# Patient Record
Sex: Female | Born: 1940
Health system: Southern US, Community
[De-identification: ages and names within clinical notes are randomized; demographics above are authoritative.]

## PROBLEM LIST (undated history)

## (undated) DIAGNOSIS — I10 Essential (primary) hypertension: Secondary | ICD-10-CM

## (undated) DIAGNOSIS — K219 Gastro-esophageal reflux disease without esophagitis: Secondary | ICD-10-CM

## (undated) DIAGNOSIS — M1990 Unspecified osteoarthritis, unspecified site: Secondary | ICD-10-CM

## (undated) DIAGNOSIS — E7800 Pure hypercholesterolemia, unspecified: Secondary | ICD-10-CM

## (undated) DIAGNOSIS — R109 Unspecified abdominal pain: Secondary | ICD-10-CM

## (undated) HISTORY — PX: ABDOMINAL HYSTERECTOMY: SHX81

## (undated) HISTORY — PX: APPENDECTOMY: SHX54

---

## 1998-03-07 ENCOUNTER — Ambulatory Visit: Admission: RE | Admit: 1998-03-07 | Discharge: 1998-03-07 | Payer: Self-pay | Admitting: Obstetrics and Gynecology

## 2000-02-01 ENCOUNTER — Encounter: Payer: Self-pay | Admitting: Emergency Medicine

## 2000-02-01 ENCOUNTER — Emergency Department (HOSPITAL_COMMUNITY): Admission: EM | Admit: 2000-02-01 | Discharge: 2000-02-01 | Payer: Self-pay | Admitting: Emergency Medicine

## 2000-02-27 ENCOUNTER — Encounter: Payer: Self-pay | Admitting: Obstetrics and Gynecology

## 2000-02-27 ENCOUNTER — Encounter: Admission: RE | Admit: 2000-02-27 | Discharge: 2000-02-27 | Payer: Self-pay | Admitting: Obstetrics and Gynecology

## 2000-04-29 ENCOUNTER — Encounter: Payer: Self-pay | Admitting: Obstetrics and Gynecology

## 2000-04-29 ENCOUNTER — Encounter: Admission: RE | Admit: 2000-04-29 | Discharge: 2000-04-29 | Payer: Self-pay | Admitting: Obstetrics and Gynecology

## 2000-12-05 ENCOUNTER — Emergency Department (HOSPITAL_COMMUNITY): Admission: EM | Admit: 2000-12-05 | Discharge: 2000-12-05 | Payer: Self-pay | Admitting: *Deleted

## 2000-12-05 ENCOUNTER — Encounter: Payer: Self-pay | Admitting: *Deleted

## 2001-05-12 ENCOUNTER — Encounter: Admission: RE | Admit: 2001-05-12 | Discharge: 2001-05-12 | Payer: Self-pay | Admitting: Obstetrics and Gynecology

## 2001-05-12 ENCOUNTER — Encounter: Payer: Self-pay | Admitting: Obstetrics and Gynecology

## 2001-11-27 ENCOUNTER — Other Ambulatory Visit: Admission: RE | Admit: 2001-11-27 | Discharge: 2001-11-27 | Payer: Self-pay | Admitting: Family Medicine

## 2002-05-11 ENCOUNTER — Encounter: Admission: RE | Admit: 2002-05-11 | Discharge: 2002-05-11 | Payer: Self-pay | Admitting: Family Medicine

## 2002-05-11 ENCOUNTER — Encounter: Payer: Self-pay | Admitting: Family Medicine

## 2003-05-19 ENCOUNTER — Encounter: Payer: Self-pay | Admitting: Family Medicine

## 2003-05-19 ENCOUNTER — Encounter: Admission: RE | Admit: 2003-05-19 | Discharge: 2003-05-19 | Payer: Self-pay | Admitting: Family Medicine

## 2004-03-15 ENCOUNTER — Emergency Department (HOSPITAL_COMMUNITY): Admission: EM | Admit: 2004-03-15 | Discharge: 2004-03-15 | Payer: Self-pay

## 2004-05-22 ENCOUNTER — Encounter: Admission: RE | Admit: 2004-05-22 | Discharge: 2004-05-22 | Payer: Self-pay | Admitting: Family Medicine

## 2005-06-04 ENCOUNTER — Encounter: Admission: RE | Admit: 2005-06-04 | Discharge: 2005-06-04 | Payer: Self-pay | Admitting: Family Medicine

## 2006-07-01 ENCOUNTER — Encounter: Admission: RE | Admit: 2006-07-01 | Discharge: 2006-07-01 | Payer: Self-pay | Admitting: Family Medicine

## 2007-07-03 ENCOUNTER — Encounter: Admission: RE | Admit: 2007-07-03 | Discharge: 2007-07-03 | Payer: Self-pay | Admitting: Family Medicine

## 2008-07-04 ENCOUNTER — Encounter: Admission: RE | Admit: 2008-07-04 | Discharge: 2008-07-04 | Payer: Self-pay | Admitting: Family Medicine

## 2009-07-05 ENCOUNTER — Encounter: Admission: RE | Admit: 2009-07-05 | Discharge: 2009-07-05 | Payer: Self-pay | Admitting: Family Medicine

## 2010-07-11 ENCOUNTER — Encounter: Admission: RE | Admit: 2010-07-11 | Discharge: 2010-07-11 | Payer: Self-pay | Admitting: Family Medicine

## 2011-03-22 NOTE — H&P (Signed)
NAME:  Kelly Farmer, Kelly Farmer                        ACCOUNT NO.:  [id number]   MEDICAL RECORD NO.:  [id number]                   PATIENT TYPE:  EMS   LOCATION:  [facility]                                 FACILITY:  [facility]   PHYSICIAN:  Pricilla Riffle, M.D.                 DATE OF BIRTH:  01-03-1941   DATE OF ADMISSION:  03/15/2004  DATE OF DISCHARGE:                                HISTORY & PHYSICAL   IDENTIFICATION:  Kelly Farmer is a 70 year old with no history of coronary  artery disease who presented to the emergency room for evaluation of chest  pain.   Yesterday, the patient noted the onset of left-sided chest pain.  Denies any  precipitating factors, no extraordinary activity.  It lasted several  minutes, waxed and waned throughout the day.  Nonpleuritic, question mildly  positional, some soreness in right arm.  No real again extraordinary changes  in her daily activities prior.  Slept okay last night, this morning  continued to have intermittent pain and came to the emergency room.   The patient denies any change in her ability to do things.  No increased  dyspnea.   MEDICATIONS:  1. The patient is on Zocor, question 10 mg.  2. Hydrochlorothiazide, question dose.  3. Another blood pressure medicine.  4. Diabetes medicine x2.  5. Vitamins.  6. Aspirin daily.   PAST MEDICAL HISTORY:  1. Diabetes x13 years.  2. Hypertension x2 years.  3. Cholesterol diagnosed and treated for one year.   FAMILY HISTORY:  Coronary artery disease in her father.   SOCIAL HISTORY:  The patient works at [employer].  She quit tobacco years ago,  does not drink.   REVIEW OF SYSTEMS:  Denies fevers, chills.  No cough, no nausea, no  neuropathy from diabetes.  Sugars have been higher now since her medicines  changed.   PHYSICAL EXAMINATION:  GENERAL:  The patient is in no acute distress.  Initially notes chest pain on arrival but then it waxed to 0/10.  VITAL SIGNS:  Blood pressure 112/80, pulse of 70.  The  patient is afebrile.  NECK:  JVP is normal.  No bruits.  LUNGS:  Clear.  CARDIAC:  Regular rate and rhythm.  S1, S2.  No S3, S4, or murmurs or rubs.  CHEST:  Tender on palpation of the left upper parasternal area.  This is the  pain she came in for.  ABDOMEN:  Benign.  EXTREMITIES:  No edema, 2+ pulses.   LABORATORY DATA:  Significant for a hemoglobin of 12.3, WBC of 6.4, BUN and  creatinine of 12 and 0.7, potassium 3.8.  Myoglobin is 54, 54, 45.  MB  1.41/1.2.  Troponin less than 0.05/less than 0.05/less than 0.03.   A 12-lead EKG showed sinus rhythm at a rate of 85 beats per minute  Pricilla Riffle, M.D.    PVR/MEDQ  D:  03/15/2004  T:  03/15/2004  Job:  [id number]

## 2011-06-25 ENCOUNTER — Other Ambulatory Visit: Payer: Self-pay | Admitting: Family Medicine

## 2011-06-25 DIAGNOSIS — Z1231 Encounter for screening mammogram for malignant neoplasm of breast: Secondary | ICD-10-CM

## 2011-06-25 DIAGNOSIS — Z803 Family history of malignant neoplasm of breast: Secondary | ICD-10-CM

## 2011-07-22 ENCOUNTER — Ambulatory Visit: Payer: Self-pay

## 2011-07-31 ENCOUNTER — Ambulatory Visit
Admission: RE | Admit: 2011-07-31 | Discharge: 2011-07-31 | Disposition: A | Discharge status: Home / Self Care | Payer: Medicare Other | Source: Ambulatory Visit | Attending: Family Medicine | Admitting: Family Medicine

## 2011-07-31 DIAGNOSIS — Z803 Family history of malignant neoplasm of breast: Secondary | ICD-10-CM

## 2011-07-31 DIAGNOSIS — Z1231 Encounter for screening mammogram for malignant neoplasm of breast: Secondary | ICD-10-CM

## 2012-01-27 ENCOUNTER — Ambulatory Visit (HOSPITAL_COMMUNITY)
Admission: RE | Admit: 2012-01-27 | Discharge: 2012-01-27 | Disposition: A | Discharge status: Home / Self Care | Payer: Medicare Other | Source: Ambulatory Visit | Attending: Family Medicine | Admitting: Family Medicine

## 2012-01-27 ENCOUNTER — Other Ambulatory Visit (HOSPITAL_COMMUNITY): Payer: Self-pay | Admitting: Family Medicine

## 2012-01-27 DIAGNOSIS — E119 Type 2 diabetes mellitus without complications: Secondary | ICD-10-CM | POA: Insufficient documentation

## 2012-01-27 DIAGNOSIS — I10 Essential (primary) hypertension: Secondary | ICD-10-CM | POA: Diagnosis not present

## 2012-01-27 DIAGNOSIS — R112 Nausea with vomiting, unspecified: Secondary | ICD-10-CM | POA: Diagnosis not present

## 2012-01-27 DIAGNOSIS — R1011 Right upper quadrant pain: Secondary | ICD-10-CM

## 2012-02-10 DIAGNOSIS — K5732 Diverticulitis of large intestine without perforation or abscess without bleeding: Secondary | ICD-10-CM | POA: Diagnosis not present

## 2012-02-13 ENCOUNTER — Emergency Department (HOSPITAL_COMMUNITY): Payer: Medicare Other

## 2012-02-13 ENCOUNTER — Encounter (HOSPITAL_COMMUNITY): Payer: Self-pay | Admitting: *Deleted

## 2012-02-13 ENCOUNTER — Inpatient Hospital Stay (HOSPITAL_COMMUNITY)
Admission: EM | Admit: 2012-02-13 | Discharge: 2012-02-21 | DRG: 445 | Disposition: A | Discharge status: Home Health | Payer: Medicare Other | Attending: Surgery | Admitting: Surgery

## 2012-02-13 DIAGNOSIS — K651 Peritoneal abscess: Secondary | ICD-10-CM | POA: Diagnosis present

## 2012-02-13 DIAGNOSIS — R05 Cough: Secondary | ICD-10-CM | POA: Diagnosis not present

## 2012-02-13 DIAGNOSIS — R110 Nausea: Secondary | ICD-10-CM | POA: Diagnosis not present

## 2012-02-13 DIAGNOSIS — K5730 Diverticulosis of large intestine without perforation or abscess without bleeding: Secondary | ICD-10-CM | POA: Diagnosis not present

## 2012-02-13 DIAGNOSIS — K828 Other specified diseases of gallbladder: Secondary | ICD-10-CM | POA: Diagnosis not present

## 2012-02-13 DIAGNOSIS — N179 Acute kidney failure, unspecified: Secondary | ICD-10-CM | POA: Diagnosis present

## 2012-02-13 DIAGNOSIS — K632 Fistula of intestine: Secondary | ICD-10-CM | POA: Diagnosis present

## 2012-02-13 DIAGNOSIS — R059 Cough, unspecified: Secondary | ICD-10-CM | POA: Diagnosis not present

## 2012-02-13 DIAGNOSIS — R109 Unspecified abdominal pain: Secondary | ICD-10-CM | POA: Diagnosis not present

## 2012-02-13 DIAGNOSIS — M7989 Other specified soft tissue disorders: Secondary | ICD-10-CM | POA: Diagnosis not present

## 2012-02-13 DIAGNOSIS — R112 Nausea with vomiting, unspecified: Secondary | ICD-10-CM | POA: Diagnosis present

## 2012-02-13 DIAGNOSIS — E785 Hyperlipidemia, unspecified: Secondary | ICD-10-CM | POA: Diagnosis present

## 2012-02-13 DIAGNOSIS — N289 Disorder of kidney and ureter, unspecified: Secondary | ICD-10-CM | POA: Diagnosis not present

## 2012-02-13 DIAGNOSIS — R5381 Other malaise: Secondary | ICD-10-CM | POA: Diagnosis not present

## 2012-02-13 DIAGNOSIS — K810 Acute cholecystitis: Principal | ICD-10-CM

## 2012-02-13 DIAGNOSIS — E876 Hypokalemia: Secondary | ICD-10-CM

## 2012-02-13 DIAGNOSIS — E119 Type 2 diabetes mellitus without complications: Secondary | ICD-10-CM | POA: Diagnosis not present

## 2012-02-13 DIAGNOSIS — K659 Peritonitis, unspecified: Secondary | ICD-10-CM | POA: Diagnosis not present

## 2012-02-13 DIAGNOSIS — L03319 Cellulitis of trunk, unspecified: Secondary | ICD-10-CM | POA: Diagnosis not present

## 2012-02-13 DIAGNOSIS — IMO0001 Reserved for inherently not codable concepts without codable children: Secondary | ICD-10-CM | POA: Diagnosis not present

## 2012-02-13 DIAGNOSIS — D72829 Elevated white blood cell count, unspecified: Secondary | ICD-10-CM | POA: Diagnosis not present

## 2012-02-13 DIAGNOSIS — E860 Dehydration: Secondary | ICD-10-CM | POA: Diagnosis not present

## 2012-02-13 DIAGNOSIS — R1110 Vomiting, unspecified: Secondary | ICD-10-CM | POA: Diagnosis not present

## 2012-02-13 DIAGNOSIS — A498 Other bacterial infections of unspecified site: Secondary | ICD-10-CM | POA: Diagnosis present

## 2012-02-13 DIAGNOSIS — I10 Essential (primary) hypertension: Secondary | ICD-10-CM | POA: Diagnosis present

## 2012-02-13 DIAGNOSIS — R1115 Cyclical vomiting syndrome unrelated to migraine: Secondary | ICD-10-CM | POA: Diagnosis not present

## 2012-02-13 DIAGNOSIS — R5383 Other fatigue: Secondary | ICD-10-CM | POA: Diagnosis not present

## 2012-02-13 DIAGNOSIS — K311 Adult hypertrophic pyloric stenosis: Secondary | ICD-10-CM | POA: Diagnosis not present

## 2012-02-13 DIAGNOSIS — L02219 Cutaneous abscess of trunk, unspecified: Secondary | ICD-10-CM | POA: Diagnosis not present

## 2012-02-13 DIAGNOSIS — K829 Disease of gallbladder, unspecified: Secondary | ICD-10-CM | POA: Diagnosis not present

## 2012-02-13 DIAGNOSIS — R197 Diarrhea, unspecified: Secondary | ICD-10-CM

## 2012-02-13 HISTORY — DX: Unspecified osteoarthritis, unspecified site: M19.90

## 2012-02-13 HISTORY — DX: Pure hypercholesterolemia, unspecified: E78.00

## 2012-02-13 HISTORY — DX: Essential (primary) hypertension: I10

## 2012-02-13 LAB — TROPONIN I: Troponin I: <0.3 ng/mL (ref <0.30)

## 2012-02-13 LAB — URINALYSIS, ROUTINE W REFLEX MICROSCOPIC
Glucose, UA: NEGATIVE mg/dL
Hgb urine dipstick: NEGATIVE
Ketones, ur: 15 mg/dL — AB
Nitrite: NEGATIVE
Protein, ur: 30 mg/dL — AB
Specific Gravity, Urine: 1.024 (ref 1.005–1.030)
Urobilinogen, UA: 1 mg/dL (ref 0.0–1.0)
pH: 5.5 (ref 5.0–8.0)

## 2012-02-13 LAB — GLUCOSE, CAPILLARY
Glucose-Capillary: 100 mg/dL — ABNORMAL HIGH (ref 70–99)
Glucose-Capillary: 106 mg/dL — ABNORMAL HIGH (ref 70–99)
Glucose-Capillary: 167 mg/dL — ABNORMAL HIGH (ref 70–99)

## 2012-02-13 LAB — CBC
HCT: 31.3 % — ABNORMAL LOW (ref 36.0–46.0)
Hemoglobin: 10.1 g/dL — ABNORMAL LOW (ref 12.0–15.0)
MCH: 25.7 pg — ABNORMAL LOW (ref 26.0–34.0)
MCHC: 32.3 g/dL (ref 30.0–36.0)
MCV: 79.6 fL (ref 78.0–100.0)
Platelets: 652 10*3/uL — ABNORMAL HIGH (ref 150–400)
RBC: 3.93 MIL/uL (ref 3.87–5.11)
RDW: 15.9 % — ABNORMAL HIGH (ref 11.5–15.5)
WBC: 10.6 10*3/uL — ABNORMAL HIGH (ref 4.0–10.5)

## 2012-02-13 LAB — DIFFERENTIAL
Basophils Absolute: 0 10*3/uL (ref 0.0–0.1)
Basophils Relative: 0 % (ref 0–1)
Eosinophils Absolute: 0 10*3/uL (ref 0.0–0.7)
Eosinophils Relative: 0 % (ref 0–5)
Lymphocytes Relative: 12 % (ref 12–46)
Lymphs Abs: 1.3 10*3/uL (ref 0.7–4.0)
Monocytes Absolute: 1.7 10*3/uL — ABNORMAL HIGH (ref 0.1–1.0)
Monocytes Relative: 16 % — ABNORMAL HIGH (ref 3–12)
Neutro Abs: 7.6 10*3/uL (ref 1.7–7.7)
Neutrophils Relative %: 71 % (ref 43–77)

## 2012-02-13 LAB — URINE MICROSCOPIC-ADD ON

## 2012-02-13 LAB — COMPREHENSIVE METABOLIC PANEL
ALT: 24 U/L (ref 0–35)
AST: 20 U/L (ref 0–37)
Albumin: 3 g/dL — ABNORMAL LOW (ref 3.5–5.2)
Alkaline Phosphatase: 140 U/L — ABNORMAL HIGH (ref 39–117)
BUN: 45 mg/dL — ABNORMAL HIGH (ref 6–23)
CO2: 38 mEq/L — ABNORMAL HIGH (ref 19–32)
Calcium: 9.5 mg/dL (ref 8.4–10.5)
Chloride: 79 mEq/L — ABNORMAL LOW (ref 96–112)
Creatinine, Ser: 3.74 mg/dL — ABNORMAL HIGH (ref 0.50–1.10)
GFR calc Af Amer: 13 mL/min — ABNORMAL LOW (ref >90)
GFR calc non Af Amer: 11 mL/min — ABNORMAL LOW (ref >90)
Glucose, Bld: 208 mg/dL — ABNORMAL HIGH (ref 70–99)
Potassium: 2.7 mEq/L — CL (ref 3.5–5.1)
Sodium: 138 mEq/L (ref 135–145)
Total Bilirubin: 0.6 mg/dL (ref 0.3–1.2)
Total Protein: 8.1 g/dL (ref 6.0–8.3)

## 2012-02-13 LAB — LIPASE, BLOOD: Lipase: 42 U/L (ref 11–59)

## 2012-02-13 LAB — PROCALCITONIN: Procalcitonin: 0.47 ng/mL

## 2012-02-13 LAB — LACTIC ACID, PLASMA: Lactic Acid, Venous: 2.4 mmol/L — ABNORMAL HIGH (ref 0.5–2.2)

## 2012-02-13 MED ORDER — SODIUM CHLORIDE 0.9 % IV SOLN
1.0000 g | Freq: Once | INTRAVENOUS | Status: AC
  Administered 2012-02-13: 1 g via INTRAVENOUS
  Filled 2012-02-13: qty 1

## 2012-02-13 MED ORDER — SODIUM CHLORIDE 0.9 % IV BOLUS (SEPSIS)
500.0000 mL | Freq: Once | INTRAVENOUS | Status: AC
  Administered 2012-02-14: 500 mL via INTRAVENOUS

## 2012-02-13 MED ORDER — WHITE PETROLATUM GEL
Status: AC
  Filled 2012-02-13: qty 5

## 2012-02-13 MED ORDER — ONDANSETRON HCL 4 MG/2ML IJ SOLN
4.0000 mg | INTRAMUSCULAR | Status: AC | PRN
  Administered 2012-02-13 – 2012-02-14 (×2): 4 mg via INTRAVENOUS
  Filled 2012-02-13 (×2): qty 2

## 2012-02-13 MED ORDER — INSULIN ASPART 100 UNIT/ML ~~LOC~~ SOLN
0.0000 [IU] | SUBCUTANEOUS | Status: DC
  Administered 2012-02-13: 20:00:00 via SUBCUTANEOUS
  Administered 2012-02-14 (×2): 1 [IU] via SUBCUTANEOUS
  Administered 2012-02-15 (×2): 2 [IU] via SUBCUTANEOUS
  Administered 2012-02-15: 1 [IU] via SUBCUTANEOUS
  Administered 2012-02-15: 3 [IU] via SUBCUTANEOUS
  Administered 2012-02-16: 1 [IU] via SUBCUTANEOUS
  Filled 2012-02-13: qty 1

## 2012-02-13 MED ORDER — SODIUM CHLORIDE 0.9 % IV SOLN
250.0000 mg | Freq: Two times a day (BID) | INTRAVENOUS | Status: DC
  Administered 2012-02-14: 250 mg via INTRAVENOUS
  Filled 2012-02-13 (×6): qty 250

## 2012-02-13 MED ORDER — POTASSIUM CHLORIDE 10 MEQ/100ML IV SOLN
10.0000 meq | INTRAVENOUS | Status: AC
  Administered 2012-02-13 – 2012-02-14 (×4): 10 meq via INTRAVENOUS
  Filled 2012-02-13 (×3): qty 100

## 2012-02-13 MED ORDER — SODIUM CHLORIDE 0.9 % IV SOLN
INTRAVENOUS | Status: DC
  Administered 2012-02-13: 22:00:00 via INTRAVENOUS
  Administered 2012-02-14: 1000 mL via INTRAVENOUS
  Administered 2012-02-14: 500 mL via INTRAVENOUS
  Administered 2012-02-15: 1000 mL via INTRAVENOUS

## 2012-02-13 NOTE — Progress Notes (Signed)
ANTIBIOTIC CONSULT NOTE - INITIAL  Pharmacy Consult for Primaxin Indication: perforated cholecystitis  No Known Allergies  Patient Measurements: Weight: 156 lb 3.2 oz (70.852 kg)  Vital Signs: Temp: 98 F (36.7 C) (04/11 1702) Temp src: Oral (04/11 1702) BP: 112/50 mmHg (04/11 1702) Pulse Rate: 92  (04/11 1702) Intake/Output from previous day:   Intake/Output from this shift:    Labs:  Basename 02/13/12 1415  WBC 10.6*  HGB 10.1*  PLT 652*  LABCREA --  CREATININE 3.74*   CrCl is unknown because there is no height on file for the current visit. No results found for this basename: VANCOTROUGH:2,VANCOPEAK:2,VANCORANDOM:2,GENTTROUGH:2,GENTPEAK:2,GENTRANDOM:2,TOBRATROUGH:2,TOBRAPEAK:2,TOBRARND:2,AMIKACINPEAK:2,AMIKACINTROU:2,AMIKACIN:2, in the last 72 hours   Microbiology: No results found for this or any previous visit (from the past 720 hour(s)).  Medical History: Past Medical History  Diagnosis Date  . Hypercholesteremia   . Diabetes mellitus   . Hypertension     Medications:  Scheduled:    . ertapenem  1 g Intravenous Once  . insulin aspart  0-9 Units Subcutaneous Q4H  . potassium chloride  10 mEq Intravenous Q1 Hr x 5   Infusions:    . sodium chloride     Assessment: 71 yo female with perforated cholecystitis to start Primaxin per pharmacy. Note that patient just got Invanz 1g at 1730. Patient with an estimated normalized CrCl = 16 ml/min/1.72m2  Goal of Therapy:  primaxin adjustment per renal function  Plan:  1. Start Primaxin 250mg  IV q12 for CrCl 6-20 ml/min/1.28m2 2. Monitor renal function for potential dose changes   [name], PharmD, BCPS pager [phone] 02/13/2012,5:51 PM

## 2012-02-13 NOTE — ED Notes (Signed)
Pt states "I been sick vomiting since 3/25, went to the walk-in clinic this morning, was supposed to go to the gastro doctor Monday but I said forget that, I'm just sick"

## 2012-02-13 NOTE — Consult Note (Addendum)
Requesting physician: Dr.STreck  Primary Care Physician: Sissy Hoff, MD, MD  Reason for consultation: Medical management   History of Present Illness: Kelly Farmer is a 70/F with h/o Dm, HTN presents to ER today wih a 2 week h/o nausea and vomiting associated with right sided abd pain. When her symptoms started the was seen by her PCP who ordered a RUQ USG which was normal. She continued to have persistent nausea and vomiting associated with intermittent right sided abdominal pain and very poor PO intake . CT in ER concerning for perforated cholecystitis with abscess.Per EDP will likely to  OR per [facility] today. [facility] consulted for medical evaluation. No h/o CAD, no h/o exertional Chest pain or Dyspnea  Allergies:  No Known Allergies    Past Medical History  Diagnosis Date  . Hypercholesteremia   . Diabetes mellitus   . Hypertension     Past Surgical History  Procedure Date  . Abdominal hysterectomy     Scheduled Meds:   . ertapenem  1 g Intravenous Once  . insulin aspart  0-9 Units Subcutaneous Q4H   Continuous Infusions:   . sodium chloride     PRN Meds:.ondansetron  Social History: lives at home with significant other, former smoker quit 30 years ago, no h/o ETOH use   No family history on file.  Review of Systems:  Constitutional: Denies fever, chills, diaphoresis, appetite change and fatigue.  HEENT: Denies photophobia, eye pain, redness, hearing loss, ear pain, congestion, sore throat, rhinorrhea, sneezing, mouth sores, trouble swallowing, neck pain, neck stiffness and tinnitus.   Respiratory: Denies SOB, DOE, cough, chest tightness,  and wheezing.   Cardiovascular: Denies chest pain, palpitations and leg swelling.  Gastrointestinal: Denies nausea, vomiting, abdominal pain, diarrhea, constipation, blood in stool and abdominal distention.  Genitourinary: Denies dysuria, urgency, frequency, hematuria, flank pain and difficulty urinating.    Musculoskeletal: Denies myalgias, back pain, joint swelling, arthralgias and gait problem.  Skin: Denies pallor, rash and wound.  Neurological: Denies dizziness, seizures, syncope, weakness, light-headedness, numbness and headaches.  Hematological: Denies adenopathy. Easy bruising, personal or family bleeding history  Psychiatric/Behavioral: Denies suicidal ideation, mood changes, confusion, nervousness, sleep disturbance and agitation   Physical Exam: Blood pressure 112/50, pulse 92, temperature 98 F (36.7 C), temperature source Oral, resp. rate 20, weight 70.852 kg (156 lb 3.2 oz), SpO2 96.00%. WGN:[id number] no distress HEENT: oral mucosa dry, no JVD CVS: S1S2/RRR, no m/r/g Abd: Soft, distended, mild diffuse tenderness increased in RUQ No rigidity or rebound, decreased BS Ext: no edema or clubbing  Labs on Admission:  Results for orders placed during the hospital encounter of 02/13/12 (from the past 48 hour(s))  CBC     Status: Abnormal   Collection Time   02/13/12  2:15 PM      Component Value Range Comment   WBC 10.6 (*) 4.0 - 10.5 (K/uL)    RBC 3.93  3.87 - 5.11 (MIL/uL)    Hemoglobin 10.1 (*) 12.0 - 15.0 (g/dL)    HCT 08.6 (*) 57.8 - 46.0 (%)    MCV 79.6  78.0 - 100.0 (fL)    MCH 25.7 (*) 26.0 - 34.0 (pg)    MCHC 32.3  30.0 - 36.0 (g/dL)    RDW 46.9 (*) 62.9 - 15.5 (%)    Platelets 652 (*) 150 - 400 (K/uL)   DIFFERENTIAL     Status: Abnormal   Collection Time   02/13/12  2:15 PM      Component Value Range Comment  Neutrophils Relative 71  43 - 77 (%)    Neutro Abs 7.6  1.7 - 7.7 (K/uL)    Lymphocytes Relative 12  12 - 46 (%)    Lymphs Abs 1.3  0.7 - 4.0 (K/uL)    Monocytes Relative 16 (*) 3 - 12 (%)    Monocytes Absolute 1.7 (*) 0.1 - 1.0 (K/uL)    Eosinophils Relative 0  0 - 5 (%)    Eosinophils Absolute 0.0  0.0 - 0.7 (K/uL)    Basophils Relative 0  0 - 1 (%)    Basophils Absolute 0.0  0.0 - 0.1 (K/uL)   COMPREHENSIVE METABOLIC PANEL     Status: Abnormal    Collection Time   02/13/12  2:15 PM      Component Value Range Comment   Sodium 138  135 - 145 (mEq/L)    Potassium 2.7 (*) 3.5 - 5.1 (mEq/L)    Chloride 79 (*) 96 - 112 (mEq/L)    CO2 38 (*) 19 - 32 (mEq/L)    Glucose, Bld 208 (*) 70 - 99 (mg/dL)    BUN 45 (*) 6 - 23 (mg/dL)    Creatinine, Ser 1.61 (*) 0.50 - 1.10 (mg/dL)    Calcium 9.5  8.4 - 10.5 (mg/dL)    Total Protein 8.1  6.0 - 8.3 (g/dL)    Albumin 3.0 (*) 3.5 - 5.2 (g/dL)    AST 20  0 - 37 (U/L)    ALT 24  0 - 35 (U/L)    Alkaline Phosphatase 140 (*) 39 - 117 (U/L)    Total Bilirubin 0.6  0.3 - 1.2 (mg/dL)    GFR calc non Af Amer 11 (*) >90 (mL/min)    GFR calc Af Amer 13 (*) >90 (mL/min)   LIPASE, BLOOD     Status: Normal   Collection Time   02/13/12  2:15 PM      Component Value Range Comment   Lipase 42  11 - 59 (U/L)   PROCALCITONIN     Status: Normal   Collection Time   02/13/12  2:15 PM      Component Value Range Comment   Procalcitonin 0.47     TROPONIN I     Status: Normal   Collection Time   02/13/12  2:15 PM      Component Value Range Comment   Troponin I <0.30  <0.30 (ng/mL)   LACTIC ACID, PLASMA     Status: Abnormal   Collection Time   02/13/12  2:33 PM      Component Value Range Comment   Lactic Acid, Venous 2.4 (*) 0.5 - 2.2 (mmol/L)   URINALYSIS, ROUTINE W REFLEX MICROSCOPIC     Status: Abnormal   Collection Time   02/13/12  3:06 PM      Component Value Range Comment   Color, Urine AMBER (*) YELLOW  BIOCHEMICALS MAY BE AFFECTED BY COLOR   APPearance TURBID (*) CLEAR     Specific Gravity, Urine 1.024  1.005 - 1.030     pH 5.5  5.0 - 8.0     Glucose, UA NEGATIVE  NEGATIVE (mg/dL)    Hgb urine dipstick NEGATIVE  NEGATIVE     Bilirubin Urine LARGE (*) NEGATIVE     Ketones, ur 15 (*) NEGATIVE (mg/dL)    Protein, ur 30 (*) NEGATIVE (mg/dL)    Urobilinogen, UA 1.0  0.0 - 1.0 (mg/dL)    Nitrite NEGATIVE  NEGATIVE     Leukocytes, UA  TRACE (*) NEGATIVE    URINE MICROSCOPIC-ADD ON     Status: Abnormal    Collection Time   02/13/12  3:06 PM      Component Value Range Comment   Squamous Epithelial / LPF RARE  RARE     WBC, UA 0-2  <3 (WBC/hpf)    RBC / HPF 0-2  <3 (RBC/hpf)    Bacteria, UA MANY (*) RARE     Casts HYALINE CASTS (*) NEGATIVE      Radiological Exams on Admission: Ct Abdomen Pelvis Wo Contrast  02/13/2012  *RADIOLOGY REPORT*  Clinical Data: Vomiting for 3 weeks. Renal insufficiency.  Mild leukocytosis.  CT ABDOMEN AND PELVIS WITHOUT CONTRAST  Technique:  Multidetector CT imaging of the abdomen and pelvis was performed following the standard protocol without intravenous contrast.  Comparison: Abdominal ultrasound 01/27/2012.  Findings: There is mild atelectasis at both lung bases.  No significant pleural effusion is identified.  The gallbladder is diffusely abnormal, markedly distended and with ill-defined margins.  It measures approximately 8.7 x 5.9 cm transverse.  On the ultrasound performed less than 3 weeks ago, there was only mild gallbladder wall thickening.  There is multifocal adjacent soft tissue nodularity extending inferiorly to the hepatic flexure of the colon and medially to the gastric pylorus.  There appears to be some resulting gastric outlet obstruction, and no contrast enters the small or large bowel.  No focal hepatic lesions are identified.  The spleen, pancreas, adrenal glands and kidneys appear normal aside from a tiny nonobstructing calculus in the lower pole of the left kidney. There is no hydronephrosis.  The remainder the colon appears normal aside from sigmoid colon diverticular changes. The appendix is not seen - question prior appendectomy.  There is no generalized ascites or peritoneal nodularity.  The uterus is surgically absent.  There is no adnexal mass.  Aorto iliac atherosclerosis and lumbar spondylosis are noted.  IMPRESSION:  1.  Grossly abnormal process in the right upper quadrant involving the gallbladder, hepatic flexure of the colon and gastric pylorus.  The nodularity and mass effect are concerning for a possible neoplastic process.  However, in light of the relatively normal recent ultrasound findings, this presumably represents acute cholecystitis with possible perforation and surrounding abscess formation. Surgical consultation recommended. 2.  Suspected associated gastric outlet obstruction. 3.  No evidence of hydronephrosis. 4.  Mild bibasilar atelectasis.  These results were called by telephone on 02/13/2012  at  1630 hours to  Dr. Clarene Duke, who verbally acknowledged these results.  Original Report Authenticated By: Gerrianne Scale, M.D.   Dg Chest 2 View  02/13/2012  *RADIOLOGY REPORT*  Clinical Data: Cough, weakness, nausea  CHEST - 2 VIEW  Comparison: Portable chest x-ray of 03/15/2004  Findings: Linear atelectasis or scarring is noted at the lung bases right greater than left.  No definite pneumonia or effusion is seen.  The heart is mildly enlarged.  The bones are osteopenic with degenerative change.  IMPRESSION:   Bibasilar linear atelectasis or scarring right greater than left. Stable cardiomegaly.  No definite active process.  Original Report Authenticated By: Juline Patch, M.D.    Assessment/Plan 1. Perforated cholecystitis w/ abscess: per EDP, plan for emergency surgery per [facility] Add imipenem 2. Acute renal failure : suspect related to ATN, dehydration and being on ARB, metformin and diuretic. Stop offending meds Aggressive IVF,CT without evidence of obstruction Strict 1/Os 3. DM: Stop metformin, Novolog SSI Q4h since she will be NPO 4. HTN: stable, no  need for antihypertensives acutely, now. 5. Hypokalemia: replace 6. Periop-cardiac risk: low to moderate, negative myoview in 2005. Asymptomatic and no acute EKG changes, no further workup warranted at this time.   Time Spent on Consultation:  Chaise Mahabir [facility]  [phone] 02/13/2012, 5:44 PM

## 2012-02-13 NOTE — ED Notes (Signed)
MD at bedside. 

## 2012-02-13 NOTE — ED Provider Notes (Signed)
History     CSN: [id number]  Arrival date & time 02/13/12  1257   First MD Initiated Contact with Patient 02/13/12 1321      Chief Complaint  Patient presents with  . Abdominal Pain  . Emesis    HPI Pt was seen at 1340.  Per pt, c/o gradual onset and persistence of multiple intermittent episodes of N/V/D for the past 2-3 weeks, worse since last night.  Has been assoc with right sided abd "pain."  Pt states she was eval by her PMD for same on 01/27/12, had [place] RUQ performed, dx possible diverticulitis, rx PO abx and antiemetics.  Pt states she has not been able to take the abx due to her N/V.  Denies fevers, no rash, no CP/SOB, no cough, no back pain, no black or blood in stools or emesis.      Past Medical History  Diagnosis Date  . Hypercholesteremia   . Diabetes mellitus   . Hypertension     Past Surgical History  Procedure Date  . Abdominal hysterectomy     History  Substance Use Topics  . Smoking status: Former Games developer  . Smokeless tobacco: Not on file  . Alcohol Use: No    Review of Systems ROS: Statement: All systems negative except as marked or noted in the HPI; Constitutional: Negative for fever and chills. ; ; Eyes: Negative for eye pain, redness and discharge. ; ; ENMT: Negative for ear pain, hoarseness, nasal congestion, sinus pressure and sore throat. ; ; Cardiovascular: Negative for chest pain, palpitations, diaphoresis, dyspnea and peripheral edema. ; ; Respiratory: Negative for cough, wheezing and stridor. ; ; Gastrointestinal: +N/V/D, abd pain.  Negative for blood in stool, hematemesis, jaundice and rectal bleeding. . ; ; Genitourinary: Negative for dysuria, flank pain and hematuria. ; ; Musculoskeletal: Negative for back pain and neck pain. Negative for swelling and trauma.; ; Skin: Negative for pruritus, rash, abrasions, blisters, bruising and skin lesion.; ; Neuro: Negative for headache, lightheadedness and neck stiffness. Negative for weakness, altered level of  consciousness , altered mental status, extremity weakness, paresthesias, involuntary movement, seizure and syncope.      Allergies  Review of patient's allergies indicates not on file.  Home Medications  No current outpatient prescriptions on file.  BP 127/48  Pulse 97  Temp(Src) 97.5 F (36.4 C) (Oral)  Resp 20  Wt 156 lb 3.2 oz (70.852 kg)  SpO2 97%  Physical Exam 1345: Physical examination:  Nursing notes reviewed; Vital signs and O2 SAT reviewed;  Constitutional: Well developed, Well nourished, In no acute distress; Head:  Normocephalic, atraumatic; Eyes: EOMI, PERRL, No scleral icterus; ENMT: Mouth and pharynx normal, Mucous membranes dry; Neck: Supple, Full range of motion, No lymphadenopathy; Cardiovascular: Regular rate and rhythm, No murmur, rub, or gallop; Respiratory: Breath sounds clear & equal bilaterally, No rales, rhonchi, wheezes, or rub, Normal respiratory effort/excursion; Chest: Nontender, Movement normal; Abdomen: Soft, +diffuse tenderness to palp, esp right side of abd, no rebound or guarding. Nondistended, Normal bowel sounds; Extremities: Pulses normal, No tenderness, No edema, No calf edema or asymmetry.; Neuro: AA&Ox3, Major CN grossly intact.  No gross focal motor or sensory deficits in extremities.; Skin: Color normal, Warm, Dry, no rash.   ED Course  Procedures   MDM  MDM Reviewed: nursing note, vitals and previous chart Interpretation: labs, ECG, x-ray and CT scan      Date: 02/13/2012  Rate: 89  Rhythm: normal sinus rhythm  QRS Axis: normal  Intervals: normal  ST/T Wave abnormalities: nonspecific T wave changes  Conduction Disutrbances:none  Narrative Interpretation:   Old EKG Reviewed: unchanged; no significant changes from previous EKG dated 03/15/2004.  Results for orders placed during the hospital encounter of 02/13/12  CBC      Component Value Range   WBC 10.6 (*) 4.0 - 10.5 (K/uL)   RBC 3.93  3.87 - 5.11 (MIL/uL)   Hemoglobin 10.1 (*)  12.0 - 15.0 (g/dL)   HCT 40.9 (*) 81.1 - 46.0 (%)   MCV 79.6  78.0 - 100.0 (fL)   MCH 25.7 (*) 26.0 - 34.0 (pg)   MCHC 32.3  30.0 - 36.0 (g/dL)   RDW 91.4 (*) 78.2 - 15.5 (%)   Platelets 652 (*) 150 - 400 (K/uL)  DIFFERENTIAL      Component Value Range   Neutrophils Relative 71  43 - 77 (%)   Neutro Abs 7.6  1.7 - 7.7 (K/uL)   Lymphocytes Relative 12  12 - 46 (%)   Lymphs Abs 1.3  0.7 - 4.0 (K/uL)   Monocytes Relative 16 (*) 3 - 12 (%)   Monocytes Absolute 1.7 (*) 0.1 - 1.0 (K/uL)   Eosinophils Relative 0  0 - 5 (%)   Eosinophils Absolute 0.0  0.0 - 0.7 (K/uL)   Basophils Relative 0  0 - 1 (%)   Basophils Absolute 0.0  0.0 - 0.1 (K/uL)  COMPREHENSIVE METABOLIC PANEL      Component Value Range   Sodium 138  135 - 145 (mEq/L)   Potassium 2.7 (*) 3.5 - 5.1 (mEq/L)   Chloride 79 (*) 96 - 112 (mEq/L)   CO2 38 (*) 19 - 32 (mEq/L)   Glucose, Bld 208 (*) 70 - 99 (mg/dL)   BUN 45 (*) 6 - 23 (mg/dL)   Creatinine, Ser 9.56 (*) 0.50 - 1.10 (mg/dL)   Calcium 9.5  8.4 - 21.3 (mg/dL)   Total Protein 8.1  6.0 - 8.3 (g/dL)   Albumin 3.0 (*) 3.5 - 5.2 (g/dL)   AST 20  0 - 37 (U/L)   ALT 24  0 - 35 (U/L)   Alkaline Phosphatase 140 (*) 39 - 117 (U/L)   Total Bilirubin 0.6  0.3 - 1.2 (mg/dL)   GFR calc non Af Amer 11 (*) >90 (mL/min)   GFR calc Af Amer 13 (*) >90 (mL/min)  LIPASE, BLOOD      Component Value Range   Lipase 42  11 - 59 (U/L)  LACTIC ACID, PLASMA      Component Value Range   Lactic Acid, Venous 2.4 (*) 0.5 - 2.2 (mmol/L)  PROCALCITONIN      Component Value Range   Procalcitonin 0.47    URINALYSIS, ROUTINE W REFLEX MICROSCOPIC      Component Value Range   Color, Urine AMBER (*) YELLOW    APPearance TURBID (*) CLEAR    Specific Gravity, Urine 1.024  1.005 - 1.030    pH 5.5  5.0 - 8.0    Glucose, UA NEGATIVE  NEGATIVE (mg/dL)   Hgb urine dipstick NEGATIVE  NEGATIVE    Bilirubin Urine LARGE (*) NEGATIVE    Ketones, ur 15 (*) NEGATIVE (mg/dL)   Protein, ur 30 (*) NEGATIVE  (mg/dL)   Urobilinogen, UA 1.0  0.0 - 1.0 (mg/dL)   Nitrite NEGATIVE  NEGATIVE    Leukocytes, UA TRACE (*) NEGATIVE   TROPONIN I      Component Value Range   Troponin I <0.30  <0.30 (ng/mL)  URINE  MICROSCOPIC-ADD ON      Component Value Range   Squamous Epithelial / LPF RARE  RARE    WBC, UA 0-2  <3 (WBC/hpf)   RBC / HPF 0-2  <3 (RBC/hpf)   Bacteria, UA MANY (*) RARE    Casts HYALINE CASTS (*) NEGATIVE    Ct Abdomen Pelvis Wo Contrast 02/13/2012  *RADIOLOGY REPORT*  Clinical Data: Vomiting for 3 weeks. Renal insufficiency.  Mild leukocytosis.  CT ABDOMEN AND PELVIS WITHOUT CONTRAST  Technique:  Multidetector CT imaging of the abdomen and pelvis was performed following the standard protocol without intravenous contrast.  Comparison: Abdominal ultrasound 01/27/2012.  Findings: There is mild atelectasis at both lung bases.  No significant pleural effusion is identified.  The gallbladder is diffusely abnormal, markedly distended and with ill-defined margins.  It measures approximately 8.7 x 5.9 cm transverse.  On the ultrasound performed less than 3 weeks ago, there was only mild gallbladder wall thickening.  There is multifocal adjacent soft tissue nodularity extending inferiorly to the hepatic flexure of the colon and medially to the gastric pylorus.  There appears to be some resulting gastric outlet obstruction, and no contrast enters the small or large bowel.  No focal hepatic lesions are identified.  The spleen, pancreas, adrenal glands and kidneys appear normal aside from a tiny nonobstructing calculus in the lower pole of the left kidney. There is no hydronephrosis.  The remainder the colon appears normal aside from sigmoid colon diverticular changes. The appendix is not seen - question prior appendectomy.  There is no generalized ascites or peritoneal nodularity.  The uterus is surgically absent.  There is no adnexal mass.  Aorto iliac atherosclerosis and lumbar spondylosis are noted.  IMPRESSION:   1.  Grossly abnormal process in the right upper quadrant involving the gallbladder, hepatic flexure of the colon and gastric pylorus. The nodularity and mass effect are concerning for a possible neoplastic process.  However, in light of the relatively normal recent ultrasound findings, this presumably represents acute cholecystitis with possible perforation and surrounding abscess formation. Surgical consultation recommended. 2.  Suspected associated gastric outlet obstruction. 3.  No evidence of hydronephrosis. 4.  Mild bibasilar atelectasis.  These results were called by telephone on 02/13/2012  at  1630 hours to  Dr. Clarene Duke, who verbally acknowledged these results.  Original Report Authenticated By: Gerrianne Scale, M.D.   Dg Chest 2 View 02/13/2012  *RADIOLOGY REPORT*  Clinical Data: Cough, weakness, nausea  CHEST - 2 VIEW  Comparison: Portable chest x-ray of 03/15/2004  Findings: Linear atelectasis or scarring is noted at the lung bases right greater than left.  No definite pneumonia or effusion is seen.  The heart is mildly enlarged.  The bones are osteopenic with degenerative change.  IMPRESSION:   Bibasilar linear atelectasis or scarring right greater than left. Stable cardiomegaly.  No definite active process.  Original Report Authenticated By: Juline Patch, M.D.   US Abdomen Complete 01/27/2012  *RADIOLOGY REPORT*  Clinical Data:  71 year old female with right upper quadrant abdominal pain, nausea and vomiting.  Hypertension and diabetes.  COMPLETE ABDOMINAL ULTRASOUND  Comparison:  None.  Findings:  Gallbladder:  Gallbladder wall thickness is at the upper limits of normal, 3 mm.  No sludge or shadowing echogenic stones.  No sonographic Murphy's sign elicited.  No pericholecystic fluid.  Common bile duct:  Normal measuring 5 mm in diameter.  Liver:  No focal lesion identified.  Within normal limits in parenchymal echogenicity.  IVC:  Appears normal.  Pancreas:  Incompletely visualized due to  overlying bowel gas, visualized portions within normal limits.  Spleen:  Normal measuring 4.7 cm in length.  Right Kidney:  Normal measuring 9.8 cm in length.  Left Kidney:  Normal measuring 10.1 cm in length.  Abdominal aorta:  No aneurysm identified.  IMPRESSION: Negative gallbladder.  Abdominal ultrasound is within normal limits.  Original Report Authenticated By: Harley Hallmark, M.D.     1655:   Likely acute cholecystitis, will start IV abx, keep NPO.  Dx testing d/w pt and family.  Questions answered.  Verb understanding, agreeable to admit.  T/C to General Surgeon Dr. Jamey Ripa, case discussed, including:  HPI, pertinent PM/SHx, VS/PE, dx testing, ED course and treatment:  Agreeable to admit, requests to have Hospitalist consult re: ARF, hypokalemia, chronic medical issues.    5:07 PM:  T/C to [facility] Dr. Jomarie Longs, case discussed, including:  HPI, pertinent PM/SHx, VS/PE, dx testing, ED course and treatment:  Agreeable to consult in the ED.        Laray Anger, DO 02/14/12 2020

## 2012-02-13 NOTE — H&P (Signed)
Kelly Farmer is an 71 y.o. female.    Chief Complaint: 3 week hx of nausea, emesis, diarrhea  HPI: patient is 71 yo BF with 3 week hx of nausea, emesis, and inability to tolerate a diet.  Seen by primary MD and USN abdomen obtained showing thickening of GB wall, o/w negative for acute findings.  Persistent complaints of N/V and diarrhea.  No fever, no chills, no hematemesis, no BRBPR.  Denies abdominal pain.  Last colonoscopy by Dr. Bosie Clos about 5 years ago.  CT scan today showing inflammatory versus neoplastic process in RUQ involving prox duodenum, colon, and gallbladder.  WBC minimally elevated, diff normal, LFT's normal.  Patient seen by [facility] and felt to be a surgical emergency.  See consult note.  Past Medical History  Diagnosis Date  . Hypercholesteremia   . Diabetes mellitus   . Hypertension     Past Surgical History  Procedure Date  . Abdominal hysterectomy     No family history on file. Social History:  reports that she has quit smoking. She does not have any smokeless tobacco history on file. She reports that she does not drink alcohol or use illicit drugs.  Allergies: No Known Allergies  Medications Prior to Admission  Medication Dose Route Frequency Provider Last Rate Last Dose  . 0.9 %  sodium chloride infusion   Intravenous Continuous Zannie Cove, MD      . ertapenem [facility]) 1 g in sodium chloride 0.9 % 50 mL IVPB  1 g Intravenous Once Laray Anger, DO   1 g at 02/13/12 1726  . imipenem-cilastatin (PRIMAXIN) 250 mg in sodium chloride 0.9 % 100 mL IVPB  250 mg Intravenous Q12H Berkley Harvey, PHARMD      . insulin aspart (novoLOG) injection 0-9 Units  0-9 Units Subcutaneous Q4H Zannie Cove, MD      . ondansetron [facility]) injection 4 mg  4 mg Intravenous Q1H PRN Laray Anger, DO      . potassium chloride 10 mEq in 100 mL IVPB  10 mEq Intravenous Q1 Hr x 5 Zannie Cove, MD   10 mEq at 02/13/12 1903   Medications Prior to Admission    Medication Sig Dispense Refill  . ezetimibe (ZETIA) 10 MG tablet Take 10 mg by mouth daily.      . metFORMIN (GLUCOPHAGE) 1000 MG tablet Take 1,000 mg by mouth 2 (two) times daily with a meal.      . rosuvastatin (CRESTOR) 20 MG tablet Take 20 mg by mouth daily.      Marland Kitchen telmisartan-hydrochlorothiazide (MICARDIS HCT) 80-12.5 MG per tablet Take 1 tablet by mouth daily.        Results for orders placed during the hospital encounter of 02/13/12 (from the past 48 hour(s))  CBC     Status: Abnormal   Collection Time   02/13/12  2:15 PM      Component Value Range Comment   WBC 10.6 (*) 4.0 - 10.5 (K/uL)    RBC 3.93  3.87 - 5.11 (MIL/uL)    Hemoglobin 10.1 (*) 12.0 - 15.0 (g/dL)    HCT 40.9 (*) 81.1 - 46.0 (%)    MCV 79.6  78.0 - 100.0 (fL)    MCH 25.7 (*) 26.0 - 34.0 (pg)    MCHC 32.3  30.0 - 36.0 (g/dL)    RDW 91.4 (*) 78.2 - 15.5 (%)    Platelets 652 (*) 150 - 400 (K/uL)   DIFFERENTIAL     Status: Abnormal  Collection Time   02/13/12  2:15 PM      Component Value Range Comment   Neutrophils Relative 71  43 - 77 (%)    Neutro Abs 7.6  1.7 - 7.7 (K/uL)    Lymphocytes Relative 12  12 - 46 (%)    Lymphs Abs 1.3  0.7 - 4.0 (K/uL)    Monocytes Relative 16 (*) 3 - 12 (%)    Monocytes Absolute 1.7 (*) 0.1 - 1.0 (K/uL)    Eosinophils Relative 0  0 - 5 (%)    Eosinophils Absolute 0.0  0.0 - 0.7 (K/uL)    Basophils Relative 0  0 - 1 (%)    Basophils Absolute 0.0  0.0 - 0.1 (K/uL)   COMPREHENSIVE METABOLIC PANEL     Status: Abnormal   Collection Time   02/13/12  2:15 PM      Component Value Range Comment   Sodium 138  135 - 145 (mEq/L)    Potassium 2.7 (*) 3.5 - 5.1 (mEq/L)    Chloride 79 (*) 96 - 112 (mEq/L)    CO2 38 (*) 19 - 32 (mEq/L)    Glucose, Bld 208 (*) 70 - 99 (mg/dL)    BUN 45 (*) 6 - 23 (mg/dL)    Creatinine, Ser 1.61 (*) 0.50 - 1.10 (mg/dL)    Calcium 9.5  8.4 - 10.5 (mg/dL)    Total Protein 8.1  6.0 - 8.3 (g/dL)    Albumin 3.0 (*) 3.5 - 5.2 (g/dL)    AST 20  0 - 37 (U/L)     ALT 24  0 - 35 (U/L)    Alkaline Phosphatase 140 (*) 39 - 117 (U/L)    Total Bilirubin 0.6  0.3 - 1.2 (mg/dL)    GFR calc non Af Amer 11 (*) >90 (mL/min)    GFR calc Af Amer 13 (*) >90 (mL/min)   LIPASE, BLOOD     Status: Normal   Collection Time   02/13/12  2:15 PM      Component Value Range Comment   Lipase 42  11 - 59 (U/L)   PROCALCITONIN     Status: Normal   Collection Time   02/13/12  2:15 PM      Component Value Range Comment   Procalcitonin 0.47     TROPONIN I     Status: Normal   Collection Time   02/13/12  2:15 PM      Component Value Range Comment   Troponin I <0.30  <0.30 (ng/mL)   LACTIC ACID, PLASMA     Status: Abnormal   Collection Time   02/13/12  2:33 PM      Component Value Range Comment   Lactic Acid, Venous 2.4 (*) 0.5 - 2.2 (mmol/L)   URINALYSIS, ROUTINE W REFLEX MICROSCOPIC     Status: Abnormal   Collection Time   02/13/12  3:06 PM      Component Value Range Comment   Color, Urine AMBER (*) YELLOW  BIOCHEMICALS MAY BE AFFECTED BY COLOR   APPearance TURBID (*) CLEAR     Specific Gravity, Urine 1.024  1.005 - 1.030     pH 5.5  5.0 - 8.0     Glucose, UA NEGATIVE  NEGATIVE (mg/dL)    Hgb urine dipstick NEGATIVE  NEGATIVE     Bilirubin Urine LARGE (*) NEGATIVE     Ketones, ur 15 (*) NEGATIVE (mg/dL)    Protein, ur 30 (*) NEGATIVE (mg/dL)    Urobilinogen, UA  1.0  0.0 - 1.0 (mg/dL)    Nitrite NEGATIVE  NEGATIVE     Leukocytes, UA TRACE (*) NEGATIVE    URINE MICROSCOPIC-ADD ON     Status: Abnormal   Collection Time   02/13/12  3:06 PM      Component Value Range Comment   Squamous Epithelial / LPF RARE  RARE     WBC, UA 0-2  <3 (WBC/hpf)    RBC / HPF 0-2  <3 (RBC/hpf)    Bacteria, UA MANY (*) RARE     Casts HYALINE CASTS (*) NEGATIVE    GLUCOSE, CAPILLARY     Status: Abnormal   Collection Time   02/13/12  6:57 PM      Component Value Range Comment   Glucose-Capillary 167 (*) 70 - 99 (mg/dL)    Comment 1 Notify RN      Comment 2 Documented in Chart       Ct Abdomen Pelvis Wo Contrast  02/13/2012  *RADIOLOGY REPORT*  Clinical Data: Vomiting for 3 weeks. Renal insufficiency.  Mild leukocytosis.  CT ABDOMEN AND PELVIS WITHOUT CONTRAST  Technique:  Multidetector CT imaging of the abdomen and pelvis was performed following the standard protocol without intravenous contrast.  Comparison: Abdominal ultrasound 01/27/2012.  Findings: There is mild atelectasis at both lung bases.  No significant pleural effusion is identified.  The gallbladder is diffusely abnormal, markedly distended and with ill-defined margins.  It measures approximately 8.7 x 5.9 cm transverse.  On the ultrasound performed less than 3 weeks ago, there was only mild gallbladder wall thickening.  There is multifocal adjacent soft tissue nodularity extending inferiorly to the hepatic flexure of the colon and medially to the gastric pylorus.  There appears to be some resulting gastric outlet obstruction, and no contrast enters the small or large bowel.  No focal hepatic lesions are identified.  The spleen, pancreas, adrenal glands and kidneys appear normal aside from a tiny nonobstructing calculus in the lower pole of the left kidney. There is no hydronephrosis.  The remainder the colon appears normal aside from sigmoid colon diverticular changes. The appendix is not seen - question prior appendectomy.  There is no generalized ascites or peritoneal nodularity.  The uterus is surgically absent.  There is no adnexal mass.  Aorto iliac atherosclerosis and lumbar spondylosis are noted.  IMPRESSION:  1.  Grossly abnormal process in the right upper quadrant involving the gallbladder, hepatic flexure of the colon and gastric pylorus. The nodularity and mass effect are concerning for a possible neoplastic process.  However, in light of the relatively normal recent ultrasound findings, this presumably represents acute cholecystitis with possible perforation and surrounding abscess formation. Surgical consultation  recommended. 2.  Suspected associated gastric outlet obstruction. 3.  No evidence of hydronephrosis. 4.  Mild bibasilar atelectasis.  These results were called by telephone on 02/13/2012  at  1630 hours to  Dr. Clarene Duke, who verbally acknowledged these results.  Original Report Authenticated By: Gerrianne Scale, M.D.   Dg Chest 2 View  02/13/2012  *RADIOLOGY REPORT*  Clinical Data: Cough, weakness, nausea  CHEST - 2 VIEW  Comparison: Portable chest x-ray of 03/15/2004  Findings: Linear atelectasis or scarring is noted at the lung bases right greater than left.  No definite pneumonia or effusion is seen.  The heart is mildly enlarged.  The bones are osteopenic with degenerative change.  IMPRESSION:   Bibasilar linear atelectasis or scarring right greater than left. Stable cardiomegaly.  No definite active process.  Original Report Authenticated By: Juline Patch, M.D.    Review of Systems  Constitutional: Negative for fever, chills, weight loss and diaphoresis.  HENT: Negative.   Eyes: Negative.   Respiratory: Negative.   Cardiovascular: Negative.   Gastrointestinal: Positive for nausea, vomiting and diarrhea. Negative for abdominal pain, constipation, blood in stool and melena.  Genitourinary: Negative.   Musculoskeletal: Negative.   Skin: Negative.   Neurological: Negative.   Endo/Heme/Allergies: Negative.   Psychiatric/Behavioral: Negative.     Blood pressure 112/50, pulse 92, temperature 98 F (36.7 C), temperature source Oral, resp. rate 20, weight 156 lb 3.2 oz (70.852 kg), SpO2 96.00%. Physical Exam  Constitutional: She is oriented to person, place, and time. She appears well-developed and well-nourished.  HENT:  Head: Normocephalic and atraumatic.  Right Ear: External ear normal.  Left Ear: External ear normal.  Nose: Nose normal.  Mouth/Throat: Oropharynx is clear and moist.  Eyes: Conjunctivae and EOM are normal. Pupils are equal, round, and reactive to light.  Neck: Normal  range of motion. Neck supple. No tracheal deviation present. No thyromegaly present.  Cardiovascular: Normal rate, regular rhythm, normal heart sounds and intact distal pulses.  Exam reveals no gallop and no friction rub.   No murmur heard. Respiratory: Effort normal and breath sounds normal. No respiratory distress. She has no wheezes. She has no rales. She exhibits no tenderness.  GI: Soft. Bowel sounds are normal. She exhibits no distension and no mass. There is no tenderness. There is no rebound and no guarding.  Musculoskeletal: Normal range of motion.  Lymphadenopathy:    She has no cervical adenopathy.  Neurological: She is alert and oriented to person, place, and time.  Skin: Skin is warm and dry.  Psychiatric: She has a normal mood and affect. Her behavior is normal. Judgment and thought content normal.     Assessment/Plan 1.  Nausea, emesis, and inability to tolerate diet consistent with gastric outlet obstruction of undetermined etiology.  May require EGD by gastroenterology. 2.  Possible acute cholecystitis, but few clinical signs or symptoms - see above.  Empiric abx per medical consultant. 3.  Consider possible neoplasm with gastric outlet obstruction, colonic involvement.  May require colonoscopy as well by GI. 4.  Diabetes mellitus 5.  Hypertension 6.  Dehydration  Velora Heckler, MD, [facility] Office: [phone]    Zayda Angell M 02/13/2012, 8:04 PM

## 2012-02-14 ENCOUNTER — Encounter (HOSPITAL_COMMUNITY): Payer: Self-pay | Admitting: *Deleted

## 2012-02-14 ENCOUNTER — Encounter (HOSPITAL_COMMUNITY): Admission: EM | Disposition: A | Discharge status: Home Health | Payer: Self-pay | Source: Home / Self Care

## 2012-02-14 DIAGNOSIS — N179 Acute kidney failure, unspecified: Secondary | ICD-10-CM | POA: Diagnosis present

## 2012-02-14 DIAGNOSIS — E876 Hypokalemia: Secondary | ICD-10-CM | POA: Diagnosis present

## 2012-02-14 HISTORY — PX: ESOPHAGOGASTRODUODENOSCOPY: SHX5428

## 2012-02-14 LAB — COMPREHENSIVE METABOLIC PANEL
ALT: 15 U/L (ref 0–35)
AST: 20 U/L (ref 0–37)
Albumin: 2.4 g/dL — ABNORMAL LOW (ref 3.5–5.2)
Alkaline Phosphatase: 107 U/L (ref 39–117)
BUN: 41 mg/dL — ABNORMAL HIGH (ref 6–23)
CO2: 34 mEq/L — ABNORMAL HIGH (ref 19–32)
Calcium: 8.3 mg/dL — ABNORMAL LOW (ref 8.4–10.5)
Chloride: 89 mEq/L — ABNORMAL LOW (ref 96–112)
Creatinine, Ser: 1.82 mg/dL — ABNORMAL HIGH (ref 0.50–1.10)
GFR calc Af Amer: 31 mL/min — ABNORMAL LOW (ref >90)
GFR calc non Af Amer: 27 mL/min — ABNORMAL LOW (ref >90)
Glucose, Bld: 133 mg/dL — ABNORMAL HIGH (ref 70–99)
Potassium: 2.8 mEq/L — ABNORMAL LOW (ref 3.5–5.1)
Sodium: 140 mEq/L (ref 135–145)
Total Bilirubin: 0.4 mg/dL (ref 0.3–1.2)
Total Protein: 6.4 g/dL (ref 6.0–8.3)

## 2012-02-14 LAB — CBC
HCT: 28.2 % — ABNORMAL LOW (ref 36.0–46.0)
Hemoglobin: 9.3 g/dL — ABNORMAL LOW (ref 12.0–15.0)
MCH: 26.2 pg (ref 26.0–34.0)
MCHC: 33 g/dL (ref 30.0–36.0)
MCV: 79.4 fL (ref 78.0–100.0)
Platelets: 526 10*3/uL — ABNORMAL HIGH (ref 150–400)
RBC: 3.55 MIL/uL — ABNORMAL LOW (ref 3.87–5.11)
RDW: 15.7 % — ABNORMAL HIGH (ref 11.5–15.5)
WBC: 9.7 10*3/uL (ref 4.0–10.5)

## 2012-02-14 LAB — GLUCOSE, CAPILLARY
Glucose-Capillary: 106 mg/dL — ABNORMAL HIGH (ref 70–99)
Glucose-Capillary: 125 mg/dL — ABNORMAL HIGH (ref 70–99)
Glucose-Capillary: 139 mg/dL — ABNORMAL HIGH (ref 70–99)
Glucose-Capillary: 89 mg/dL (ref 70–99)
Glucose-Capillary: 96 mg/dL (ref 70–99)

## 2012-02-14 LAB — CEA: CEA: <0.5 ng/mL (ref 0.0–5.0)

## 2012-02-14 SURGERY — EGD (ESOPHAGOGASTRODUODENOSCOPY)
Anesthesia: Moderate Sedation

## 2012-02-14 MED ORDER — FENTANYL NICU IV SYRINGE 50 MCG/ML
INJECTION | INTRAMUSCULAR | Status: DC | PRN
  Administered 2012-02-14 (×2): 25 ug via INTRAVENOUS

## 2012-02-14 MED ORDER — PHENOL 1.4 % MT LIQD
1.0000 | OROMUCOSAL | Status: DC | PRN
  Filled 2012-02-14: qty 177

## 2012-02-14 MED ORDER — PROMETHAZINE HCL 25 MG/ML IJ SOLN
12.5000 mg | Freq: Once | INTRAMUSCULAR | Status: AC
  Administered 2012-02-14: 12.5 mg via INTRAVENOUS
  Filled 2012-02-14: qty 1

## 2012-02-14 MED ORDER — MIDAZOLAM HCL 10 MG/2ML IJ SOLN
INTRAMUSCULAR | Status: DC | PRN
  Administered 2012-02-14 (×4): 1 mg via INTRAVENOUS

## 2012-02-14 MED ORDER — SODIUM CHLORIDE 0.9 % IV SOLN
250.0000 mg | Freq: Four times a day (QID) | INTRAVENOUS | Status: DC
  Administered 2012-02-14 – 2012-02-15 (×4): 250 mg via INTRAVENOUS
  Filled 2012-02-14 (×8): qty 250

## 2012-02-14 MED ORDER — POTASSIUM CHLORIDE 10 MEQ/100ML IV SOLN
10.0000 meq | INTRAVENOUS | Status: AC
  Administered 2012-02-14 (×5): 10 meq via INTRAVENOUS
  Filled 2012-02-14 (×5): qty 100

## 2012-02-14 MED ORDER — MORPHINE SULFATE 4 MG/ML IJ SOLN
4.0000 mg | Freq: Once | INTRAMUSCULAR | Status: AC
  Administered 2012-02-14: 4 mg via INTRAVENOUS
  Filled 2012-02-14: qty 1

## 2012-02-14 MED ORDER — BUTAMBEN-TETRACAINE-BENZOCAINE 2-2-14 % EX AERO
INHALATION_SPRAY | CUTANEOUS | Status: DC | PRN
  Administered 2012-02-14: 2 via TOPICAL

## 2012-02-14 MED ORDER — MENTHOL 3 MG MT LOZG
1.0000 | LOZENGE | OROMUCOSAL | Status: DC | PRN
  Filled 2012-02-14 (×2): qty 9

## 2012-02-14 NOTE — Progress Notes (Signed)
Late entry Pt transferred from ED. No family present alert x4 no c/o pain, bp 82/54. Rechecked 89/48, 99 hr. Paged Ralph Leyden orders given for 500cc bolus. Bp after bolus 110/42, 98. No new orders given will monitor.

## 2012-02-14 NOTE — Consult Note (Signed)
Subjective:   HPI  The patient is a 71 year old female who has been experiencing nausea and vomiting on a daily basis since March 25 of this year. Prior to this she was not complaining of nausea or vomiting or abdominal pain. In regards to any abdominal pain now when asked about this she simply states it feels like a gas discomfort and not really a pain. She denies heartburn. A recent abdominal ultrasound was negative and did not show any gallbladder abnormalities. Because of ongoing symptoms she came to the hospital where she was subsequently admitted. A CT scan was done showing an abnormality in the right upper quadrant in the region of the hepatic flexure, pylorus, and gallbladder region. The question was raised as to whether this might be acute cholecystitis. She has been seen in consultation by Dr. Gerrit Friends from the [facility] surgical group. She is not complaining of right upper quadrant abdominal pain. Her principal complaint is that of vomiting. Her liver enzymes are normal with the exception of a minimal elevation of alkaline phosphatase. It was not felt that her symptoms were suggestive of cholecystitis. There was concern however of a possible lesion causing gastric outlet obstruction and GI was consulted to investigate this. She had a colonoscopy 5 years ago which she states was normal.  Review of Systems She denies chest pain or shortness of breath  Past Medical History  Diagnosis Date  . Hypercholesteremia   . Diabetes mellitus   . Hypertension    Past Surgical History  Procedure Date  . Abdominal hysterectomy    History   Social History  . Marital Status: Single    Spouse Name: N/A    Number of Children: N/A  . Years of Education: N/A   Occupational History  . Not on file.   Social History Main Topics  . Smoking status: Former Games developer  . Smokeless tobacco: Not on file  . Alcohol Use: No  . Drug Use: No  . Sexually Active:    Other Topics Concern  . Not on file    Social History Narrative  . No narrative on file   family history is not on file. Current facility-administered medications:0.9 %  sodium chloride infusion, , Intravenous, Continuous, Zannie Cove, MD, Last Rate: 125 mL/hr at 02/14/12 0300;  ertapenem [facility]) 1 g in sodium chloride 0.9 % 50 mL IVPB, 1 g, Intravenous, Once, Laray Anger, DO, 1 g at 02/13/12 1726;  imipenem-cilastatin (PRIMAXIN) 250 mg in sodium chloride 0.9 % 100 mL IVPB, 250 mg, Intravenous, Q12H, Berkley Harvey, PHARMD, 250 mg at 02/14/12 0235 insulin aspart (novoLOG) injection 0-9 Units, 0-9 Units, Subcutaneous, Q4H, Zannie Cove, MD, 1 Units at 02/14/12 0910;  menthol-cetylpyridinium (CEPACOL) lozenge 3 mg, 1 lozenge, Oral, PRN, Roma Kayser Schorr, NP;  morphine 4 MG/ML injection 4 mg, 4 mg, Intravenous, Once, Leanne Chang, NP, 4 mg at 02/14/12 0238;  ondansetron [facility]) injection 4 mg, 4 mg, Intravenous, Q1H PRN, Laray Anger, DO, 4 mg at 02/13/12 2318 phenol (CHLORASEPTIC) mouth spray 1 spray, 1 spray, Mouth/Throat, PRN, Roma Kayser Schorr, NP;  potassium chloride 10 mEq in 100 mL IVPB, 10 mEq, Intravenous, Q1 Hr x 5, Zannie Cove, MD, 10 mEq at 02/14/12 0237;  potassium chloride 10 mEq in 100 mL IVPB, 10 mEq, Intravenous, Q1 Hr x 5, Zannie Cove, MD, 10 mEq at 02/14/12 0910 promethazine (PHENERGAN) injection 12.5 mg, 12.5 mg, Intravenous, Once, Leanne Chang, NP, 12.5 mg at 02/14/12 0238;  sodium chloride 0.9 %  bolus 500 mL, 500 mL, Intravenous, Once, Leanne Chang, NP, 500 mL at 02/14/12 0000;  white petrolatum (VASELINE) gel, , , ,  No Known Allergies   Objective:     BP 109/49  Pulse 92  Temp(Src) 98.5 F (36.9 C) (Oral)  Resp 16  Ht 5\' 4"  (1.626 m)  Wt 70.852 kg (156 lb 3.2 oz)  BMI 26.81 kg/m2  SpO2 98%  Alert and oriented  No acute distress  Heart regular rhythm no murmurs  Lungs clear  Abdomen: Bowel sounds normal it is soft and nontender  Laboratory No  components found with this basename: d1      Assessment:     #1 vomiting  #2 abnormal CT scan of abdomen  #3 rule out gastric outlet obstruction      Plan:     Proceed with EGD to evaluate upper GI tract Lab Results  Component Value Date   HGB 9.3* 02/14/2012   HGB 10.1* 02/13/2012   HCT 28.2* 02/14/2012   HCT 31.3* 02/13/2012   ALKPHOS 107 02/14/2012   ALKPHOS 140* 02/13/2012   AST 20 02/14/2012   AST 20 02/13/2012   ALT 15 02/14/2012   ALT 24 02/13/2012

## 2012-02-14 NOTE — Progress Notes (Signed)
Subjective: Feels better today, no vomiting, BP dropped last PM responded to fluid bolus  Objective: Vital signs in last 24 hours: Temp:  [97.5 F (36.4 C)-99.1 F (37.3 C)] 98.4 F (36.9 C) (04/12 1051) Pulse Rate:  [85-108] 96  (04/12 1051) Resp:  [16-49] 30  (04/12 1220) BP: (89-144)/(39-74) 109/50 mmHg (04/12 1220) SpO2:  [92 %-100 %] 95 % (04/12 1220) Weight:  [70.761 kg (156 lb)-70.852 kg (156 lb 3.2 oz)] 70.761 kg (156 lb) (04/12 1051) Weight change:  Last BM Date: 02/13/12  Intake/Output from previous day: 04/11 0701 - 04/12 0700 In: -  Out: 1100 [Urine:400; Emesis/NG output:700]     Physical Exam: General: Alert, awake, oriented x3, in no acute distress. HEENT: No bruits, no goiter. Heart: Regular rate and rhythm, without murmurs, rubs, gallops. Lungs: Clear to auscultation bilaterally. Abdomen: Soft, distended, mild R sided tenderness, diminished bowel sounds. Extremities: No clubbing cyanosis or edema with positive pedal pulses. Neuro: Grossly intact, nonfocal.    Lab Results: Basic Metabolic Panel:  Basename 02/14/12 0432 02/13/12 1415  NA 140 138  K 2.8* 2.7*  CL 89* 79*  CO2 34* 38*  GLUCOSE 133* 208*  BUN 41* 45*  CREATININE 1.82* 3.74*  CALCIUM 8.3* 9.5  MG -- --  PHOS -- --   Liver Function Tests:  [facility] 02/14/12 0432 02/13/12 1415  AST 20 20  ALT 15 24  ALKPHOS 107 140*  BILITOT 0.4 0.6  PROT 6.4 8.1  ALBUMIN 2.4* 3.0*    Basename 02/13/12 1415  LIPASE 42  AMYLASE --   No results found for this basename: AMMONIA:2 in the last 72 hours CBC:  Basename 02/14/12 0432 02/13/12 1415  WBC 9.7 10.6*  NEUTROABS -- 7.6  HGB 9.3* 10.1*  HCT 28.2* 31.3*  MCV 79.4 79.6  PLT 526* 652*   Cardiac Enzymes:  Basename 02/13/12 1415  CKTOTAL --  CKMB --  CKMBINDEX --  TROPONINI <0.30   BNP: No results found for this basename: PROBNP:3 in the last 72 hours D-Dimer: No results found for this basename: DDIMER:2 in the last 72  hours CBG:  Basename 02/14/12 0802 02/14/12 0356 02/13/12 2351 02/13/12 2227 02/13/12 1857  GLUCAP 125* 139* 106* 100* 167*   Hemoglobin A1C: No results found for this basename: HGBA1C in the last 72 hours Fasting Lipid Panel: No results found for this basename: CHOL,HDL,LDLCALC,TRIG,CHOLHDL,LDLDIRECT in the last 72 hours Thyroid Function Tests: No results found for this basename: TSH,T4TOTAL,FREET4,T3FREE,THYROIDAB in the last 72 hours Anemia Panel: No results found for this basename: VITAMINB12,FOLATE,FERRITIN,TIBC,IRON,RETICCTPCT in the last 72 hours Coagulation: No results found for this basename: LABPROT:2,INR:2 in the last 72 hours Urine Drug Screen: Drugs of Abuse  No results found for this basename: labopia, cocainscrnur, labbenz, amphetmu, thcu, labbarb    Alcohol Level: No results found for this basename: ETH:2 in the last 72 hours Urinalysis:  Basename 02/13/12 1506  COLORURINE AMBER*  LABSPEC 1.024  PHURINE 5.5  GLUCOSEU NEGATIVE  HGBUR NEGATIVE  BILIRUBINUR LARGE*  KETONESUR 15*  PROTEINUR 30*  UROBILINOGEN 1.0  NITRITE NEGATIVE  LEUKOCYTESUR TRACE*    No results found for this or any previous visit (from the past 240 hour(s)).  Studies/Results: Ct Abdomen Pelvis Wo Contrast  02/13/2012  *RADIOLOGY REPORT*  Clinical Data: Vomiting for 3 weeks. Renal insufficiency.  Mild leukocytosis.  CT ABDOMEN AND PELVIS WITHOUT CONTRAST  Technique:  Multidetector CT imaging of the abdomen and pelvis was performed following the standard protocol without intravenous contrast.  Comparison: Abdominal  ultrasound 01/27/2012.  Findings: There is mild atelectasis at both lung bases.  No significant pleural effusion is identified.  The gallbladder is diffusely abnormal, markedly distended and with ill-defined margins.  It measures approximately 8.7 x 5.9 cm transverse.  On the ultrasound performed less than 3 weeks ago, there was only mild gallbladder wall thickening.  There is  multifocal adjacent soft tissue nodularity extending inferiorly to the hepatic flexure of the colon and medially to the gastric pylorus.  There appears to be some resulting gastric outlet obstruction, and no contrast enters the small or large bowel.  No focal hepatic lesions are identified.  The spleen, pancreas, adrenal glands and kidneys appear normal aside from a tiny nonobstructing calculus in the lower pole of the left kidney. There is no hydronephrosis.  The remainder the colon appears normal aside from sigmoid colon diverticular changes. The appendix is not seen - question prior appendectomy.  There is no generalized ascites or peritoneal nodularity.  The uterus is surgically absent.  There is no adnexal mass.  Aorto iliac atherosclerosis and lumbar spondylosis are noted.  IMPRESSION:  1.  Grossly abnormal process in the right upper quadrant involving the gallbladder, hepatic flexure of the colon and gastric pylorus. The nodularity and mass effect are concerning for a possible neoplastic process.  However, in light of the relatively normal recent ultrasound findings, this presumably represents acute cholecystitis with possible perforation and surrounding abscess formation. Surgical consultation recommended. 2.  Suspected associated gastric outlet obstruction. 3.  No evidence of hydronephrosis. 4.  Mild bibasilar atelectasis.  These results were called by telephone on 02/13/2012  at  1630 hours to  Dr. Clarene Duke, who verbally acknowledged these results.  Original Report Authenticated By: Gerrianne Scale, M.D.   Dg Chest 2 View  02/13/2012  *RADIOLOGY REPORT*  Clinical Data: Cough, weakness, nausea  CHEST - 2 VIEW  Comparison: Portable chest x-ray of 03/15/2004  Findings: Linear atelectasis or scarring is noted at the lung bases right greater than left.  No definite pneumonia or effusion is seen.  The heart is mildly enlarged.  The bones are osteopenic with degenerative change.  IMPRESSION:   Bibasilar  linear atelectasis or scarring right greater than left. Stable cardiomegaly.  No definite active process.  Original Report Authenticated By: Juline Patch, M.D.    Medications: Scheduled Meds:   . ertapenem  1 g Intravenous Once  . imipenem-cilastatin  250 mg Intravenous Q6H  . insulin aspart  0-9 Units Subcutaneous Q4H  .  morphine injection  4 mg Intravenous Once  . potassium chloride  10 mEq Intravenous Q1 Hr x 5  . potassium chloride  10 mEq Intravenous Q1 Hr x 5  . promethazine  12.5 mg Intravenous Once  . sodium chloride  500 mL Intravenous Once  . white petrolatum      . DISCONTD: imipenem-cilastatin  250 mg Intravenous Q12H   Continuous Infusions:   . sodium chloride 125 mL/hr at 02/14/12 0300   PRN Meds:.menthol-cetylpyridinium, ondansetron, phenol, DISCONTD: butamben-tetracaine-benzocaine, DISCONTD: fentaNYL, DISCONTD: midazolam  Assessment/Plan: 1. Suspect walled of Perforated cholecystitis w/ abscess:  Improved on Imipenem, IVF EGD normal, extrinsic compression only, no luminal lesions Greatly appreciate Dr.Ganem's consult. Further plans per [facility] 2. Acute renal failure : improving, secondary to ATN, dehydration and being on ARB, metformin and diuretic.  Continue IVF,CT without evidence of obstruction  Strict 1/Os  3. DM: stable, Stopped metformin, Novolog SSI Q4h since she is NPO  4. HTN: stable, no need for  antihypertensives now.  5. Hypokalemia: replace  6. Thrombocytosis: reactive 7. Periop-cardiac risk: low to moderate, negative myoview in 2005. Asymptomatic and no acute EKG changes.   LOS: 1 day   [facility] Pager: [phone] 02/14/2012, 12:38 PM

## 2012-02-14 NOTE — Progress Notes (Signed)
ANTIBIOTIC CONSULT NOTE - Follow Up  Pharmacy Consult for Primaxin Indication: perforated cholecystitis  No Known Allergies  Patient Measurements: Height: 5\' 4"  (162.6 cm) Weight: 156 lb 3.2 oz (70.852 kg) IBW/kg (Calculated) : 54.7   Vital Signs: Temp: 98.5 F (36.9 C) (04/12 1000) Temp src: Oral (04/12 1000) BP: 109/49 mmHg (04/12 1000) Pulse Rate: 92  (04/12 1000) Intake/Output from previous day: 04/11 0701 - 04/12 0700 In: -  Out: 1100 [Urine:400; Emesis/NG output:700] Intake/Output from this shift:    Labs:  Basename 02/14/12 0432 02/13/12 1415  WBC 9.7 10.6*  HGB 9.3* 10.1*  PLT 526* 652*  LABCREA -- --  CREATININE 1.82* 3.74*   Estimated Creatinine Clearance: 27.8 ml/min (by C-G formula based on Cr of 1.82).  32 ml/min (normalized)   Microbiology: No results found for this or any previous visit (from the past 720 hour(s)).  Medical History: Past Medical History  Diagnosis Date  . Hypercholesteremia   . Diabetes mellitus   . Hypertension     Medications:  Scheduled:     . ertapenem  1 g Intravenous Once  . imipenem-cilastatin  250 mg Intravenous Q12H  . insulin aspart  0-9 Units Subcutaneous Q4H  .  morphine injection  4 mg Intravenous Once  . potassium chloride  10 mEq Intravenous Q1 Hr x 5  . potassium chloride  10 mEq Intravenous Q1 Hr x 5  . promethazine  12.5 mg Intravenous Once  . sodium chloride  500 mL Intravenous Once  . white petrolatum       Infusions:     . sodium chloride 125 mL/hr at 02/14/12 0300   Assessment:  71 yo female with perforated cholecystitis  Day #2 Primaxin 250mg  q12h.  Note that patient just got Invanz 1g at 1730.   Scr has significantly improved, normalized CrCl = 32 ml/min  Goal of Therapy:  primaxin adjustment per renal function  Plan:  1. Increase Primaxin 250mg  q6h per protocol 2. Monitor renal function for potential dose changes 3. Follow up culture data   Loralee Pacas, PharmD, BCPS Pager:  [phone] 02/14/2012,10:33 AM

## 2012-02-14 NOTE — Progress Notes (Signed)
Received order for Aspiration/Drainage of RUQ process. Pt had recent Upper Endo earlier today with sedation. Case reviewed by Dr. Fredia Sorrow, will keep NPO and plan for CT aspiration/poss drain tomorrow am. Will consent in am. No anticoagulation this pm or in am.  Barbee Shropshire, [facility] 02/14/2012 3:59 PM

## 2012-02-14 NOTE — Op Note (Signed)
[facility] [address] [place], [place]  [address]  ENDOSCOPY PROCEDURE REPORT  PATIENT:  Kelly, Farmer  MR#:  [id number] BIRTHDATE:  February 23, 1941, 70 yrs. old  GENDER:  female  ENDOSCOPIST:  Wandalee Ferdinand, MD Referred by:  PROCEDURE DATE:  02/14/2012 PROCEDURE: EGD ASA CLASS: 2 INDICATIONS: Persistent vomiting rule out gastric outlet obstruction  MEDICATIONS: Fentanyl 50 mcg IV, Versed 4 mg IV  TOPICAL ANESTHETIC: Cetacaine spray  DESCRIPTION OF PROCEDURE:   After the risks benefits and alternatives of the procedure were thoroughly explained, informed consent was obtained.  The Pentax Gastroscope [id number] endoscope was introduced through the mouth and advanced to the second portion of the duodenum. <<PROCEDUREIMAGES>>  FINDINGS: Esophagus: Distal esophagitis most likely from ongoing vomiting  Stomach and duodenum: There is extrinsic compression of the antrum and pyloric region deforming this area. I was able to advance the scope through this compressed area however into the duodenum. No luminal lesions were seen to explain this problem. There were no obvious ulcers or tumors visually seen in the lumen of the stomach or duodenum. This appears to be an extrinsic compression causing her gastric outlet obstruction.  COMPLICATIONS: None  ENDOSCOPIC IMPRESSION: See above  RECOMMENDATIONS: Continue medical management. As per the surgical teams further recommendations  ______________________________ Wandalee Ferdinand, MD  CC:  n. eSIGNED:   Sam Shanele Nissan at 02/14/2012 12:05 PM  Lurline Del, [phone]

## 2012-02-14 NOTE — Progress Notes (Signed)
Nausea & vomiting  Subjective: Patient says she feels better this am than in several days. Only c/o is sore throat from vomiting. NO abdominal pain.  Objective: Vital signs in last 24 hours: Temp:  [97.5 F (36.4 C)-99.1 F (37.3 C)] 98.5 F (36.9 C) (04/12 1000) Pulse Rate:  [85-108] 92  (04/12 1000) Resp:  [16-20] 16  (04/12 1000) BP: (89-127)/(41-52) 109/49 mmHg (04/12 1000) SpO2:  [93 %-98 %] 98 % (04/12 1000) Weight:  [156 lb 3.2 oz (70.852 kg)] 156 lb 3.2 oz (70.852 kg) (04/12 0600) Last BM Date: 02/13/12  Intake/Output from previous day: 04/11 0701 - 04/12 0700 In: -  Out: 1100 [Urine:400; Emesis/NG output:700] Intake/Output this shift:    General appearance: alert, cooperative and no distress GI: Abdomen is soft and not tender except slight RUQ to deep palpation  Lab Results:  Results for orders placed during the hospital encounter of 02/13/12 (from the past 24 hour(s))  CBC     Status: Abnormal   Collection Time   02/13/12  2:15 PM      Component Value Range   WBC 10.6 (*) 4.0 - 10.5 (K/uL)   RBC 3.93  3.87 - 5.11 (MIL/uL)   Hemoglobin 10.1 (*) 12.0 - 15.0 (g/dL)   HCT 40.9 (*) 81.1 - 46.0 (%)   MCV 79.6  78.0 - 100.0 (fL)   MCH 25.7 (*) 26.0 - 34.0 (pg)   MCHC 32.3  30.0 - 36.0 (g/dL)   RDW 91.4 (*) 78.2 - 15.5 (%)   Platelets 652 (*) 150 - 400 (K/uL)  DIFFERENTIAL     Status: Abnormal   Collection Time   02/13/12  2:15 PM      Component Value Range   Neutrophils Relative 71  43 - 77 (%)   Neutro Abs 7.6  1.7 - 7.7 (K/uL)   Lymphocytes Relative 12  12 - 46 (%)   Lymphs Abs 1.3  0.7 - 4.0 (K/uL)   Monocytes Relative 16 (*) 3 - 12 (%)   Monocytes Absolute 1.7 (*) 0.1 - 1.0 (K/uL)   Eosinophils Relative 0  0 - 5 (%)   Eosinophils Absolute 0.0  0.0 - 0.7 (K/uL)   Basophils Relative 0  0 - 1 (%)   Basophils Absolute 0.0  0.0 - 0.1 (K/uL)  COMPREHENSIVE METABOLIC PANEL     Status: Abnormal   Collection Time   02/13/12  2:15 PM      Component Value Range   Sodium 138  135 - 145 (mEq/L)   Potassium 2.7 (*) 3.5 - 5.1 (mEq/L)   Chloride 79 (*) 96 - 112 (mEq/L)   CO2 38 (*) 19 - 32 (mEq/L)   Glucose, Bld 208 (*) 70 - 99 (mg/dL)   BUN 45 (*) 6 - 23 (mg/dL)   Creatinine, Ser 9.56 (*) 0.50 - 1.10 (mg/dL)   Calcium 9.5  8.4 - 21.3 (mg/dL)   Total Protein 8.1  6.0 - 8.3 (g/dL)   Albumin 3.0 (*) 3.5 - 5.2 (g/dL)   AST 20  0 - 37 (U/L)   ALT 24  0 - 35 (U/L)   Alkaline Phosphatase 140 (*) 39 - 117 (U/L)   Total Bilirubin 0.6  0.3 - 1.2 (mg/dL)   GFR calc non Af Amer 11 (*) >90 (mL/min)   GFR calc Af Amer 13 (*) >90 (mL/min)  LIPASE, BLOOD     Status: Normal   Collection Time   02/13/12  2:15 PM      Component  Value Range   Lipase 42  11 - 59 (U/L)  PROCALCITONIN     Status: Normal   Collection Time   02/13/12  2:15 PM      Component Value Range   Procalcitonin 0.47    TROPONIN I     Status: Normal   Collection Time   02/13/12  2:15 PM      Component Value Range   Troponin I <0.30  <0.30 (ng/mL)  LACTIC ACID, PLASMA     Status: Abnormal   Collection Time   02/13/12  2:33 PM      Component Value Range   Lactic Acid, Venous 2.4 (*) 0.5 - 2.2 (mmol/L)  URINALYSIS, ROUTINE W REFLEX MICROSCOPIC     Status: Abnormal   Collection Time   02/13/12  3:06 PM      Component Value Range   Color, Urine AMBER (*) YELLOW    APPearance TURBID (*) CLEAR    Specific Gravity, Urine 1.024  1.005 - 1.030    pH 5.5  5.0 - 8.0    Glucose, UA NEGATIVE  NEGATIVE (mg/dL)   Hgb urine dipstick NEGATIVE  NEGATIVE    Bilirubin Urine LARGE (*) NEGATIVE    Ketones, ur 15 (*) NEGATIVE (mg/dL)   Protein, ur 30 (*) NEGATIVE (mg/dL)   Urobilinogen, UA 1.0  0.0 - 1.0 (mg/dL)   Nitrite NEGATIVE  NEGATIVE    Leukocytes, UA TRACE (*) NEGATIVE   URINE MICROSCOPIC-ADD ON     Status: Abnormal   Collection Time   02/13/12  3:06 PM      Component Value Range   Squamous Epithelial / LPF RARE  RARE    WBC, UA 0-2  <3 (WBC/hpf)   RBC / HPF 0-2  <3 (RBC/hpf)   Bacteria, UA  MANY (*) RARE    Casts HYALINE CASTS (*) NEGATIVE   GLUCOSE, CAPILLARY     Status: Abnormal   Collection Time   02/13/12  6:57 PM      Component Value Range   Glucose-Capillary 167 (*) 70 - 99 (mg/dL)   Comment 1 Notify RN     Comment 2 Documented in Chart    GLUCOSE, CAPILLARY     Status: Abnormal   Collection Time   02/13/12 10:27 PM      Component Value Range   Glucose-Capillary 100 (*) 70 - 99 (mg/dL)   Comment 1 Notify RN     Comment 2 Documented in Chart    GLUCOSE, CAPILLARY     Status: Abnormal   Collection Time   02/13/12 11:51 PM      Component Value Range   Glucose-Capillary 106 (*) 70 - 99 (mg/dL)  GLUCOSE, CAPILLARY     Status: Abnormal   Collection Time   02/14/12  3:56 AM      Component Value Range   Glucose-Capillary 139 (*) 70 - 99 (mg/dL)  CBC     Status: Abnormal   Collection Time   02/14/12  4:32 AM      Component Value Range   WBC 9.7  4.0 - 10.5 (K/uL)   RBC 3.55 (*) 3.87 - 5.11 (MIL/uL)   Hemoglobin 9.3 (*) 12.0 - 15.0 (g/dL)   HCT 40.9 (*) 81.1 - 46.0 (%)   MCV 79.4  78.0 - 100.0 (fL)   MCH 26.2  26.0 - 34.0 (pg)   MCHC 33.0  30.0 - 36.0 (g/dL)   RDW 91.4 (*) 78.2 - 15.5 (%)   Platelets 526 (*) 150 -  400 (K/uL)  COMPREHENSIVE METABOLIC PANEL     Status: Abnormal   Collection Time   02/14/12  4:32 AM      Component Value Range   Sodium 140  135 - 145 (mEq/L)   Potassium 2.8 (*) 3.5 - 5.1 (mEq/L)   Chloride 89 (*) 96 - 112 (mEq/L)   CO2 34 (*) 19 - 32 (mEq/L)   Glucose, Bld 133 (*) 70 - 99 (mg/dL)   BUN 41 (*) 6 - 23 (mg/dL)   Creatinine, Ser 3.08 (*) 0.50 - 1.10 (mg/dL)   Calcium 8.3 (*) 8.4 - 10.5 (mg/dL)   Total Protein 6.4  6.0 - 8.3 (g/dL)   Albumin 2.4 (*) 3.5 - 5.2 (g/dL)   AST 20  0 - 37 (U/L)   ALT 15  0 - 35 (U/L)   Alkaline Phosphatase 107  39 - 117 (U/L)   Total Bilirubin 0.4  0.3 - 1.2 (mg/dL)   GFR calc non Af Amer 27 (*) >90 (mL/min)   GFR calc Af Amer 31 (*) >90 (mL/min)  GLUCOSE, CAPILLARY     Status: Abnormal   Collection  Time   02/14/12  8:02 AM      Component Value Range   Glucose-Capillary 125 (*) 70 - 99 (mg/dL)     Studies/Results Radiology     MEDS, Scheduled    . ertapenem  1 g Intravenous Once  . imipenem-cilastatin  250 mg Intravenous Q12H  . insulin aspart  0-9 Units Subcutaneous Q4H  .  morphine injection  4 mg Intravenous Once  . potassium chloride  10 mEq Intravenous Q1 Hr x 5  . potassium chloride  10 mEq Intravenous Q1 Hr x 5  . promethazine  12.5 mg Intravenous Once  . sodium chloride  500 mL Intravenous Once  . white petrolatum         Assessment: Nausea & vomiting RUQ process of uncertain etiology. She does not clinically look like acute cholecystitis with abscess, although it could be well walled off. Reviewed CT with radiologist and this could be aspirated and if abscess, drained, and if not, cytology obtained. Mild hypokalemia, but with abn renal function will need caution in replacing.  Plan: Await upper endo. The CT suggests to me that the duodenum is obstructed from extrinsic source. May need colonoscopy as well as upper endo. Depending on endo will make next plans. She is on way to endo now.  LOS: 1 day    Currie Paris, MD, [facility], [place] [phone]   02/14/2012 10:27 AM

## 2012-02-14 NOTE — Progress Notes (Signed)
   CARE MANAGEMENT NOTE 02/14/2012  Patient:  Kelly Farmer, Kelly Farmer   Account Number:  [id number]  Date Initiated:  02/14/2012  Documentation initiated by:  Lanier Clam  Subjective/Objective Assessment:   ADMITTED W/N/V/D.     Action/Plan:   FROM HOME.   Anticipated DC Date:  02/18/2012   Anticipated DC Plan:  HOME/SELF CARE         Choice offered to / List presented to:             Status of service:  In process, will continue to follow Medicare Important Message given?   (If response is "NO", the following Medicare IM given date fields will be blank) Date Medicare IM given:   Date Additional Medicare IM given:    Discharge Disposition:    Per UR Regulation:  Reviewed for med. necessity/level of care/duration of stay  If discussed at Long Length of Stay Meetings, dates discussed:    Comments:  02/14/12 [facility] RN,BSN NCM [phone] [facility] FOLLOWING.

## 2012-02-15 ENCOUNTER — Encounter (HOSPITAL_COMMUNITY): Payer: Self-pay | Admitting: Radiology

## 2012-02-15 ENCOUNTER — Inpatient Hospital Stay (HOSPITAL_COMMUNITY): Payer: Medicare Other

## 2012-02-15 LAB — BASIC METABOLIC PANEL
BUN: 25 mg/dL — ABNORMAL HIGH (ref 6–23)
CO2: 27 mEq/L (ref 19–32)
Calcium: 8.4 mg/dL (ref 8.4–10.5)
Chloride: 97 mEq/L (ref 96–112)
Creatinine, Ser: 0.83 mg/dL (ref 0.50–1.10)
GFR calc Af Amer: 81 mL/min — ABNORMAL LOW (ref >90)
GFR calc non Af Amer: 70 mL/min — ABNORMAL LOW (ref >90)
Glucose, Bld: 113 mg/dL — ABNORMAL HIGH (ref 70–99)
Potassium: 3 mEq/L — ABNORMAL LOW (ref 3.5–5.1)
Sodium: 145 mEq/L (ref 135–145)

## 2012-02-15 LAB — GLUCOSE, CAPILLARY
Glucose-Capillary: 131 mg/dL — ABNORMAL HIGH (ref 70–99)
Glucose-Capillary: 154 mg/dL — ABNORMAL HIGH (ref 70–99)
Glucose-Capillary: 173 mg/dL — ABNORMAL HIGH (ref 70–99)
Glucose-Capillary: 226 mg/dL — ABNORMAL HIGH (ref 70–99)
Glucose-Capillary: 94 mg/dL (ref 70–99)
Glucose-Capillary: 99 mg/dL (ref 70–99)

## 2012-02-15 LAB — URINE CULTURE
Colony Count: 90000
Culture  Setup Time: 201304120120

## 2012-02-15 LAB — CBC
HCT: 28.5 % — ABNORMAL LOW (ref 36.0–46.0)
Hemoglobin: 9.1 g/dL — ABNORMAL LOW (ref 12.0–15.0)
MCH: 26 pg (ref 26.0–34.0)
MCHC: 31.9 g/dL (ref 30.0–36.0)
MCV: 81.4 fL (ref 78.0–100.0)
Platelets: 490 10*3/uL — ABNORMAL HIGH (ref 150–400)
RBC: 3.5 MIL/uL — ABNORMAL LOW (ref 3.87–5.11)
RDW: 16.2 % — ABNORMAL HIGH (ref 11.5–15.5)
WBC: 9.2 10*3/uL (ref 4.0–10.5)

## 2012-02-15 LAB — PROTIME-INR
INR: 1.33 (ref 0.00–1.49)
Prothrombin Time: 16.7 seconds — ABNORMAL HIGH (ref 11.6–15.2)

## 2012-02-15 LAB — APTT: aPTT: 22 seconds — ABNORMAL LOW (ref 24–37)

## 2012-02-15 LAB — CLOSTRIDIUM DIFFICILE BY PCR: Toxigenic C. Difficile by PCR: NEGATIVE

## 2012-02-15 MED ORDER — ONDANSETRON HCL 4 MG/2ML IJ SOLN
4.0000 mg | Freq: Four times a day (QID) | INTRAMUSCULAR | Status: DC | PRN
  Administered 2012-02-15: 4 mg via INTRAVENOUS

## 2012-02-15 MED ORDER — POTASSIUM CHLORIDE 10 MEQ/100ML IV SOLN
10.0000 meq | INTRAVENOUS | Status: AC
  Administered 2012-02-15 (×2): 10 meq via INTRAVENOUS
  Filled 2012-02-15 (×2): qty 100

## 2012-02-15 MED ORDER — POTASSIUM CHLORIDE IN NACL 40-0.9 MEQ/L-% IV SOLN
INTRAVENOUS | Status: DC
  Administered 2012-02-15: 100 mL/h via INTRAVENOUS
  Administered 2012-02-16 – 2012-02-17 (×3): via INTRAVENOUS
  Administered 2012-02-17: 10 mL/h via INTRAVENOUS
  Administered 2012-02-17: 04:00:00 via INTRAVENOUS
  Filled 2012-02-15 (×6): qty 1000

## 2012-02-15 MED ORDER — SODIUM CHLORIDE 0.9 % IV SOLN
INTRAVENOUS | Status: DC
  Filled 2012-02-15 (×3): qty 1000

## 2012-02-15 MED ORDER — ONDANSETRON HCL 4 MG/2ML IJ SOLN
INTRAMUSCULAR | Status: AC
  Filled 2012-02-15: qty 2

## 2012-02-15 MED ORDER — SODIUM CHLORIDE 0.9 % IV SOLN
500.0000 mg | Freq: Four times a day (QID) | INTRAVENOUS | Status: DC
  Administered 2012-02-15 – 2012-02-20 (×21): 500 mg via INTRAVENOUS
  Filled 2012-02-15 (×26): qty 500

## 2012-02-15 MED ORDER — MORPHINE SULFATE 2 MG/ML IJ SOLN
2.0000 mg | INTRAMUSCULAR | Status: DC | PRN
  Administered 2012-02-15 – 2012-02-16 (×2): 2 mg via INTRAVENOUS
  Filled 2012-02-15 (×2): qty 1

## 2012-02-15 MED ORDER — FENTANYL CITRATE 0.05 MG/ML IJ SOLN
INTRAMUSCULAR | Status: AC | PRN
  Administered 2012-02-15 (×2): 50 ug via INTRAVENOUS

## 2012-02-15 MED ORDER — MIDAZOLAM HCL 5 MG/5ML IJ SOLN
INTRAMUSCULAR | Status: AC | PRN
  Administered 2012-02-15 (×2): 1 mg via INTRAVENOUS

## 2012-02-15 NOTE — Procedures (Signed)
The right upper quadrant was evaluated with [place].  US findings were suggestive for cholecystitis.  As a result,  CT drainage was performed like a percutaneous cholecystostomy tube.  10 French drain placed.  180 ml of thick yellow purulent fluid removed.  No immediate complication.

## 2012-02-15 NOTE — Progress Notes (Signed)
Nausea & vomiting  Subjective: Feels OK. Tolerated drain placement well. Still no pain. Nausea has improved.  Objective: Vital signs in last 24 hours: Temp:  [98 F (36.7 C)-99.1 F (37.3 C)] 98.6 F (37 C) (04/13 1120) Pulse Rate:  [88-100] 92  (04/13 1120) Resp:  [9-49] 20  (04/13 1120) BP: (99-126)/(46-73) 107/53 mmHg (04/13 1120) SpO2:  [93 %-100 %] 93 % (04/13 1120) Last BM Date: 02/13/12  Intake/Output from previous day: 04/12 0701 - 04/13 0700 In: -  Out: 1250 [Urine:1050; Emesis/NG output:200] Intake/Output this shift: Total I/O In: -  Out: 450 [Urine:450]  GI: soft, non-tender; bowel sounds normal; no masses,  no organomegaly Drain has frank pus  Lab Results:  Results for orders placed during the hospital encounter of 02/13/12 (from the past 24 hour(s))  GLUCOSE, CAPILLARY     Status: Abnormal   Collection Time   02/14/12  5:32 PM      Component Value Range   Glucose-Capillary 106 (*) 70 - 99 (mg/dL)  GLUCOSE, CAPILLARY     Status: Normal   Collection Time   02/14/12  8:05 PM      Component Value Range   Glucose-Capillary 89  70 - 99 (mg/dL)  GLUCOSE, CAPILLARY     Status: Normal   Collection Time   02/14/12 11:58 PM      Component Value Range   Glucose-Capillary 96  70 - 99 (mg/dL)  GLUCOSE, CAPILLARY     Status: Normal   Collection Time   02/15/12  4:03 AM      Component Value Range   Glucose-Capillary 99  70 - 99 (mg/dL)  CBC     Status: Abnormal   Collection Time   02/15/12  4:55 AM      Component Value Range   WBC 9.2  4.0 - 10.5 (K/uL)   RBC 3.50 (*) 3.87 - 5.11 (MIL/uL)   Hemoglobin 9.1 (*) 12.0 - 15.0 (g/dL)   HCT 96.0 (*) 45.4 - 46.0 (%)   MCV 81.4  78.0 - 100.0 (fL)   MCH 26.0  26.0 - 34.0 (pg)   MCHC 31.9  30.0 - 36.0 (g/dL)   RDW 09.8 (*) 11.9 - 15.5 (%)   Platelets 490 (*) 150 - 400 (K/uL)  BASIC METABOLIC PANEL     Status: Abnormal   Collection Time   02/15/12  4:55 AM      Component Value Range   Sodium 145  135 - 145 (mEq/L)   Potassium 3.0 (*) 3.5 - 5.1 (mEq/L)   Chloride 97  96 - 112 (mEq/L)   CO2 27  19 - 32 (mEq/L)   Glucose, Bld 113 (*) 70 - 99 (mg/dL)   BUN 25 (*) 6 - 23 (mg/dL)   Creatinine, Ser 1.47  0.50 - 1.10 (mg/dL)   Calcium 8.4  8.4 - 82.9 (mg/dL)   GFR calc non Af Amer 70 (*) >90 (mL/min)   GFR calc Af Amer 81 (*) >90 (mL/min)  APTT     Status: Abnormal   Collection Time   02/15/12  4:55 AM      Component Value Range   aPTT 22 (*) 24 - 37 (seconds)  PROTIME-INR     Status: Abnormal   Collection Time   02/15/12  4:55 AM      Component Value Range   Prothrombin Time 16.7 (*) 11.6 - 15.2 (seconds)   INR 1.33  0.00 - 1.49      Studies/Results Radiology  MEDS, Scheduled    . imipenem-cilastatin  250 mg Intravenous Q6H  . insulin aspart  0-9 Units Subcutaneous Q4H  . ondansetron      . potassium chloride  10 mEq Intravenous Q1 Hr x 5  . potassium chloride  10 mEq Intravenous Q1 Hr x 2     Assessment: Nausea & vomiting RUQ abscess, most likely gangrenous cholecystitis based on sono appearance today.  Plan: She will need IV antibiotics and await cultures . Will try to advance diet. Discussed findings with her and told her she will need follow up scans and probable cholecystectomy at some point. Still confusing issue since she has had little pain fever or elevated wbc  LOS: 2 days    Currie Paris, MD, [facility], [place] [phone]   02/15/2012 11:59 AM

## 2012-02-15 NOTE — H&P (Signed)
Kelly Farmer is an 71 y.o. female.   Chief Complaint: RUQ process HPI: Pt admitted for abd pain, N/V and findings of RUQ process concerning for acute infectious process with abscess formation vs. Neoplastic process. She has had upper endoscopy as well as this process appears to be causing gastric outlet obstruction. Request for perc aspiration and/or poss drain placement by IR.  Past Medical History  Diagnosis Date  . Hypercholesteremia   . Diabetes mellitus   . Hypertension   . Arthritis     Past Surgical History  Procedure Date  . Abdominal hysterectomy   . Appendectomy     History reviewed. No pertinent family history. Social History:  reports that she has quit smoking. She does not have any smokeless tobacco history on file. She reports that she does not drink alcohol or use illicit drugs.  Allergies: No Known Allergies  Medications Prior to Admission  Medication Dose Route Frequency Provider Last Rate Last Dose  . 0.9 %  sodium chloride infusion   Intravenous Continuous Zannie Cove, MD 100 mL/hr at 02/15/12 0630 1,000 mL at 02/15/12 0630  . ertapenem (INVANZ) 1 g in sodium chloride 0.9 % 50 mL IVPB  1 g Intravenous Once Laray Anger, DO   1 g at 02/13/12 1726  . imipenem-cilastatin (PRIMAXIN) 250 mg in sodium chloride 0.9 % 100 mL IVPB  250 mg Intravenous Q6H Rollene Fare, PHARMD   250 mg at 02/15/12 0305  . insulin aspart (novoLOG) injection 0-9 Units  0-9 Units Subcutaneous Q4H Zannie Cove, MD   1 Units at 02/15/12 0810  . menthol-cetylpyridinium (CEPACOL) lozenge 3 mg  1 lozenge Oral PRN Roma Kayser Schorr, NP      . morphine 4 MG/ML injection 4 mg  4 mg Intravenous Once Leanne Chang, NP   4 mg at 02/14/12 0238  . ondansetron (ZOFRAN) 4 MG/2ML injection           . ondansetron (ZOFRAN) injection 4 mg  4 mg Intravenous Q1H PRN Laray Anger, DO   4 mg at 02/14/12 2311  . ondansetron (ZOFRAN) injection 4 mg  4 mg Intravenous Q6H PRN Everett Graff, NP   4 mg at 02/15/12 0428  . phenol (CHLORASEPTIC) mouth spray 1 spray  1 spray Mouth/Throat PRN Roma Kayser Schorr, NP      . potassium chloride 10 mEq in 100 mL IVPB  10 mEq Intravenous Q1 Hr x 5 Zannie Cove, MD   10 mEq at 02/14/12 0237  . potassium chloride 10 mEq in 100 mL IVPB  10 mEq Intravenous Q1 Hr x 5 Zannie Cove, MD   10 mEq at 02/14/12 1811  . promethazine (PHENERGAN) injection 12.5 mg  12.5 mg Intravenous Once Leanne Chang, NP   12.5 mg at 02/14/12 0238  . sodium chloride 0.9 % bolus 500 mL  500 mL Intravenous Once Leanne Chang, NP   500 mL at 02/14/12 0000  . white petrolatum (VASELINE) gel           . DISCONTD: butamben-tetracaine-benzocaine (CETACAINE) spray    PRN Graylin Shiver, MD   2 spray at 02/14/12 1136  . DISCONTD: fentaNYL NICU IV Syringe 50 mcg/mL    PRN Graylin Shiver, MD   25 mcg at 02/14/12 1142  . DISCONTD: imipenem-cilastatin (PRIMAXIN) 250 mg in sodium chloride 0.9 % 100 mL IVPB  250 mg Intravenous Q12H Berkley Harvey, PHARMD   250 mg at 02/14/12 0235  . DISCONTD:  midazolam (VERSED) injection    PRN Graylin Shiver, MD   1 mg at 02/14/12 1144   No current outpatient prescriptions on file as of 02/15/2012.    Results for orders placed during the hospital encounter of 02/13/12 (from the past 48 hour(s))  CBC     Status: Abnormal   Collection Time   02/13/12  2:15 PM      Component Value Range Comment   WBC 10.6 (*) 4.0 - 10.5 (K/uL)    RBC 3.93  3.87 - 5.11 (MIL/uL)    Hemoglobin 10.1 (*) 12.0 - 15.0 (g/dL)    HCT 16.1 (*) 09.6 - 46.0 (%)    MCV 79.6  78.0 - 100.0 (fL)    MCH 25.7 (*) 26.0 - 34.0 (pg)    MCHC 32.3  30.0 - 36.0 (g/dL)    RDW 04.5 (*) 40.9 - 15.5 (%)    Platelets 652 (*) 150 - 400 (K/uL)   DIFFERENTIAL     Status: Abnormal   Collection Time   02/13/12  2:15 PM      Component Value Range Comment   Neutrophils Relative 71  43 - 77 (%)    Neutro Abs 7.6  1.7 - 7.7 (K/uL)    Lymphocytes Relative 12  12 - 46 (%)     Lymphs Abs 1.3  0.7 - 4.0 (K/uL)    Monocytes Relative 16 (*) 3 - 12 (%)    Monocytes Absolute 1.7 (*) 0.1 - 1.0 (K/uL)    Eosinophils Relative 0  0 - 5 (%)    Eosinophils Absolute 0.0  0.0 - 0.7 (K/uL)    Basophils Relative 0  0 - 1 (%)    Basophils Absolute 0.0  0.0 - 0.1 (K/uL)   COMPREHENSIVE METABOLIC PANEL     Status: Abnormal   Collection Time   02/13/12  2:15 PM      Component Value Range Comment   Sodium 138  135 - 145 (mEq/L)    Potassium 2.7 (*) 3.5 - 5.1 (mEq/L)    Chloride 79 (*) 96 - 112 (mEq/L)    CO2 38 (*) 19 - 32 (mEq/L)    Glucose, Bld 208 (*) 70 - 99 (mg/dL)    BUN 45 (*) 6 - 23 (mg/dL)    Creatinine, Ser 8.11 (*) 0.50 - 1.10 (mg/dL)    Calcium 9.5  8.4 - 10.5 (mg/dL)    Total Protein 8.1  6.0 - 8.3 (g/dL)    Albumin 3.0 (*) 3.5 - 5.2 (g/dL)    AST 20  0 - 37 (U/L)    ALT 24  0 - 35 (U/L)    Alkaline Phosphatase 140 (*) 39 - 117 (U/L)    Total Bilirubin 0.6  0.3 - 1.2 (mg/dL)    GFR calc non Af Amer 11 (*) >90 (mL/min)    GFR calc Af Amer 13 (*) >90 (mL/min)   LIPASE, BLOOD     Status: Normal   Collection Time   02/13/12  2:15 PM      Component Value Range Comment   Lipase 42  11 - 59 (U/L)   PROCALCITONIN     Status: Normal   Collection Time   02/13/12  2:15 PM      Component Value Range Comment   Procalcitonin 0.47     TROPONIN I     Status: Normal   Collection Time   02/13/12  2:15 PM      Component Value Range Comment  Troponin I <0.30  <0.30 (ng/mL)   LACTIC ACID, PLASMA     Status: Abnormal   Collection Time   02/13/12  2:33 PM      Component Value Range Comment   Lactic Acid, Venous 2.4 (*) 0.5 - 2.2 (mmol/L)   URINALYSIS, ROUTINE W REFLEX MICROSCOPIC     Status: Abnormal   Collection Time   02/13/12  3:06 PM      Component Value Range Comment   Color, Urine AMBER (*) YELLOW  BIOCHEMICALS MAY BE AFFECTED BY COLOR   APPearance TURBID (*) CLEAR     Specific Gravity, Urine 1.024  1.005 - 1.030     pH 5.5  5.0 - 8.0     Glucose, UA NEGATIVE   NEGATIVE (mg/dL)    Hgb urine dipstick NEGATIVE  NEGATIVE     Bilirubin Urine LARGE (*) NEGATIVE     Ketones, ur 15 (*) NEGATIVE (mg/dL)    Protein, ur 30 (*) NEGATIVE (mg/dL)    Urobilinogen, UA 1.0  0.0 - 1.0 (mg/dL)    Nitrite NEGATIVE  NEGATIVE     Leukocytes, UA TRACE (*) NEGATIVE    URINE CULTURE     Status: Normal   Collection Time   02/13/12  3:06 PM      Component Value Range Comment   Specimen Description URINE, CLEAN CATCH      Special Requests NONE      Culture  Setup Time [id number]      Colony Count 90,000 COLONIES/ML      Culture YEAST      Report Status 02/15/2012 FINAL     URINE MICROSCOPIC-ADD ON     Status: Abnormal   Collection Time   02/13/12  3:06 PM      Component Value Range Comment   Squamous Epithelial / LPF RARE  RARE     WBC, UA 0-2  <3 (WBC/hpf)    RBC / HPF 0-2  <3 (RBC/hpf)    Bacteria, UA MANY (*) RARE     Casts HYALINE CASTS (*) NEGATIVE    GLUCOSE, CAPILLARY     Status: Abnormal   Collection Time   02/13/12  6:57 PM      Component Value Range Comment   Glucose-Capillary 167 (*) 70 - 99 (mg/dL)    Comment 1 Notify RN      Comment 2 Documented in Chart     GLUCOSE, CAPILLARY     Status: Abnormal   Collection Time   02/13/12 10:27 PM      Component Value Range Comment   Glucose-Capillary 100 (*) 70 - 99 (mg/dL)    Comment 1 Notify RN      Comment 2 Documented in Chart     GLUCOSE, CAPILLARY     Status: Abnormal   Collection Time   02/13/12 11:51 PM      Component Value Range Comment   Glucose-Capillary 106 (*) 70 - 99 (mg/dL)   GLUCOSE, CAPILLARY     Status: Abnormal   Collection Time   02/14/12  3:56 AM      Component Value Range Comment   Glucose-Capillary 139 (*) 70 - 99 (mg/dL)   CBC     Status: Abnormal   Collection Time   02/14/12  4:32 AM      Component Value Range Comment   WBC 9.7  4.0 - 10.5 (K/uL)    RBC 3.55 (*) 3.87 - 5.11 (MIL/uL)    Hemoglobin 9.3 (*) 12.0 - 15.0 (g/dL)  HCT 28.2 (*) 36.0 - 46.0 (%)    MCV 79.4   78.0 - 100.0 (fL)    MCH 26.2  26.0 - 34.0 (pg)    MCHC 33.0  30.0 - 36.0 (g/dL)    RDW 16.1 (*) 09.6 - 15.5 (%)    Platelets 526 (*) 150 - 400 (K/uL)   COMPREHENSIVE METABOLIC PANEL     Status: Abnormal   Collection Time   02/14/12  4:32 AM      Component Value Range Comment   Sodium 140  135 - 145 (mEq/L) REPEATED TO VERIFY   Potassium 2.8 (*) 3.5 - 5.1 (mEq/L) REPEATED TO VERIFY   Chloride 89 (*) 96 - 112 (mEq/L)    CO2 34 (*) 19 - 32 (mEq/L) REPEATED TO VERIFY   Glucose, Bld 133 (*) 70 - 99 (mg/dL) REPEATED TO VERIFY   BUN 41 (*) 6 - 23 (mg/dL) REPEATED TO VERIFY   Creatinine, Ser 1.82 (*) 0.50 - 1.10 (mg/dL)    Calcium 8.3 (*) 8.4 - 10.5 (mg/dL) REPEATED TO VERIFY   Total Protein 6.4  6.0 - 8.3 (g/dL)    Albumin 2.4 (*) 3.5 - 5.2 (g/dL)    AST 20  0 - 37 (U/L)    ALT 15  0 - 35 (U/L)    Alkaline Phosphatase 107  39 - 117 (U/L)    Total Bilirubin 0.4  0.3 - 1.2 (mg/dL)    GFR calc non Af Amer 27 (*) >90 (mL/min)    GFR calc Af Amer 31 (*) >90 (mL/min)   CEA     Status: Normal   Collection Time   02/14/12  4:32 AM      Component Value Range Comment   CEA <0.5  0.0 - 5.0 (ng/mL)   GLUCOSE, CAPILLARY     Status: Abnormal   Collection Time   02/14/12  8:02 AM      Component Value Range Comment   Glucose-Capillary 125 (*) 70 - 99 (mg/dL)   GLUCOSE, CAPILLARY     Status: Abnormal   Collection Time   02/14/12  5:32 PM      Component Value Range Comment   Glucose-Capillary 106 (*) 70 - 99 (mg/dL)   GLUCOSE, CAPILLARY     Status: Normal   Collection Time   02/14/12  8:05 PM      Component Value Range Comment   Glucose-Capillary 89  70 - 99 (mg/dL)   GLUCOSE, CAPILLARY     Status: Normal   Collection Time   02/14/12 11:58 PM      Component Value Range Comment   Glucose-Capillary 96  70 - 99 (mg/dL)   GLUCOSE, CAPILLARY     Status: Normal   Collection Time   02/15/12  4:03 AM      Component Value Range Comment   Glucose-Capillary 99  70 - 99 (mg/dL)   CBC     Status: Abnormal    Collection Time   02/15/12  4:55 AM      Component Value Range Comment   WBC 9.2  4.0 - 10.5 (K/uL)    RBC 3.50 (*) 3.87 - 5.11 (MIL/uL)    Hemoglobin 9.1 (*) 12.0 - 15.0 (g/dL)    HCT 04.5 (*) 40.9 - 46.0 (%)    MCV 81.4  78.0 - 100.0 (fL)    MCH 26.0  26.0 - 34.0 (pg)    MCHC 31.9  30.0 - 36.0 (g/dL)    RDW 81.1 (*) 91.4 - 15.5 (%)  Platelets 490 (*) 150 - 400 (K/uL)   BASIC METABOLIC PANEL     Status: Abnormal   Collection Time   02/15/12  4:55 AM      Component Value Range Comment   Sodium 145  135 - 145 (mEq/L)    Potassium 3.0 (*) 3.5 - 5.1 (mEq/L)    Chloride 97  96 - 112 (mEq/L)    CO2 27  19 - 32 (mEq/L)    Glucose, Bld 113 (*) 70 - 99 (mg/dL)    BUN 25 (*) 6 - 23 (mg/dL)    Creatinine, Ser 1.61  0.50 - 1.10 (mg/dL)    Calcium 8.4  8.4 - 10.5 (mg/dL)    GFR calc non Af Amer 70 (*) >90 (mL/min)    GFR calc Af Amer 81 (*) >90 (mL/min)   APTT     Status: Abnormal   Collection Time   02/15/12  4:55 AM      Component Value Range Comment   aPTT 22 (*) 24 - 37 (seconds)   PROTIME-INR     Status: Abnormal   Collection Time   02/15/12  4:55 AM      Component Value Range Comment   Prothrombin Time 16.7 (*) 11.6 - 15.2 (seconds)    INR 1.33  0.00 - 1.49     Ct Abdomen Pelvis Wo Contrast  02/13/2012  *RADIOLOGY REPORT*  Clinical Data: Vomiting for 3 weeks. Renal insufficiency.  Mild leukocytosis.  CT ABDOMEN AND PELVIS WITHOUT CONTRAST  Technique:  Multidetector CT imaging of the abdomen and pelvis was performed following the standard protocol without intravenous contrast.  Comparison: Abdominal ultrasound 01/27/2012.  Findings: There is mild atelectasis at both lung bases.  No significant pleural effusion is identified.  The gallbladder is diffusely abnormal, markedly distended and with ill-defined margins.  It measures approximately 8.7 x 5.9 cm transverse.  On the ultrasound performed less than 3 weeks ago, there was only mild gallbladder wall thickening.  There is multifocal  adjacent soft tissue nodularity extending inferiorly to the hepatic flexure of the colon and medially to the gastric pylorus.  There appears to be some resulting gastric outlet obstruction, and no contrast enters the small or large bowel.  No focal hepatic lesions are identified.  The spleen, pancreas, adrenal glands and kidneys appear normal aside from a tiny nonobstructing calculus in the lower pole of the left kidney. There is no hydronephrosis.  The remainder the colon appears normal aside from sigmoid colon diverticular changes. The appendix is not seen - question prior appendectomy.  There is no generalized ascites or peritoneal nodularity.  The uterus is surgically absent.  There is no adnexal mass.  Aorto iliac atherosclerosis and lumbar spondylosis are noted.  IMPRESSION:  1.  Grossly abnormal process in the right upper quadrant involving the gallbladder, hepatic flexure of the colon and gastric pylorus. The nodularity and mass effect are concerning for a possible neoplastic process.  However, in light of the relatively normal recent ultrasound findings, this presumably represents acute cholecystitis with possible perforation and surrounding abscess formation. Surgical consultation recommended. 2.  Suspected associated gastric outlet obstruction. 3.  No evidence of hydronephrosis. 4.  Mild bibasilar atelectasis.  These results were called by telephone on 02/13/2012  at  1630 hours to  Dr. Clarene Duke, who verbally acknowledged these results.  Original Report Authenticated By: Gerrianne Scale, M.D.   Dg Chest 2 View  02/13/2012  *RADIOLOGY REPORT*  Clinical Data: Cough, weakness, nausea  CHEST -  2 VIEW  Comparison: Portable chest x-ray of 03/15/2004  Findings: Linear atelectasis or scarring is noted at the lung bases right greater than left.  No definite pneumonia or effusion is seen.  The heart is mildly enlarged.  The bones are osteopenic with degenerative change.  IMPRESSION:   Bibasilar linear  atelectasis or scarring right greater than left. Stable cardiomegaly.  No definite active process.  Original Report Authenticated By: Juline Patch, M.D.    Review of Systems  Constitutional: Negative for fever and chills.  Respiratory: Negative for cough and shortness of breath.   Cardiovascular: Negative for chest pain, palpitations and leg swelling.  Gastrointestinal: Positive for nausea, vomiting and abdominal pain. Negative for blood in stool.    Blood pressure 116/62, pulse 96, temperature 98 F (36.7 C), temperature source Oral, resp. rate 18, height 5\' 4"  (1.626 m), weight 156 lb (70.761 kg), SpO2 96.00%. Physical Exam  Constitutional: She is oriented to person, place, and time. She appears well-developed and well-nourished. No distress.  HENT:  Head: Normocephalic.  Neck: Normal range of motion. Neck supple. No tracheal deviation present.  Cardiovascular: Normal rate, regular rhythm and normal heart sounds.   No murmur heard. Respiratory: Effort normal and breath sounds normal. No respiratory distress. She has no wheezes.  GI: Soft. Bowel sounds are normal. There is tenderness. There is no rebound.       Mild RUQ tenderness  Neurological: She is alert and oriented to person, place, and time.  Psychiatric: She has a normal mood and affect. Judgment normal.    ENDOSCOPY FINDINGS:  Esophagus: Distal esophagitis most likely from ongoing  vomiting  Stomach and duodenum: There is extrinsic compression of the antrum  and pyloric region deforming this area. I was able to advance the  scope through this compressed area however into the duodenum. No  luminal lesions were seen to explain this problem. There were no  obvious ulcers or tumors visually seen in the lumen of the stomach  or duodenum. This appears to be an extrinsic compression causing  her gastric outlet obstruction.   Assessment/Plan RUQ abscess/mass Coags normal. Has been NPO Will proceed with CT guided asp/drain  this morning. Procedure including risks, complications, and sedation discussed at length. Consent signed in chart.  Brayton El PA-C 02/15/2012, 9:04 AM

## 2012-02-15 NOTE — Progress Notes (Signed)
ANTIBIOTIC CONSULT NOTE - Follow Up  Pharmacy Consult for Primaxin Indication: perforated cholecystitis  No Known Allergies  Patient Measurements: Height: 5\' 4"  (162.6 cm) Weight: 156 lb (70.761 kg) IBW/kg (Calculated) : 54.7   Vital Signs: Temp: 97.9 F (36.6 C) (04/13 1201) Temp src: Oral (04/13 1201) BP: 107/49 mmHg (04/13 1201) Pulse Rate: 90  (04/13 1201) Intake/Output from previous day: 04/12 0701 - 04/13 0700 In: -  Out: 1250 [Urine:1050; Emesis/NG output:200] Intake/Output from this shift: Total I/O In: -  Out: 750 [Urine:750]  Labs:  [facility] 02/15/12 0455 02/14/12 0432 02/13/12 1415  WBC 9.2 9.7 10.6*  HGB 9.1* 9.3* 10.1*  PLT 490* 526* 652*  LABCREA -- -- --  CREATININE 0.83 1.82* 3.74*   Estimated Creatinine Clearance: 60.8 ml/min (by C-G formula based on Cr of 0.83).  32 ml/min (normalized)   Microbiology: Recent Results (from the past 720 hour(s))  URINE CULTURE     Status: Normal   Collection Time   02/13/12  3:06 PM      Component Value Range Status Comment   Specimen Description URINE, CLEAN CATCH   Final    Special Requests NONE   Final    Culture  Setup Time [id number]   Final    Colony Count 90,000 COLONIES/ML   Final    Culture YEAST   Final    Report Status 02/15/2012 FINAL   Final     Medical History: Past Medical History  Diagnosis Date  . Hypercholesteremia   . Diabetes mellitus   . Hypertension   . Arthritis     Medications:  Scheduled:     . imipenem-cilastatin  250 mg Intravenous Q6H  . insulin aspart  0-9 Units Subcutaneous Q4H  . ondansetron      . potassium chloride  10 mEq Intravenous Q1 Hr x 5  . potassium chloride  10 mEq Intravenous Q1 Hr x 2   Infusions:     . 0.9 % NaCl with KCl 40 mEq / L 100 mL/hr (02/15/12 1233)  . DISCONTD: sodium chloride 1,000 mL (02/15/12 0630)  . DISCONTD: sodium chloride 0.9 % 1,000 mL with potassium chloride 40 mEq infusion     Assessment:  71 yo female with perforated  cholecystitis  Day #3 Primaxin 250mg  q6h.  Note that patient just got Invanz 1g at 1730.   Scr has significantly improved, normalized CrCl = 71 ml/min now  Goal of Therapy:  primaxin adjustment per renal function  Plan:  1. Increase Primaxin 500mg  q6h per protocol 2. Monitor renal function for potential dose changes 3. Follow up culture data   [name], PharmD, BCPS Pager [phone] 02/15/2012 12:34 PM

## 2012-02-15 NOTE — Progress Notes (Signed)
Subjective: Back from IR, now has drain Without much pain/N/V  Objective: Vital signs in last 24 hours: Temp:  [98 F (36.7 C)-99.1 F (37.3 C)] 98.6 F (37 C) (04/13 1053) Pulse Rate:  [88-100] 91  (04/13 1053) Resp:  [9-49] 22  (04/13 1053) BP: (89-144)/(39-74) 99/52 mmHg (04/13 1053) SpO2:  [93 %-100 %] 95 % (04/13 1053) Weight change: -0.091 kg (-3.2 oz) Last BM Date: 02/13/12  Intake/Output from previous day: 04/12 0701 - 04/13 0700 In: -  Out: 1250 [Urine:1050; Emesis/NG output:200] Total I/O In: -  Out: 450 [Urine:450]   Physical Exam: General: Alert, awake, oriented x3, in no acute distress. HEENT: No bruits, no goiter. Heart: Regular rate and rhythm, without murmurs, rubs, gallops. Lungs: Clear to auscultation bilaterally. Abdomen: Soft, distended, mild R sided tenderness, diminished bowel sounds, drain with thick yellow pus Extremities: No clubbing cyanosis or edema with positive pedal pulses. Neuro: Grossly intact, nonfocal.    Lab Results: Basic Metabolic Panel:  Basename 02/15/12 0455 02/14/12 0432  NA 145 140  K 3.0* 2.8*  CL 97 89*  CO2 27 34*  GLUCOSE 113* 133*  BUN 25* 41*  CREATININE 0.83 1.82*  CALCIUM 8.4 8.3*  MG -- --  PHOS -- --   Liver Function Tests:  [facility] 02/14/12 0432 02/13/12 1415  AST 20 20  ALT 15 24  ALKPHOS 107 140*  BILITOT 0.4 0.6  PROT 6.4 8.1  ALBUMIN 2.4* 3.0*    Basename 02/13/12 1415  LIPASE 42  AMYLASE --   No results found for this basename: AMMONIA:2 in the last 72 hours CBC:  Basename 02/15/12 0455 02/14/12 0432 02/13/12 1415  WBC 9.2 9.7 --  NEUTROABS -- -- 7.6  HGB 9.1* 9.3* --  HCT 28.5* 28.2* --  MCV 81.4 79.4 --  PLT 490* 526* --   Cardiac Enzymes:  Basename 02/13/12 1415  CKTOTAL --  CKMB --  CKMBINDEX --  TROPONINI <0.30   BNP: No results found for this basename: PROBNP:3 in the last 72 hours D-Dimer: No results found for this basename: DDIMER:2 in the last 72  hours CBG:  Basename 02/15/12 0403 02/14/12 2358 02/14/12 2005 02/14/12 1732 02/14/12 0802 02/14/12 0356  GLUCAP 99 96 89 106* 125* 139*   Hemoglobin A1C: No results found for this basename: HGBA1C in the last 72 hours Fasting Lipid Panel: No results found for this basename: CHOL,HDL,LDLCALC,TRIG,CHOLHDL,LDLDIRECT in the last 72 hours Thyroid Function Tests: No results found for this basename: TSH,T4TOTAL,FREET4,T3FREE,THYROIDAB in the last 72 hours Anemia Panel: No results found for this basename: VITAMINB12,FOLATE,FERRITIN,TIBC,IRON,RETICCTPCT in the last 72 hours Coagulation:  Basename 02/15/12 0455  LABPROT 16.7*  INR 1.33   Urine Drug Screen: Drugs of Abuse  No results found for this basename: labopia,  cocainscrnur,  labbenz,  amphetmu,  thcu,  labbarb    Alcohol Level: No results found for this basename: ETH:2 in the last 72 hours Urinalysis:  Basename 02/13/12 1506  COLORURINE AMBER*  LABSPEC 1.024  PHURINE 5.5  GLUCOSEU NEGATIVE  HGBUR NEGATIVE  BILIRUBINUR LARGE*  KETONESUR 15*  PROTEINUR 30*  UROBILINOGEN 1.0  NITRITE NEGATIVE  LEUKOCYTESUR TRACE*    Recent Results (from the past 240 hour(s))  URINE CULTURE     Status: Normal   Collection Time   02/13/12  3:06 PM      Component Value Range Status Comment   Specimen Description URINE, CLEAN CATCH   Final    Special Requests NONE   Final    Culture  Setup Time [id number]   Final    Colony Count 90,000 COLONIES/ML   Final    Culture YEAST   Final    Report Status 02/15/2012 FINAL   Final     Studies/Results: Ct Abdomen Pelvis Wo Contrast  02/13/2012  *RADIOLOGY REPORT*  Clinical Data: Vomiting for 3 weeks. Renal insufficiency.  Mild leukocytosis.  CT ABDOMEN AND PELVIS WITHOUT CONTRAST  Technique:  Multidetector CT imaging of the abdomen and pelvis was performed following the standard protocol without intravenous contrast.  Comparison: Abdominal ultrasound 01/27/2012.  Findings: There is mild  atelectasis at both lung bases.  No significant pleural effusion is identified.  The gallbladder is diffusely abnormal, markedly distended and with ill-defined margins.  It measures approximately 8.7 x 5.9 cm transverse.  On the ultrasound performed less than 3 weeks ago, there was only mild gallbladder wall thickening.  There is multifocal adjacent soft tissue nodularity extending inferiorly to the hepatic flexure of the colon and medially to the gastric pylorus.  There appears to be some resulting gastric outlet obstruction, and no contrast enters the small or large bowel.  No focal hepatic lesions are identified.  The spleen, pancreas, adrenal glands and kidneys appear normal aside from a tiny nonobstructing calculus in the lower pole of the left kidney. There is no hydronephrosis.  The remainder the colon appears normal aside from sigmoid colon diverticular changes. The appendix is not seen - question prior appendectomy.  There is no generalized ascites or peritoneal nodularity.  The uterus is surgically absent.  There is no adnexal mass.  Aorto iliac atherosclerosis and lumbar spondylosis are noted.  IMPRESSION:  1.  Grossly abnormal process in the right upper quadrant involving the gallbladder, hepatic flexure of the colon and gastric pylorus. The nodularity and mass effect are concerning for a possible neoplastic process.  However, in light of the relatively normal recent ultrasound findings, this presumably represents acute cholecystitis with possible perforation and surrounding abscess formation. Surgical consultation recommended. 2.  Suspected associated gastric outlet obstruction. 3.  No evidence of hydronephrosis. 4.  Mild bibasilar atelectasis.  These results were called by telephone on 02/13/2012  at  1630 hours to  Dr. Clarene Duke, who verbally acknowledged these results.  Original Report Authenticated By: Gerrianne Scale, M.D.   Dg Chest 2 View  02/13/2012  *RADIOLOGY REPORT*  Clinical Data: Cough,  weakness, nausea  CHEST - 2 VIEW  Comparison: Portable chest x-ray of 03/15/2004  Findings: Linear atelectasis or scarring is noted at the lung bases right greater than left.  No definite pneumonia or effusion is seen.  The heart is mildly enlarged.  The bones are osteopenic with degenerative change.  IMPRESSION:   Bibasilar linear atelectasis or scarring right greater than left. Stable cardiomegaly.  No definite active process.  Original Report Authenticated By: Juline Patch, M.D.    Medications: Scheduled Meds:    . imipenem-cilastatin  250 mg Intravenous Q6H  . insulin aspart  0-9 Units Subcutaneous Q4H  . ondansetron      . potassium chloride  10 mEq Intravenous Q1 Hr x 5  . potassium chloride  10 mEq Intravenous Q1 Hr x 2   Continuous Infusions:    . sodium chloride 0.9 % 1,000 mL with potassium chloride 40 mEq infusion    . DISCONTD: sodium chloride 1,000 mL (02/15/12 0630)   PRN Meds:.fentaNYL, menthol-cetylpyridinium, midazolam, ondansetron, ondansetron, phenol, DISCONTD: butamben-tetracaine-benzocaine, DISCONTD: fentaNYL, DISCONTD: midazolam  Assessment/Plan: 1. Walled Perforated cholecystitis w/ abscess:  on Imipenem  Day 3, continue IVF S/p Ct guided drainage- thick pus, 79F drain placed,  confirming diagnosis of cholecystitis w/ abscess  EGD normal, extrinsic compression only, no luminal lesions Greatly appreciate Dr.Ganem's consult. per [facility] 2. Acute renal failure : resolved secondary to ATN, dehydration and being on ARB, metformin and diuretic.  Continue IVF today, add KCl, CT without evidence of obstruction  Strict 1/Os  3. DM: stable, Stopped metformin, Novolog SSI Q4h since she is NPO  4. HTN: stable, no need for antihypertensives now.  5. Hypokalemia: replace  6. Thrombocytosis: reactive 7. Periop-cardiac risk: low to moderate, negative myoview in 2005. Asymptomatic and no acute EKG changes.   LOS: 2 days   [facility] Pager:  [phone] 02/15/2012, 11:15 AM

## 2012-02-16 DIAGNOSIS — K651 Peritoneal abscess: Secondary | ICD-10-CM

## 2012-02-16 DIAGNOSIS — E119 Type 2 diabetes mellitus without complications: Secondary | ICD-10-CM

## 2012-02-16 LAB — GLUCOSE, CAPILLARY
Glucose-Capillary: 111 mg/dL — ABNORMAL HIGH (ref 70–99)
Glucose-Capillary: 129 mg/dL — ABNORMAL HIGH (ref 70–99)
Glucose-Capillary: 114 mg/dL — ABNORMAL HIGH (ref 70–99)
Glucose-Capillary: 139 mg/dL — ABNORMAL HIGH (ref 70–99)
Glucose-Capillary: 160 mg/dL — ABNORMAL HIGH (ref 70–99)

## 2012-02-16 LAB — BASIC METABOLIC PANEL
BUN: 12 mg/dL (ref 6–23)
CO2: 28 mEq/L (ref 19–32)
Calcium: 7.8 mg/dL — ABNORMAL LOW (ref 8.4–10.5)
Chloride: 108 mEq/L (ref 96–112)
Creatinine, Ser: 0.62 mg/dL (ref 0.50–1.10)
GFR calc Af Amer: >90 mL/min (ref >90)
GFR calc non Af Amer: 89 mL/min — ABNORMAL LOW (ref >90)
Glucose, Bld: 127 mg/dL — ABNORMAL HIGH (ref 70–99)
Potassium: 3.6 mEq/L (ref 3.5–5.1)
Sodium: 142 mEq/L (ref 135–145)

## 2012-02-16 MED ORDER — INSULIN ASPART 100 UNIT/ML ~~LOC~~ SOLN
0.0000 [IU] | Freq: Three times a day (TID) | SUBCUTANEOUS | Status: DC
  Administered 2012-02-16 (×2): 1 [IU] via SUBCUTANEOUS
  Administered 2012-02-17: 2 [IU] via SUBCUTANEOUS
  Administered 2012-02-17 – 2012-02-18 (×3): 1 [IU] via SUBCUTANEOUS
  Administered 2012-02-18: 2 [IU] via SUBCUTANEOUS
  Administered 2012-02-19 – 2012-02-20 (×3): 1 [IU] via SUBCUTANEOUS
  Administered 2012-02-20: 2 [IU] via SUBCUTANEOUS
  Administered 2012-02-21: 1 [IU] via SUBCUTANEOUS

## 2012-02-16 MED ORDER — HYDROCODONE-ACETAMINOPHEN 5-325 MG PO TABS: 1.0000 | ORAL_TABLET | ORAL | Status: DC | PRN

## 2012-02-16 NOTE — Progress Notes (Addendum)
Subjective: Doing well, without pain/N/V Had a BM yesterday  Objective: Vital signs in last 24 hours: Temp:  [97.7 F (36.5 C)-98.7 F (37.1 C)] 97.9 F (36.6 C) (04/14 0400) Pulse Rate:  [86-99] 86  (04/14 0400) Resp:  [18-22] 22  (04/14 0400) BP: (98-109)/(47-62) 99/61 mmHg (04/14 0400) SpO2:  [94 %-97 %] 97 % (04/14 0400) Weight change:  Last BM Date: 02/15/12  Intake/Output from previous day: 04/13 0701 - 04/14 0700 In: 3078.3 [P.O.:480; I.V.:2298.3; IV Piggyback:300] Out: 1030 [Urine:900; Drains:130] Total I/O In: 360 [P.O.:360] Out: 500 [Urine:500]   Physical Exam: General: Alert, awake, oriented x3, in no acute distress. HEENT: No bruits, no goiter. Heart: Regular rate and rhythm, without murmurs, rubs, gallops. Lungs: Clear to auscultation bilaterally. Abdomen: Soft, distended, mild R sided tenderness, diminished bowel sounds, drain with thick yellow pus Extremities: No clubbing cyanosis or edema with positive pedal pulses. Neuro: Grossly intact, nonfocal.    Lab Results: Basic Metabolic Panel:  Basename 02/16/12 0518 02/15/12 0455  NA 142 145  K 3.6 3.0*  CL 108 97  CO2 28 27  GLUCOSE 127* 113*  BUN 12 25*  CREATININE 0.62 0.83  CALCIUM 7.8* 8.4  MG -- --  PHOS -- --   Liver Function Tests:  [facility] 02/14/12 0432 02/13/12 1415  AST 20 20  ALT 15 24  ALKPHOS 107 140*  BILITOT 0.4 0.6  PROT 6.4 8.1  ALBUMIN 2.4* 3.0*    Basename 02/13/12 1415  LIPASE 42  AMYLASE --   No results found for this basename: AMMONIA:2 in the last 72 hours CBC:  Basename 02/16/12 0518 02/15/12 0455 02/13/12 1415  WBC 4.4 9.2 --  NEUTROABS -- -- 7.6  HGB 13.8 9.1* --  HCT 43.6 28.5* --  MCV 79.7 81.4 --  PLT 208 490* --   Cardiac Enzymes:  Basename 02/13/12 1415  CKTOTAL --  CKMB --  CKMBINDEX --  TROPONINI <0.30   BNP: No results found for this basename: PROBNP:3 in the last 72 hours D-Dimer: No results found for this basename: DDIMER:2 in the  last 72 hours CBG:  Basename 02/16/12 1144 02/16/12 0745 02/16/12 0347 02/15/12 2337 02/15/12 2016 02/15/12 1604  GLUCAP 114* 129* 111* 154* 173* 226*   Hemoglobin A1C: No results found for this basename: HGBA1C in the last 72 hours Fasting Lipid Panel: No results found for this basename: CHOL,HDL,LDLCALC,TRIG,CHOLHDL,LDLDIRECT in the last 72 hours Thyroid Function Tests: No results found for this basename: TSH,T4TOTAL,FREET4,T3FREE,THYROIDAB in the last 72 hours Anemia Panel: No results found for this basename: VITAMINB12,FOLATE,FERRITIN,TIBC,IRON,RETICCTPCT in the last 72 hours Coagulation:  Basename 02/15/12 0455  LABPROT 16.7*  INR 1.33   Urine Drug Screen: Drugs of Abuse  No results found for this basename: labopia,  cocainscrnur,  labbenz,  amphetmu,  thcu,  labbarb    Alcohol Level: No results found for this basename: ETH:2 in the last 72 hours Urinalysis:  Basename 02/13/12 1506  COLORURINE AMBER*  LABSPEC 1.024  PHURINE 5.5  GLUCOSEU NEGATIVE  HGBUR NEGATIVE  BILIRUBINUR LARGE*  KETONESUR 15*  PROTEINUR 30*  UROBILINOGEN 1.0  NITRITE NEGATIVE  LEUKOCYTESUR TRACE*    Recent Results (from the past 240 hour(s))  URINE CULTURE     Status: Normal   Collection Time   02/13/12  3:06 PM      Component Value Range Status Comment   Specimen Description URINE, CLEAN CATCH   Final    Special Requests NONE   Final    Culture  Setup  Time [id number]   Final    Colony Count 90,000 COLONIES/ML   Final    Culture YEAST   Final    Report Status 02/15/2012 FINAL   Final   CLOSTRIDIUM DIFFICILE BY PCR     Status: Normal   Collection Time   02/15/12 10:47 AM      Component Value Range Status Comment   C difficile by pcr NEGATIVE  NEGATIVE  Final   CULTURE, ROUTINE-ABSCESS     Status: Normal (Preliminary result)   Collection Time   02/15/12 11:01 AM      Component Value Range Status Comment   Specimen Description ABDOMEN RIGHT UPPER QUADRANT   Final    Special Requests  Normal   Final    Gram Stain PENDING   Incomplete    Culture Culture reincubated for better growth   Final    Report Status PENDING   Incomplete     Studies/Results: Ct Guided Abscess Drain  02/15/2012  *RADIOLOGY REPORT*  Clinical history:71 year-old female with probable inflammatory process in the right upper quadrant.  PROCEDURE(S): CT GUIDED CHOLECYSTOSTOMY TUBE PLACEMENT.  Physician: Rachelle Hora. Henn, MD  Medications:Versed 2 mg, Fentanyl 100 mcg. A radiology nurse monitored the patient for moderate sedation.  Procedure:The procedure was explained to the patient.  The risks and benefits of the procedure were discussed and the patient's questions were addressed.  Informed consent was obtained from the patient.  The patient's right upper quadrant was initially evaluated with ultrasound.  Ultrasound suggested an abnormal and distended gallbladder.  Findings were thought to be related to cholecystitis.  CT images of the upper abdomen were obtained.  The right upper abdomen was prepped and draped in a sterile fashion. The skin was anesthetized with lidocaine. An 18 gauge needle was directed through the right hepatic lobe and into the distended gallbladder.  Thick yellow fluid was aspirated.  A stiff Amplatz wire was placed.  The CT images confirmed placement within the gallbladder.  The tract was dilated and a 10-French multipurpose drain was reconstituted within the gallbladder.  180 ml of thick yellow purulent fluid was removed.  Catheter sutured to the skin and attached to a suction bulb.  Findings:The gallbladder appears to be markedly abnormal and there are surrounding low density structures adjacent to the duodenum and the right colon. These low density structures likely represent inflammatory or abscess collections.  Yellow purulent fluid removed from the gallbladder.  Findings are suggestive for severe cholecystitis and perforation.  Complications: None  Impression:CT guided placement of a percutaneous  cholecystostomy tube.  180 ml of purulent fluid was removed.  Samples were sent for Gram stain, culture and cytology.  Original Report Authenticated By: Richarda Overlie, M.D.    Medications: Scheduled Meds:    . imipenem-cilastatin  500 mg Intravenous Q6H  . insulin aspart  0-9 Units Subcutaneous TID AC & HS  . ondansetron      . potassium chloride  10 mEq Intravenous Q1 Hr x 2  . DISCONTD: imipenem-cilastatin  250 mg Intravenous Q6H  . DISCONTD: insulin aspart  0-9 Units Subcutaneous Q4H   Continuous Infusions:    . 0.9 % NaCl with KCl 40 mEq / L 50 mL/hr at 02/16/12 1110   PRN Meds:.HYDROcodone-acetaminophen, menthol-cetylpyridinium, morphine injection, ondansetron, phenol  Assessment/Plan: 1. Walled Perforated cholecystitis w/ abscess:  on Imipenem Day 4, continue IVF S/p Ct guided drainage 4/13 with thick pus, 51F drain placed,  Also concerning for cholecystitis w/ abscess  Timing of  Cholecystectomy per [facility] Diet being advanced 2. Acute renal failure : resolved secondary to ATN, dehydration and being on ARB, metformin and diuretic.  Cut down IVF today, getting KCL in IVF  3. DM: stable, Stopped metformin, Novolog SSI AC and HS since she is eating  4. HTN: stable, no need for antihypertensives now.  5. Hypokalemia: improved, getting potassium in IVF 6. Periop-cardiac risk: low to moderate, negative myoview in 2005. Asymptomatic and no acute EKG changes.    LOS: 3 days   [facility] Pager: [phone] 02/16/2012, 12:27 PM

## 2012-02-16 NOTE — Progress Notes (Signed)
Addendum to prior note. We will advance her diet to carbohydrate modified as she was taking full liquids this morning.Will also adjust her CBG monitoring to a.c. And at bedtime with continued insulin coverage. Also note that her hypokalemia appears corrected this morning.

## 2012-02-16 NOTE — Progress Notes (Signed)
2 Days Post-Op  Subjective: Feels a little worn out today.  No further diarrhea.  Wanting to drink more than eat.   Objective: Vital signs in last 24 hours: Temp:  [97.7 F (36.5 C)-98.7 F (37.1 C)] 97.9 F (36.6 C) (04/14 0400) Pulse Rate:  [86-100] 86  (04/14 0400) Resp:  [9-36] 22  (04/14 0400) BP: (98-126)/(47-73) 99/61 mmHg (04/14 0400) SpO2:  [93 %-100 %] 97 % (04/14 0400) Last BM Date: 02/15/12  Intake/Output from previous day: 04/13 0701 - 04/14 0700 In: 3078.3 [P.O.:480; I.V.:2298.3; IV Piggyback:300] Out: 1030 [Urine:900; Drains:130] Intake/Output this shift: Total I/O In: 360 [P.O.:360] Out: -   PE:  Alert, little tired appearing.  Drain intact with insertion site clean and dry.  130 ml output earlier - mildly odorous today to me upon exam.  Abdomen soft, non-distended.  Positive bowel sounds. Drainage tan purulent in charged JP.   Lab Results:   [facility] 02/16/12 0518 02/15/12 0455  WBC 4.4 9.2  HGB 13.8 9.1*  HCT 43.6 28.5*  PLT 208 490*   BMET  Basename 02/16/12 0518 02/15/12 0455  NA 142 145  K 3.6 3.0*  CL 108 97  CO2 28 27  GLUCOSE 127* 113*  BUN 12 25*  CREATININE 0.62 0.83  CALCIUM 7.8* 8.4   PT/INR  Basename 02/15/12 0455  LABPROT 16.7*  INR 1.33   ABG No results found for this basename: PHART:2,PCO2:2,PO2:2,HCO3:2 in the last 72 hours  Studies/Results: Ct Guided Abscess Drain  02/15/2012  *RADIOLOGY REPORT*  Clinical history:71 year-old female with probable inflammatory process in the right upper quadrant.  PROCEDURE(S): CT GUIDED CHOLECYSTOSTOMY TUBE PLACEMENT.  Physician: Rachelle Hora. Henn, MD  Medications:Versed 2 mg, Fentanyl 100 mcg. A radiology nurse monitored the patient for moderate sedation.  Procedure:The procedure was explained to the patient.  The risks and benefits of the procedure were discussed and the patient's questions were addressed.  Informed consent was obtained from the patient.  The patient's right upper quadrant was  initially evaluated with ultrasound.  Ultrasound suggested an abnormal and distended gallbladder.  Findings were thought to be related to cholecystitis.  CT images of the upper abdomen were obtained.  The right upper abdomen was prepped and draped in a sterile fashion. The skin was anesthetized with lidocaine. An 18 gauge needle was directed through the right hepatic lobe and into the distended gallbladder.  Thick yellow fluid was aspirated.  A stiff Amplatz wire was placed.  The CT images confirmed placement within the gallbladder.  The tract was dilated and a 10-French multipurpose drain was reconstituted within the gallbladder.  180 ml of thick yellow purulent fluid was removed.  Catheter sutured to the skin and attached to a suction bulb.  Findings:The gallbladder appears to be markedly abnormal and there are surrounding low density structures adjacent to the duodenum and the right colon. These low density structures likely represent inflammatory or abscess collections.  Yellow purulent fluid removed from the gallbladder.  Findings are suggestive for severe cholecystitis and perforation.  Complications: None  Impression:CT guided placement of a percutaneous cholecystostomy tube.  180 ml of purulent fluid was removed.  Samples were sent for Gram stain, culture and cytology.  Original Report Authenticated By: Richarda Overlie, M.D.    Anti-infectives: Anti-infectives     Start     Dose/Rate Route Frequency Ordered Stop   02/15/12 1600   imipenem-cilastatin (PRIMAXIN) 500 mg in sodium chloride 0.9 % 100 mL IVPB  500 mg 200 mL/hr over 30 Minutes Intravenous Every 6 hours 02/15/12 1236     02/14/12 1600   imipenem-cilastatin (PRIMAXIN) 250 mg in sodium chloride 0.9 % 100 mL IVPB  Status:  Discontinued        250 mg 200 mL/hr over 30 Minutes Intravenous Every 6 hours 02/14/12 1037 02/15/12 1236   02/13/12 2200   imipenem-cilastatin (PRIMAXIN) 250 mg in sodium chloride 0.9 % 100 mL IVPB  Status:   Discontinued        250 mg 200 mL/hr over 30 Minutes Intravenous Every 12 hours 02/13/12 1756 02/14/12 1036   02/13/12 1715   ertapenem (INVANZ) 1 g in sodium chloride 0.9 % 50 mL IVPB        1 g 100 mL/hr over 30 Minutes Intravenous  Once 02/13/12 1711 02/13/12 1756          Assessment/Plan: S/p percutaneous drainage of RUQ collection - still uncertain if only GB involved, possible that colon is involved.  Will hopefully be able to determine over next few days - will re-image once output declines.  Still to continue abx as patient on antibiotics prior to admit - likely false negative cultures.  Will start flushes prn for poor drainage if needed - discussed with RN. IR to continue to follow.    LOS: 3 days    Miabella Shannahan D 02/16/2012

## 2012-02-16 NOTE — Progress Notes (Signed)
Nausea & vomiting  Subjective: She feels okay today. She is tolerated clear liquid diet. She's not having any significant pain. She's noticed a little odor to the drainage when the drainage changed. Overall she feels fine.  Objective: Vital signs in last 24 hours: Temp:  [97.7 F (36.5 C)-98.7 F (37.1 C)] 97.9 F (36.6 C) (04/14 0400) Pulse Rate:  [86-100] 86  (04/14 0400) Resp:  [9-36] 22  (04/14 0400) BP: (98-126)/(47-73) 99/61 mmHg (04/14 0400) SpO2:  [93 %-100 %] 97 % (04/14 0400) Last BM Date: 02/15/12  Intake/Output from previous day: 04/13 0701 - 04/14 0700 In: 3078.3 [P.O.:480; I.V.:2298.3; IV Piggyback:300] Out: 1030 [Urine:900; Drains:130] Intake/Output this shift: Total I/O In: 360 [P.O.:360] Out: -   General appearance: alert, cooperative and no distress Resp: clear to auscultation bilaterally GI: her abdomen is soft and nontender. There are no masses. She's not distended. Bowel sounds are normal. the drain continues to drain although the color has changed a little bit and there is some brownish tinge to it hopefully this is just some bile staining  Lab Results:  Results for orders placed during the hospital encounter of 02/13/12 (from the past 24 hour(s))  CLOSTRIDIUM DIFFICILE BY PCR     Status: Normal   Collection Time   02/15/12 10:47 AM      Component Value Range   C difficile by pcr NEGATIVE  NEGATIVE   CULTURE, ROUTINE-ABSCESS     Status: Normal (Preliminary result)   Collection Time   02/15/12 11:01 AM      Component Value Range   Specimen Description ABDOMEN RIGHT UPPER QUADRANT     Special Requests Normal     Gram Stain PENDING     Culture Culture reincubated for better growth     Report Status PENDING    GLUCOSE, CAPILLARY     Status: Normal   Collection Time   02/15/12 12:04 PM      Component Value Range   Glucose-Capillary 94  70 - 99 (mg/dL)  GLUCOSE, CAPILLARY     Status: Abnormal   Collection Time   02/15/12  4:04 PM      Component Value  Range   Glucose-Capillary 226 (*) 70 - 99 (mg/dL)  GLUCOSE, CAPILLARY     Status: Abnormal   Collection Time   02/15/12  8:16 PM      Component Value Range   Glucose-Capillary 173 (*) 70 - 99 (mg/dL)   Comment 1 Notify RN    GLUCOSE, CAPILLARY     Status: Abnormal   Collection Time   02/15/12 11:37 PM      Component Value Range   Glucose-Capillary 154 (*) 70 - 99 (mg/dL)   Comment 1 Notify RN    GLUCOSE, CAPILLARY     Status: Abnormal   Collection Time   02/16/12  3:47 AM      Component Value Range   Glucose-Capillary 111 (*) 70 - 99 (mg/dL)   Comment 1 Notify RN    BASIC METABOLIC PANEL     Status: Abnormal   Collection Time   02/16/12  5:18 AM      Component Value Range   Sodium 142  135 - 145 (mEq/L)   Potassium 3.6  3.5 - 5.1 (mEq/L)   Chloride 108  96 - 112 (mEq/L)   CO2 28  19 - 32 (mEq/L)   Glucose, Bld 127 (*) 70 - 99 (mg/dL)   BUN 12  6 - 23 (mg/dL)   Creatinine, Ser  0.62  0.50 - 1.10 (mg/dL)   Calcium 7.8 (*) 8.4 - 10.5 (mg/dL)   GFR calc non Af Amer 89 (*) >90 (mL/min)   GFR calc Af Amer >90  >90 (mL/min)  CBC     Status: Abnormal   Collection Time   02/16/12  5:18 AM      Component Value Range   WBC 4.4  4.0 - 10.5 (K/uL)   RBC 5.47 (*) 3.87 - 5.11 (MIL/uL)   Hemoglobin 13.8  12.0 - 15.0 (g/dL)   HCT 16.1  09.6 - 04.5 (%)   MCV 79.7  78.0 - 100.0 (fL)   MCH 25.2 (*) 26.0 - 34.0 (pg)   MCHC 31.7  30.0 - 36.0 (g/dL)   RDW 40.9 (*) 81.1 - 15.5 (%)   Platelets 208  150 - 400 (K/uL)  GLUCOSE, CAPILLARY     Status: Abnormal   Collection Time   02/16/12  7:45 AM      Component Value Range   Glucose-Capillary 129 (*) 70 - 99 (mg/dL)     Studies/Results Radiology     MEDS, Scheduled    . imipenem-cilastatin  500 mg Intravenous Q6H  . insulin aspart  0-9 Units Subcutaneous Q4H  . ondansetron      . potassium chloride  10 mEq Intravenous Q1 Hr x 2  . DISCONTD: imipenem-cilastatin  250 mg Intravenous Q6H     Assessment: Nausea & vomiting Adequate  drainage of right upper quadrant abscess of uncertain etiology  Plan: I believe he can advance her diet to full liquids as the clear liquid diet has been tolerated well. We will continue IV antibiotics. She will need a repeat CT scan in a few days. I think we need to reevaluate with a colonoscopy at some point since I am not completely clear this actually started with her gallbladder. However this lady be deferred until the current issues are resolved. I discussed the plans with her so she understands that she'll be here for a few more days. She also understands she may well need surgical intervention at some point.  LOS: 3 days    Currie Paris, MD, [facility], [place] [phone]   02/16/2012 9:48 AM

## 2012-02-17 ENCOUNTER — Encounter (HOSPITAL_COMMUNITY): Payer: Self-pay | Admitting: Gastroenterology

## 2012-02-17 LAB — CBC
HCT: 27.2 % — ABNORMAL LOW (ref 36.0–46.0)
Hemoglobin: 8.8 g/dL — ABNORMAL LOW (ref 12.0–15.0)
MCH: 26 pg (ref 26.0–34.0)
MCHC: 32.4 g/dL (ref 30.0–36.0)
MCV: 80.2 fL (ref 78.0–100.0)
Platelets: 347 10*3/uL (ref 150–400)
RBC: 3.39 MIL/uL — ABNORMAL LOW (ref 3.87–5.11)
RDW: 16.3 % — ABNORMAL HIGH (ref 11.5–15.5)
WBC: 5.3 10*3/uL (ref 4.0–10.5)

## 2012-02-17 LAB — GLUCOSE, CAPILLARY
Glucose-Capillary: 116 mg/dL — ABNORMAL HIGH (ref 70–99)
Glucose-Capillary: 128 mg/dL — ABNORMAL HIGH (ref 70–99)
Glucose-Capillary: 113 mg/dL — ABNORMAL HIGH (ref 70–99)
Glucose-Capillary: 169 mg/dL — ABNORMAL HIGH (ref 70–99)

## 2012-02-17 LAB — TYPE AND SCREEN
ABO/RH(D): A POS
Antibody Screen: NEGATIVE

## 2012-02-17 LAB — HEMOGLOBIN AND HEMATOCRIT, BLOOD
HCT: 31 % — ABNORMAL LOW (ref 36.0–46.0)
Hemoglobin: 10.1 g/dL — ABNORMAL LOW (ref 12.0–15.0)

## 2012-02-17 LAB — ABO/RH: ABO/RH(D): A POS

## 2012-02-17 NOTE — Progress Notes (Signed)
3 Days Post-Op  Subjective: Says she feels better,, Feels warm on palpation, but temp 98.6 oral. Taking a regular diet and +BM.  Objective: Vital signs in last 24 hours: Temp:  [97.4 F (36.3 C)-97.9 F (36.6 C)] 97.8 F (36.6 C) (04/15 0554) Pulse Rate:  [86-105] 105  (04/15 0554) Resp:  [20] 20  (04/15 0554) BP: (100-123)/(51-69) 123/69 mmHg (04/15 0554) SpO2:  [97 %-99 %] 97 % (04/15 0554) Last BM Date: 02/15/12 Afebrile VSS, h/h down,  Intake/Output from previous day: 04/14 0701 - 04/15 0700 In: 2396.7 [P.O.:600; I.V.:1396.7; IV Piggyback:400] Out: 1190 [Urine:1100; Drains:90] Intake/Output this shift: Total I/O In: 240 [P.O.:240] Out: 300 [Urine:300]  General appearance: alert, cooperative and no distress Resp: clear to auscultation bilaterally GI: soft, non-tender; bowel sounds normal; no masses,  no organomegaly and Drain is showing white purulent appearing fluid. 90 ml recorded yesterday.  Lab Results:   Basename 02/17/12 0455 02/16/12 0518  WBC 5.3 4.4  HGB 8.8* 13.8  HCT 27.2* 43.6  PLT 347 208    BMET  Basename 02/16/12 0518 02/15/12 0455  NA 142 145  K 3.6 3.0*  CL 108 97  CO2 28 27  GLUCOSE 127* 113*  BUN 12 25*  CREATININE 0.62 0.83  CALCIUM 7.8* 8.4   PT/INR  Basename 02/15/12 0455  LABPROT 16.7*  INR 1.33     Lab 02/14/12 0432 02/13/12 1415  AST 20 20  ALT 15 24  ALKPHOS 107 140*  BILITOT 0.4 0.6  PROT 6.4 8.1  ALBUMIN 2.4* 3.0*     Lipase     Component Value Date/Time   LIPASE 42 02/13/2012 1415     Studies/Results: Ct Guided Abscess Drain  02/15/2012  *RADIOLOGY REPORT*  Clinical history:71 year-old female with probable inflammatory process in the right upper quadrant.  PROCEDURE(S): CT GUIDED CHOLECYSTOSTOMY TUBE PLACEMENT.  Physician: Rachelle Hora. Henn, MD  Medications:Versed 2 mg, Fentanyl 100 mcg. A radiology nurse monitored the patient for moderate sedation.  Procedure:The procedure was explained to the patient.  The risks  and benefits of the procedure were discussed and the patient's questions were addressed.  Informed consent was obtained from the patient.  The patient's right upper quadrant was initially evaluated with ultrasound.  Ultrasound suggested an abnormal and distended gallbladder.  Findings were thought to be related to cholecystitis.  CT images of the upper abdomen were obtained.  The right upper abdomen was prepped and draped in a sterile fashion. The skin was anesthetized with lidocaine. An 18 gauge needle was directed through the right hepatic lobe and into the distended gallbladder.  Thick yellow fluid was aspirated.  A stiff Amplatz wire was placed.  The CT images confirmed placement within the gallbladder.  The tract was dilated and a 10-French multipurpose drain was reconstituted within the gallbladder.  180 ml of thick yellow purulent fluid was removed.  Catheter sutured to the skin and attached to a suction bulb.  Findings:The gallbladder appears to be markedly abnormal and there are surrounding low density structures adjacent to the duodenum and the right colon. These low density structures likely represent inflammatory or abscess collections.  Yellow purulent fluid removed from the gallbladder.  Findings are suggestive for severe cholecystitis and perforation.  Complications: None  Impression:CT guided placement of a percutaneous cholecystostomy tube.  180 ml of purulent fluid was removed.  Samples were sent for Gram stain, culture and cytology.  Original Report Authenticated By: Richarda Overlie, M.D.    Medications:    .  imipenem-cilastatin  500 mg Intravenous Q6H  . insulin aspart  0-9 Units Subcutaneous TID AC & HS  . DISCONTD: insulin aspart  0-9 Units Subcutaneous Q4H    Assessment/Plan Nausea & vomiting  RUQ abscess, most likely gangrenous cholecystitis based on sono appearance today. S/p EGD to r/o gastric obstruction, negative 4/12 DR. Ganem. IR  CT guided drainage of RUQ abscess 02/15/12 180  ml; culture pending abundant GM negative rods on culture. C. Diff negative 4/13 Dyslipidemia Diabetes Mellitis Hypertension H/H down 13.8/43 (4/14) this AM down to 8.8/27.2 (4/15)    Plan:  Im going to repeat H/H at 11AM and tomorrow, continue antibiotics, mobilize.  Culture results still pending.  LOS: 4 days    Gavyn Zoss 02/17/2012

## 2012-02-17 NOTE — Progress Notes (Signed)
Subjective: Doing well, without pain/N/V, tolerating regular diet Had a BM yesterday  Objective: Vital signs in last 24 hours: Temp:  [97.4 F (36.3 C)-97.9 F (36.6 C)] 97.8 F (36.6 C) (04/15 0554) Pulse Rate:  [86-105] 105  (04/15 0554) Resp:  [20] 20  (04/15 0554) BP: (100-123)/(51-69) 123/69 mmHg (04/15 0554) SpO2:  [97 %-99 %] 97 % (04/15 0554) Weight change:  Last BM Date: 02/15/12  Intake/Output from previous day: 04/14 0701 - 04/15 0700 In: 2396.7 [P.O.:600; I.V.:1396.7; IV Piggyback:400] Out: 1190 [Urine:1100; Drains:90] Total I/O In: 460 [P.O.:360; IV Piggyback:100] Out: 700 [Urine:700]   Physical Exam: General: Alert, awake, oriented x3, in no acute distress. HEENT: No bruits, no goiter. Heart: Regular rate and rhythm, without murmurs, rubs, gallops. Lungs: Clear to auscultation bilaterally. Abdomen: Soft, distended, mild R sided tenderness, diminished bowel sounds, drain with yellow pus Extremities: No clubbing cyanosis or edema with positive pedal pulses. Neuro: Grossly intact, nonfocal.    Lab Results: Basic Metabolic Panel:  Basename 02/16/12 0518 02/15/12 0455  NA 142 145  K 3.6 3.0*  CL 108 97  CO2 28 27  GLUCOSE 127* 113*  BUN 12 25*  CREATININE 0.62 0.83  CALCIUM 7.8* 8.4  MG -- --  PHOS -- --   Liver Function Tests: No results found for this basename: AST:2,ALT:2,ALKPHOS:2,BILITOT:2,PROT:2,ALBUMIN:2 in the last 72 hours No results found for this basename: LIPASE:2,AMYLASE:2 in the last 72 hours No results found for this basename: AMMONIA:2 in the last 72 hours CBC:  Basename 02/17/12 0455 02/16/12 0518  WBC 5.3 QUESTIONABLE RESULTS, RECOMMEND RECOLLECT TO VERIFY  NEUTROABS -- --  HGB 8.8* QUESTIONABLE RESULTS, RECOMMEND RECOLLECT TO VERIFY  HCT 27.2* QUESTIONABLE RESULTS, RECOMMEND RECOLLECT TO VERIFY  MCV 80.2 QUESTIONABLE RESULTS, RECOMMEND RECOLLECT TO VERIFY  PLT 347 QUESTIONABLE RESULTS, RECOMMEND RECOLLECT TO VERIFY    Cardiac Enzymes: No results found for this basename: CKTOTAL:3,CKMB:3,CKMBINDEX:3,TROPONINI:3 in the last 72 hours BNP: No results found for this basename: PROBNP:3 in the last 72 hours D-Dimer: No results found for this basename: DDIMER:2 in the last 72 hours CBG:  Basename 02/17/12 0741 02/16/12 2114 02/16/12 1725 02/16/12 1144 02/16/12 0745 02/16/12 0347  GLUCAP 116* 160* 139* 114* 129* 111*   Hemoglobin A1C: No results found for this basename: HGBA1C in the last 72 hours Fasting Lipid Panel: No results found for this basename: CHOL,HDL,LDLCALC,TRIG,CHOLHDL,LDLDIRECT in the last 72 hours Thyroid Function Tests: No results found for this basename: TSH,T4TOTAL,FREET4,T3FREE,THYROIDAB in the last 72 hours Anemia Panel: No results found for this basename: VITAMINB12,FOLATE,FERRITIN,TIBC,IRON,RETICCTPCT in the last 72 hours Coagulation:  Basename 02/15/12 0455  LABPROT 16.7*  INR 1.33   Urine Drug Screen: Drugs of Abuse  No results found for this basename: labopia,  cocainscrnur,  labbenz,  amphetmu,  thcu,  labbarb    Alcohol Level: No results found for this basename: ETH:2 in the last 72 hours Urinalysis: No results found for this basename: COLORURINE:2,APPERANCEUR:2,LABSPEC:2,PHURINE:2,GLUCOSEU:2,HGBUR:2,BILIRUBINUR:2,KETONESUR:2,PROTEINUR:2,UROBILINOGEN:2,NITRITE:2,LEUKOCYTESUR:2 in the last 72 hours  Recent Results (from the past 240 hour(s))  URINE CULTURE     Status: Normal   Collection Time   02/13/12  3:06 PM      Component Value Range Status Comment   Specimen Description URINE, CLEAN CATCH   Final    Special Requests NONE   Final    Culture  Setup Time [id number]   Final    Colony Count 90,000 COLONIES/ML   Final    Culture YEAST   Final    Report Status 02/15/2012 FINAL  Final   CLOSTRIDIUM DIFFICILE BY PCR     Status: Normal   Collection Time   02/15/12 10:47 AM      Component Value Range Status Comment   C difficile by pcr NEGATIVE  NEGATIVE  Final    CULTURE, ROUTINE-ABSCESS     Status: Normal (Preliminary result)   Collection Time   02/15/12 11:01 AM      Component Value Range Status Comment   Specimen Description ABDOMEN RIGHT UPPER QUADRANT   Final    Special Requests Normal   Final    Gram Stain     Final    Value: MODERATE WBC PRESENT, PREDOMINANTLY PMN     NO SQUAMOUS EPITHELIAL CELLS SEEN     FEW GRAM NEGATIVE RODS   Culture ABUNDANT GRAM NEGATIVE RODS   Final    Report Status PENDING   Incomplete     Studies/Results: No results found.  Medications: Scheduled Meds:    . imipenem-cilastatin  500 mg Intravenous Q6H  . insulin aspart  0-9 Units Subcutaneous TID AC & HS   Continuous Infusions:    . 0.9 % NaCl with KCl 40 mEq / L 50 mL/hr at 02/17/12 0823   PRN Meds:.HYDROcodone-acetaminophen, menthol-cetylpyridinium, morphine injection, ondansetron, phenol  Assessment/Plan: 1. Suspected walled perforated cholecystitis w/ abscess:  on Imipenem Day 5, cut down IVF S/p Ct guided drainage 4/13 with thick pus, 29F drain placed,  Also concerning for cholecystitis w/ abscess  Interval Cholecystectomy per [facility] Diet  Advanced to regular Drop in Hb, suspect yesterday's Hb of 14 was lab error, otherwise Hb pretty stable through most hospitalization 2. Acute renal failure : resolved secondary to ATN, dehydration and being on ARB, metformin and diuretic.  Cut down IVF today, still getting KCL in IVF  3. DM: stable, Stopped metformin, Novolog SSI AC and HS since she is eating  4. HTN: stable, no need for antihypertensives now.  5. Hypokalemia: improved, getting potassium in IVF 6. Periop-cardiac risk: low to moderate, negative myoview in 2005. Asymptomatic and no acute EKG changes. Ambulate, PT eval DC Planning   LOS: 4 days   [facility] Pager: [phone] 02/17/2012, 11:55 AM

## 2012-02-17 NOTE — Progress Notes (Signed)
Informed Dr. Jomarie Longs and Dr. Gerrit Friends about Sunday 4/14 questionable hemoglobin results of 13.7 per request of lab.  No orders at this time.  Barrie Lyme 11:35 AM 02/17/2012

## 2012-02-17 NOTE — Progress Notes (Signed)
UR complete 

## 2012-02-17 NOTE — Progress Notes (Signed)
General Surgery [facility] - Attending  Patient seen and examined.  Events noted and EGD report reviewed.  Drain with cloudy white thin drainage this AM.  Abdominal exam benign, non-tender.  Patient taking regular diet comfortably this AM.  If continued progress, may discharge home later this week and schedule cholecystectomy in approximately 6 weeks.  Drain to remain in position in the interim.  If patient deteriorates clinically, will need laparotomy.  Will follow closely.  Velora Heckler, MD, [facility] Office: [phone]

## 2012-02-17 NOTE — Progress Notes (Signed)
3 Days Post-Op  Subjective: Patient feeling better, currently eating lunch; no nausea/vomiting; some RUQ soreness secondary to drain  Objective: Vital signs in last 24 hours: Temp:  [97.4 F (36.3 C)-97.9 F (36.6 C)] 97.8 F (36.6 C) (04/15 0554) Pulse Rate:  [86-105] 105  (04/15 0554) Resp:  [20] 20  (04/15 0554) BP: (100-123)/(51-69) 123/69 mmHg (04/15 0554) SpO2:  [97 %-99 %] 97 % (04/15 0554) Last BM Date: 02/15/12  Intake/Output from previous day: 04/14 0701 - 04/15 0700 In: 2396.7 [P.O.:600; I.V.:1396.7; IV Piggyback:400] Out: 1190 [Urine:1100; Drains:90] Intake/Output this shift: Total I/O In: 460 [P.O.:360; IV Piggyback:100] Out: 700 [Urine:700] GB drain intact, insertion site ok, mod tender to palpation, 90+cc's cloudy beige fluid in J-P bulb; cx's pend   Lab Results:   Basename 02/17/12 1125 02/17/12 0455 02/16/12 0518  WBC -- 5.3 QUESTIONABLE RESULTS, RECOMMEND RECOLLECT TO VERIFY  HGB 10.1* 8.8* --  HCT 31.0* 27.2* --  PLT -- 347 QUESTIONABLE RESULTS, RECOMMEND RECOLLECT TO VERIFY   BMET  [facility] 02/16/12 0518 02/15/12 0455  NA 142 145  K 3.6 3.0*  CL 108 97  CO2 28 27  GLUCOSE 127* 113*  BUN 12 25*  CREATININE 0.62 0.83  CALCIUM 7.8* 8.4   PT/INR  Basename 02/15/12 0455  LABPROT 16.7*  INR 1.33   ABG No results found for this basename: PHART:2,PCO2:2,PO2:2,HCO3:2 in the last 72 hours  Studies/Results: No results found. Results for orders placed during the hospital encounter of 02/13/12  URINE CULTURE     Status: Normal   Collection Time   02/13/12  3:06 PM      Component Value Range Status Comment   Specimen Description URINE, CLEAN CATCH   Final    Special Requests NONE   Final    Culture  Setup Time [id number]   Final    Colony Count 90,000 COLONIES/ML   Final    Culture YEAST   Final    Report Status 02/15/2012 FINAL   Final   CLOSTRIDIUM DIFFICILE BY PCR     Status: Normal   Collection Time   02/15/12 10:47 AM      Component  Value Range Status Comment   C difficile by pcr NEGATIVE  NEGATIVE  Final   CULTURE, ROUTINE-ABSCESS     Status: Normal (Preliminary result)   Collection Time   02/15/12 11:01 AM      Component Value Range Status Comment   Specimen Description ABDOMEN RIGHT UPPER QUADRANT   Final    Special Requests Normal   Final    Gram Stain     Final    Value: MODERATE WBC PRESENT, PREDOMINANTLY PMN     NO SQUAMOUS EPITHELIAL CELLS SEEN     FEW GRAM NEGATIVE RODS   Culture ABUNDANT GRAM NEGATIVE RODS   Final    Report Status PENDING   Incomplete     Anti-infectives: Anti-infectives     Start     Dose/Rate Route Frequency Ordered Stop   02/15/12 1600   imipenem-cilastatin (PRIMAXIN) 500 mg in sodium chloride 0.9 % 100 mL IVPB        500 mg 200 mL/hr over 30 Minutes Intravenous Every 6 hours 02/15/12 1236     02/14/12 1600   imipenem-cilastatin (PRIMAXIN) 250 mg in sodium chloride 0.9 % 100 mL IVPB  Status:  Discontinued        250 mg 200 mL/hr over 30 Minutes Intravenous Every 6 hours 02/14/12 1037 02/15/12 1236   02/13/12 2200  imipenem-cilastatin (PRIMAXIN) 250 mg in sodium chloride 0.9 % 100 mL IVPB  Status:  Discontinued        250 mg 200 mL/hr over 30 Minutes Intravenous Every 12 hours 02/13/12 1756 02/14/12 1036   02/13/12 1715   ertapenem (INVANZ) 1 g in sodium chloride 0.9 % 50 mL IVPB        1 g 100 mL/hr over 30 Minutes Intravenous  Once 02/13/12 1711 02/13/12 1756          Assessment/Plan: s/p perc cholecystostomy 4/13; cont current tx, check final cx's; other plans as outlined by [facility]    LOS: 4 days    Kelly Farmer,D [facility] 02/17/2012

## 2012-02-18 DIAGNOSIS — M7989 Other specified soft tissue disorders: Secondary | ICD-10-CM | POA: Diagnosis not present

## 2012-02-18 LAB — GLUCOSE, CAPILLARY
Glucose-Capillary: 115 mg/dL — ABNORMAL HIGH (ref 70–99)
Glucose-Capillary: 140 mg/dL — ABNORMAL HIGH (ref 70–99)
Glucose-Capillary: 140 mg/dL — ABNORMAL HIGH (ref 70–99)
Glucose-Capillary: 178 mg/dL — ABNORMAL HIGH (ref 70–99)

## 2012-02-18 LAB — CBC
HCT: 26.7 % — ABNORMAL LOW (ref 36.0–46.0)
Hemoglobin: 8.6 g/dL — ABNORMAL LOW (ref 12.0–15.0)
MCH: 26.1 pg (ref 26.0–34.0)
MCHC: 32.2 g/dL (ref 30.0–36.0)
MCV: 80.9 fL (ref 78.0–100.0)
Platelets: 307 10*3/uL (ref 150–400)
RBC: 3.3 MIL/uL — ABNORMAL LOW (ref 3.87–5.11)
RDW: 16.2 % — ABNORMAL HIGH (ref 11.5–15.5)
WBC: 4.6 10*3/uL (ref 4.0–10.5)

## 2012-02-18 LAB — COMPREHENSIVE METABOLIC PANEL
ALT: 14 U/L (ref 0–35)
AST: 17 U/L (ref 0–37)
Albumin: 1.9 g/dL — ABNORMAL LOW (ref 3.5–5.2)
Alkaline Phosphatase: 76 U/L (ref 39–117)
BUN: 6 mg/dL (ref 6–23)
CO2: 22 mEq/L (ref 19–32)
Calcium: 8 mg/dL — ABNORMAL LOW (ref 8.4–10.5)
Chloride: 108 mEq/L (ref 96–112)
Creatinine, Ser: 0.56 mg/dL (ref 0.50–1.10)
GFR calc Af Amer: >90 mL/min (ref >90)
GFR calc non Af Amer: >90 mL/min (ref >90)
Glucose, Bld: 99 mg/dL (ref 70–99)
Potassium: 3.8 mEq/L (ref 3.5–5.1)
Sodium: 139 mEq/L (ref 135–145)
Total Bilirubin: 0.2 mg/dL — ABNORMAL LOW (ref 0.3–1.2)
Total Protein: 5.1 g/dL — ABNORMAL LOW (ref 6.0–8.3)

## 2012-02-18 LAB — CULTURE, ROUTINE-ABSCESS: Special Requests: NORMAL

## 2012-02-18 LAB — LIPASE, BLOOD: Lipase: 76 U/L — ABNORMAL HIGH (ref 11–59)

## 2012-02-18 MED ORDER — METFORMIN HCL 500 MG PO TABS
500.0000 mg | ORAL_TABLET | Freq: Every day | ORAL | Status: DC
  Administered 2012-02-19 – 2012-02-21 (×2): 500 mg via ORAL
  Filled 2012-02-18 (×3): qty 1

## 2012-02-18 MED ORDER — ENOXAPARIN SODIUM 40 MG/0.4ML ~~LOC~~ SOLN
40.0000 mg | SUBCUTANEOUS | Status: DC
  Administered 2012-02-18 – 2012-02-20 (×3): 40 mg via SUBCUTANEOUS
  Filled 2012-02-18 (×4): qty 0.4

## 2012-02-18 NOTE — Progress Notes (Signed)
Flushed drain with 10ml NS

## 2012-02-18 NOTE — Progress Notes (Addendum)
Subjective: Doing well, without pain/N/V, tolerating regular diet Having BMs, ambulated in hall yesterday  Objective: Vital signs in last 24 hours: Temp:  [97.2 F (36.2 C)-98.4 F (36.9 C)] 97.2 F (36.2 C) (04/16 0544) Pulse Rate:  [90-98] 90  (04/16 0544) Resp:  [18-20] 20  (04/16 0544) BP: (100-118)/(63-73) 115/73 mmHg (04/16 0544) SpO2:  [99 %-100 %] 100 % (04/16 0544) Weight change:  Last BM Date: 02/17/12  Intake/Output from previous day: 04/15 0701 - 04/16 0700 In: 1422.7 [P.O.:840; I.V.:382.7; IV Piggyback:200] Out: 1810 [Urine:1800; Drains:10] Total I/O In: 100 [P.O.:100] Out: -    Physical Exam: General: Alert, awake, oriented x3, in no acute distress. HEENT: No bruits, no goiter. Heart: Regular rate and rhythm, without murmurs, rubs, gallops. Lungs: Clear to auscultation bilaterally. Abdomen: Soft, distended, mild R sided tenderness, diminished bowel sounds, drain with yellow pus Extremities: No clubbing cyanosis or edema with positive pedal pulses. Neuro: Grossly intact, nonfocal.    Lab Results: Basic Metabolic Panel:  Basename 02/18/12 0420 02/16/12 0518  NA 139 142  K 3.8 3.6  CL 108 108  CO2 22 28  GLUCOSE 99 127*  BUN 6 12  CREATININE 0.56 0.62  CALCIUM 8.0* 7.8*  MG -- --  PHOS -- --   Liver Function Tests:  [facility] 02/18/12 0420  AST 17  ALT 14  ALKPHOS 76  BILITOT 0.2*  PROT 5.1*  ALBUMIN 1.9*    Basename 02/18/12 0420  LIPASE 76*  AMYLASE --   No results found for this basename: AMMONIA:2 in the last 72 hours CBC:  Basename 02/18/12 0420 02/17/12 1125 02/17/12 0455  WBC 4.6 -- 5.3  NEUTROABS -- -- --  HGB 8.6* 10.1* --  HCT 26.7* 31.0* --  MCV 80.9 -- 80.2  PLT 307 -- 347   Cardiac Enzymes: No results found for this basename: CKTOTAL:3,CKMB:3,CKMBINDEX:3,TROPONINI:3 in the last 72 hours BNP: No results found for this basename: PROBNP:3 in the last 72 hours D-Dimer: No results found for this basename: DDIMER:2 in  the last 72 hours CBG:  Basename 02/18/12 0726 02/17/12 2124 02/17/12 1715 02/17/12 1220 02/17/12 0741 02/16/12 2114  GLUCAP 115* 169* 113* 128* 116* 160*   Hemoglobin A1C: No results found for this basename: HGBA1C in the last 72 hours Fasting Lipid Panel: No results found for this basename: CHOL,HDL,LDLCALC,TRIG,CHOLHDL,LDLDIRECT in the last 72 hours Thyroid Function Tests: No results found for this basename: TSH,T4TOTAL,FREET4,T3FREE,THYROIDAB in the last 72 hours Anemia Panel: No results found for this basename: VITAMINB12,FOLATE,FERRITIN,TIBC,IRON,RETICCTPCT in the last 72 hours Coagulation: No results found for this basename: LABPROT:2,INR:2 in the last 72 hours Urine Drug Screen: Drugs of Abuse  No results found for this basename: labopia,  cocainscrnur,  labbenz,  amphetmu,  thcu,  labbarb    Alcohol Level: No results found for this basename: ETH:2 in the last 72 hours Urinalysis: No results found for this basename: COLORURINE:2,APPERANCEUR:2,LABSPEC:2,PHURINE:2,GLUCOSEU:2,HGBUR:2,BILIRUBINUR:2,KETONESUR:2,PROTEINUR:2,UROBILINOGEN:2,NITRITE:2,LEUKOCYTESUR:2 in the last 72 hours  Recent Results (from the past 240 hour(s))  URINE CULTURE     Status: Normal   Collection Time   02/13/12  3:06 PM      Component Value Range Status Comment   Specimen Description URINE, CLEAN CATCH   Final    Special Requests NONE   Final    Culture  Setup Time [id number]   Final    Colony Count 90,000 COLONIES/ML   Final    Culture YEAST   Final    Report Status 02/15/2012 FINAL   Final   CLOSTRIDIUM DIFFICILE  BY PCR     Status: Normal   Collection Time   02/15/12 10:47 AM      Component Value Range Status Comment   C difficile by pcr NEGATIVE  NEGATIVE  Final   CULTURE, ROUTINE-ABSCESS     Status: Normal   Collection Time   02/15/12 11:01 AM      Component Value Range Status Comment   Specimen Description ABDOMEN RIGHT UPPER QUADRANT   Final    Special Requests Normal   Final    Gram  Stain     Final    Value: MODERATE WBC PRESENT, PREDOMINANTLY PMN     NO SQUAMOUS EPITHELIAL CELLS SEEN     FEW GRAM NEGATIVE RODS   Culture ABUNDANT ESCHERICHIA COLI   Final    Report Status 02/18/2012 FINAL   Final    Organism ID, Bacteria ESCHERICHIA COLI   Final     Studies/Results: No results found.  Medications: Scheduled Meds:    . imipenem-cilastatin  500 mg Intravenous Q6H  . insulin aspart  0-9 Units Subcutaneous TID AC & HS   Continuous Infusions:    . DISCONTD: 0.9 % NaCl with KCl 40 mEq / L 10 mL/hr (02/17/12 1312)   PRN Meds:.HYDROcodone-acetaminophen, menthol-cetylpyridinium, morphine injection, ondansetron, phenol  Assessment/Plan: 1. Suspected walled perforated cholecystitis w/ abscess:  on Imipenem Day 6, cultures with Ecoli but more than likely to be polymicrobial given likely GB perf, could transition to Cipro/Flagyl for 10-14days stop IVF S/p Ct guided drainage 4/13 with thick pus, 58F drain placed,  Also concerning for cholecystitis w/ abscess  Interval Cholecystectomy per [facility] Diet  Advanced to regular Hemoglobin drop likely from acute illness, no overt bleeding 2. Acute renal failure : resolved secondary to ATN, dehydration and being on ARB, metformin and diuretic.  3. DM: stable, restart metformin since kidney function normal, Novolog SSI AC and HS since she is eating  4. HTN: stable, could restart Telmisartan at DC 5. Hypokalemia: improved 6. Periop-cardiac risk: low to moderate, negative myoview in 2005. Asymptomatic and no acute EKG changes. Ambulate, PT eval DC Planning I will sign off, please call with questions or concerns   LOS: 5 days   [facility] Pager: [phone] 02/18/2012, 9:53 AM

## 2012-02-18 NOTE — Progress Notes (Signed)
Subjective : Patient undergoing doppler to r/o RLE DVT - found to be swollen this am. No other significant complaints.   Objective:  Vital signs in last 24 hours:  Temp: [97.2 F (36.2 C)-98.4 F (36.9 C)] 97.2 F (36.2 C) (04/16 0544)  Pulse Rate: [90-98] 90 (04/16 0544)  Resp: [18-20] 20 (04/16 0544)  BP: (100-118)/(63-73) 115/73 mmHg (04/16 0544)  SpO2: [99 %-100 %] 100 % (04/16 0544)  Weight change:  Last BM Date: 02/17/12  Physical examination : Doppler being performed.  Drain intact with yellow purulent drainage.  100 ml output recorded since yesterday.  E.coli on cultures.   me  02/18/12 0420  02/17/12 1125  02/17/12 0455   WBC  4.6  --  5.3   NEUTROABS  --  --  --   HGB  8.6*  10.1*  --   HCT  26.7*  31.0*  --   MCV  80.9  --  80.2   PLT  307  --  347    CULTURE, ROUTINE-ABSCESS Status: Normal    Collection Time    02/15/12 11:01 AM   Component  Value  Range  Status  Comment    Specimen Description  ABDOMEN RIGHT UPPER QUADRANT   Final     Special Requests  Normal   Final     Gram Stain    Final     Value:  MODERATE WBC PRESENT, PREDOMINANTLY PMN     NO SQUAMOUS EPITHELIAL CELLS SEEN     FEW GRAM NEGATIVE RODS    Culture  ABUNDANT ESCHERICHIA COLI   Final     Report Status  02/18/2012 FINAL   Final     Organism ID, Bacteria  ESCHERICHIA COLI   Final      Assessment/Plan:  Suspected walled perforated cholecystitis w/ abscess: on Imipenem Day 6, cultures with Ecoli but more than likely to be polymicrobial given likely bowel perf, could transition to Cipro/Flagyl for 10-14days per MD. S/p Ct guided drainage 4/13. To continue drain at this time - continue abx.  To follow up with imaging once output declines to </= 20 ml daily.  IR to continue to follow.

## 2012-02-18 NOTE — Progress Notes (Signed)
General Surgery [facility] - Attending  Patient seen and examined.  Scan of LE's negative for DVT.  Continued drainage from RUQ drain.  On IV abx.  Plan to give 48 hours more IV abx, then convert to oral Rx.  Possibly discharge home on Friday 4/19 with drain care.  Tentatively plan cholecystectomy in 4-6 weeks.  Velora Heckler, MD, [facility] Office: [phone]

## 2012-02-18 NOTE — Progress Notes (Signed)
Bilateral lower extremity venous duplex completed.  Preliminary report is negative for DVT, SVT, or a Baker's cyst. 

## 2012-02-18 NOTE — Evaluation (Signed)
Physical Therapy Evaluation Patient Details Name: Kelly Farmer MRN: [id number] DOB: Oct 20, 1941 Today's Date: 02/18/2012  Problem List:  Patient Active Problem List  Diagnoses  . Nausea & vomiting  . Hypokalemia  . Acute renal failure (ARF)  . Right upper quadrant abdominal abscess    Past Medical History:  Past Medical History  Diagnosis Date  . Hypercholesteremia   . Diabetes mellitus   . Hypertension   . Arthritis    Past Surgical History:  Past Surgical History  Procedure Date  . Abdominal hysterectomy   . Appendectomy   . Esophagogastroduodenoscopy 02/14/2012    Procedure: ESOPHAGOGASTRODUODENOSCOPY (EGD);  Surgeon: Graylin Shiver, MD;  Location: [facility];  Service: Endoscopy;  Laterality: N/A;    PT Assessment/Plan/Recommendation PT Assessment Clinical Impression Statement: Pt presents with diagnosis of right upper quadrant abdominal abscess. S/P EGD 4/12 and drain placed 4/13. Pt will benefit from skilled PT in acute setting to maximize independence and safety with basic functional mobility in preparation for d/c. PT Recommendation/Assessment: Patient will need skilled PT in the acute care venue PT Problem List: Decreased activity tolerance;Decreased mobility;Decreased knowledge of use of DME;Decreased balance PT Therapy Diagnosis : Difficulty walking;Generalized weakness PT Plan PT Frequency: Min 3X/week PT Treatment/Interventions: DME instruction;Gait training;Functional mobility training;Stair training;Therapeutic activities;Therapeutic exercise;Patient/family education PT Recommendation Recommendations for Other Services: OT consult Follow Up Recommendations: No PT follow up Equipment Recommended: None recommended by PT PT Goals  Acute Rehab PT Goals PT Goal Formulation: With patient Time For Goal Achievement: 2 weeks Pt will go Supine/Side to Sit: with modified independence PT Goal: Supine/Side to Sit - Progress: Goal set today Pt will go Sit to  Supine/Side: with modified independence PT Goal: Sit to Supine/Side - Progress: Goal set today Pt will go Sit to Stand: with modified independence PT Goal: Sit to Stand - Progress: Goal set today Pt will Ambulate: >150 feet;with modified independence;with least restrictive assistive device PT Goal: Ambulate - Progress: Goal set today Pt will Go Up / Down Stairs: 6-9 stairs;with rail(s) (6 steps) PT Goal: Up/Down Stairs - Progress: Goal set today  PT Evaluation Precautions/Restrictions  Precautions Precautions: Fall Precaution Comments: drain R quadrant Prior Functioning  Home Living Lives With: Son (friend) Type of Home: House Home Access: Stairs to enter Secretary/administrator of Steps: 3 Entrance Stairs-Rails: Right Home Layout: One level Home Adaptive Equipment: None Prior Function Level of Independence: Independent Cognition Cognition Arousal/Alertness: Awake/alert Overall Cognitive Status: Appears within functional limits for tasks assessed Orientation Level: Oriented X4 Sensation/Coordination Sensation Light Touch: Appears Intact Coordination Gross Motor Movements are Fluid and Coordinated: Yes Extremity Assessment RLE Assessment RLE Assessment: Within Functional Limits LLE Assessment LLE Assessment: Within Functional Limits Mobility (including Balance) Bed Mobility Bed Mobility: Yes Supine to Sit: HOB elevated (Comment degrees);With rails;4: Min assist Supine to Sit Details (indicate cue type and reason): Assist for trunk to upright. Heavy reliance on rails. VCs safety, technique, hand placement.  Sit to Supine: 5: Supervision Transfers Transfers: Yes Sit to Stand: From bed;With upper extremity assist Sit to Stand Details (indicate cue type and reason): Min-guard assist. VCs safety Stand to Sit: 5: Supervision;To bed;With upper extremity assist Stand to Sit Details: VCs safety. Ambulation/Gait Ambulation/Gait: Yes Ambulation/Gait Assistance Details  (indicate cue type and reason): Min-guard assist. Pt intermittently holding onto hallway handrail while walking. No LOB noted. Ambulation Distance (Feet): 160 Feet Assistive device: None (pushing iv pole) Gait Pattern: Step-through pattern  Posture/Postural Control Posture/Postural Control: No significant limitations Exercise    End of  Session PT - End of Session Equipment Utilized During Treatment: Gait belt Activity Tolerance: Patient tolerated treatment well Patient left: in bed;with call bell in reach General Behavior During Session: [facility] for tasks performed Cognition: [facility] for tasks performed  Rebeca Alert [facility] 02/18/2012, 2:57 PM [phone]

## 2012-02-18 NOTE — Progress Notes (Signed)
4 Days Post-Op  Subjective: Stomach is sore and she noted swelling RLE this AM.  Eating and drinking well.  Objective: Vital signs in last 24 hours: Temp:  [97.2 F (36.2 C)-98.4 F (36.9 C)] 97.2 F (36.2 C) (04/16 0544) Pulse Rate:  [90-98] 90  (04/16 0544) Resp:  [18-20] 20  (04/16 0544) BP: (100-118)/(63-73) 115/73 mmHg (04/16 0544) SpO2:  [99 %-100 %] 100 % (04/16 0544) Last BM Date: 02/17/12 Afebrile, VSS, Labs stable, 10 ml recorded from drain yesterday.  E coli from RUQ fluid Intake/Output from previous day: 04/15 0701 - 04/16 0700 In: 1422.7 [P.O.:840; I.V.:382.7; IV Piggyback:200] Out: 1810 [Urine:1800; Drains:10] Intake/Output this shift: Total I/O In: 110 [P.O.:100; Other:10] Out: -   General appearance: alert, cooperative and no distress Resp: clear to auscultation bilaterally GI: soft, non-tender; bowel sounds normal; no masses,  no organomegaly and drain is still clear white colored cloudy fluid. Extremities: RLE swollen and tender this AM.  Lab Results:   Basename 02/18/12 0420 02/17/12 1125 02/17/12 0455  WBC 4.6 -- 5.3  HGB 8.6* 10.1* --  HCT 26.7* 31.0* --  PLT 307 -- 347    BMET  Basename 02/18/12 0420 02/16/12 0518  NA 139 142  K 3.8 3.6  CL 108 108  CO2 22 28  GLUCOSE 99 127*  BUN 6 12  CREATININE 0.56 0.62  CALCIUM 8.0* 7.8*   PT/INR No results found for this basename: LABPROT:2,INR:2 in the last 72 hours   Lab 02/18/12 0420 02/14/12 0432 02/13/12 1415  AST 17 20 20   ALT 14 15 24   ALKPHOS 76 107 140*  BILITOT 0.2* 0.4 0.6  PROT 5.1* 6.4 8.1  ALBUMIN 1.9* 2.4* 3.0*     Lipase     Component Value Date/Time   LIPASE 76* 02/18/2012 0420     Studies/Results: No results found.  Medications:    . imipenem-cilastatin  500 mg Intravenous Q6H  . insulin aspart  0-9 Units Subcutaneous TID AC & HS  . metFORMIN  500 mg Oral Q breakfast    Assessment/Plan Nausea & vomiting  RUQ abscess, most likely gangrenous cholecystitis  based on sono appearance today. S/p EGD to r/o gastric obstruction, negative 4/12 DR. Ganem.  IR CT guided drainage of RUQ abscess 02/15/12 180 ml; culture pending abundant GM negative rods on culture.  C. Diff negative 4/13 Dyslipidemia  Diabetes Mellitis  Hypertension  H/H down 13.8/43 (4/14) this AM down to 8.8/27.2 (4/15)  Plan:  LE dopplers to R/O dvt, continue antibiotics.  H/H stable? Restart heparin for DVT prophylaxis, preliminary finding by tech suggest no DVT.     LOS: 5 days    Kelly Farmer 02/18/2012

## 2012-02-18 NOTE — Progress Notes (Signed)
ANTIBIOTIC CONSULT NOTE - Follow Up  Pharmacy Consult for Primaxin Indication: perforated cholecystitis  No Known Allergies  Patient Measurements: Height: 5\' 4"  (162.6 cm) Weight: 156 lb (70.761 kg) IBW/kg (Calculated) : 54.7   Vital Signs: Temp: 97.2 F (36.2 C) (04/16 0544) Temp src: Oral (04/16 0544) BP: 115/73 mmHg (04/16 0544) Pulse Rate: 90  (04/16 0544) Intake/Output from previous day: 04/15 0701 - 04/16 0700 In: 1422.7 [P.O.:840; I.V.:382.7; IV Piggyback:200] Out: 1810 [Urine:1800; Drains:10] Intake/Output from this shift: Total I/O In: 110 [P.O.:100; Other:10] Out: -   Labs:  Basename 02/18/12 0420 02/17/12 1125 02/17/12 0455 02/16/12 0518  WBC 4.6 -- 5.3 QUESTIONABLE RESULTS, RECOMMEND RECOLLECT TO VERIFY  HGB 8.6* 10.1* 8.8* --  PLT 307 -- 347 QUESTIONABLE RESULTS, RECOMMEND RECOLLECT TO VERIFY  LABCREA -- -- -- --  CREATININE 0.56 -- -- 0.62   Estimated Creatinine Clearance: 63.1 ml/min (by C-G formula based on Cr of 0.56).  32 ml/min (normalized)   Microbiology: Recent Results (from the past 720 hour(s))  URINE CULTURE     Status: Normal   Collection Time   02/13/12  3:06 PM      Component Value Range Status Comment   Specimen Description URINE, CLEAN CATCH   Final    Special Requests NONE   Final    Culture  Setup Time [id number]   Final    Colony Count 90,000 COLONIES/ML   Final    Culture YEAST   Final    Report Status 02/15/2012 FINAL   Final   CLOSTRIDIUM DIFFICILE BY PCR     Status: Normal   Collection Time   02/15/12 10:47 AM      Component Value Range Status Comment   C difficile by pcr NEGATIVE  NEGATIVE  Final   CULTURE, ROUTINE-ABSCESS     Status: Normal   Collection Time   02/15/12 11:01 AM      Component Value Range Status Comment   Specimen Description ABDOMEN RIGHT UPPER QUADRANT   Final    Special Requests Normal   Final    Gram Stain     Final    Value: MODERATE WBC PRESENT, PREDOMINANTLY PMN     NO SQUAMOUS EPITHELIAL CELLS  SEEN     FEW GRAM NEGATIVE RODS   Culture ABUNDANT ESCHERICHIA COLI   Final    Report Status 02/18/2012 FINAL   Final    Organism ID, Bacteria ESCHERICHIA COLI   Final     Medical History: Past Medical History  Diagnosis Date  . Hypercholesteremia   . Diabetes mellitus   . Hypertension   . Arthritis     Medications:  Scheduled:     . imipenem-cilastatin  500 mg Intravenous Q6H  . insulin aspart  0-9 Units Subcutaneous TID AC & HS  . metFORMIN  500 mg Oral Q breakfast   Infusions:     . DISCONTD: 0.9 % NaCl with KCl 40 mEq / L 10 mL/hr (02/17/12 1312)   Assessment:  71 yo female with perforated cholecystitis  Drain cx = pan sens Ecoli, however noted suspected polymicrobial infx given perforation  Day #6 Primaxin 500mg  q6h.  Scr remains wnl/stable  Goal of Therapy:  primaxin adjustment per renal function  Plan:  1. Continue Primaxin 500mg  q6h per protocol 2. Monitor renal function for potential dose changes  Gwen Her PharmD  [phone] 02/18/2012 12:38 PM

## 2012-02-19 LAB — GLUCOSE, CAPILLARY
Glucose-Capillary: 110 mg/dL — ABNORMAL HIGH (ref 70–99)
Glucose-Capillary: 129 mg/dL — ABNORMAL HIGH (ref 70–99)
Glucose-Capillary: 105 mg/dL — ABNORMAL HIGH (ref 70–99)
Glucose-Capillary: 147 mg/dL — ABNORMAL HIGH (ref 70–99)

## 2012-02-19 NOTE — Progress Notes (Signed)
Spoke with patient at bedside. States lives at home alone but has good family support. 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 She was independent prior to admission, feels good about returning home. States she has been ambulating in the halls, independent with ADL's while here. Family is able to assist with transportation needs. She has f/u care with Dr. Azucena Cecil her PCP. Per order will need [facility] RN for drain care. Patient chose [facility] for [facility] services, contacted Darl Pikes with [facility] to arrange. Anticipate d/c on 02-21-12.

## 2012-02-19 NOTE — Progress Notes (Signed)
5 Days Post-Op  Subjective: Her only complaint is over the drain site, otherwise she's feeling pretty good.  Objective: Vital signs in last 24 hours: Temp:  [98.7 F (37.1 C)-98.9 F (37.2 C)] 98.7 F (37.1 C) (04/17 0607) Pulse Rate:  [94-105] 105  (04/17 0607) Resp:  [18-20] 18  (04/17 0607) BP: (119-121)/(55-66) 119/55 mmHg (04/17 0607) SpO2:  [97 %-100 %] 97 % (04/17 0607) Last BM Date: 02/17/12 13 ml thru drain, currently unable to to aspirate fluid from drain, no cytology back on drain fluid at this point. Afebrile, VSS, no labs, creatinine is back to normal Intake/Output from previous day: 04/16 0701 - 04/17 0700 In: 210 [P.O.:100; IV Piggyback:100] Out: 513 [Urine:500; Drains:13] Intake/Output this shift: Total I/O In: 360 [P.O.:360] Out: -   General appearance: alert, cooperative and no distress GI: soft, non-tender; bowel sounds normal; no masses,  no organomegaly Whats in drain now looks purulent.  Lab Results:   Basename 02/18/12 0420 02/17/12 1125 02/17/12 0455  WBC 4.6 -- 5.3  HGB 8.6* 10.1* --  HCT 26.7* 31.0* --  PLT 307 -- 347    BMET  Basename 02/18/12 0420  NA 139  K 3.8  CL 108  CO2 22  GLUCOSE 99  BUN 6  CREATININE 0.56  CALCIUM 8.0*   PT/INR No results found for this basename: LABPROT:2,INR:2 in the last 72 hours   Lab 02/18/12 0420 02/14/12 0432 02/13/12 1415  AST 17 20 20   ALT 14 15 24   ALKPHOS 76 107 140*  BILITOT 0.2* 0.4 0.6  PROT 5.1* 6.4 8.1  ALBUMIN 1.9* 2.4* 3.0*     Lipase     Component Value Date/Time   LIPASE 76* 02/18/2012 0420     Studies/Results: No results found.  Medications:    . enoxaparin (LOVENOX) injection  40 mg Subcutaneous Q24H  . imipenem-cilastatin  500 mg Intravenous Q6H  . insulin aspart  0-9 Units Subcutaneous TID AC & HS  . metFORMIN  500 mg Oral Q breakfast    Assessment/Plan Nausea & vomiting  RUQ abscess, most likely gangrenous cholecystitis based on sono appearance today. S/p EGD  to r/o gastric obstruction, negative 4/12 DR. Ganem.  IR CT guided drainage of RUQ abscess 02/15/12 180 ml; culture E coli, sensitive to imipenem which she is on. C. Diff negative 4/13 Dyslipidemia  Diabetes Mellitis  Hypertension  H/H down 13.8/43 (4/14) this AM down to 8.8/27.2 (4/15)    Plan: Possible repeat CT/drain injection before d/c. Radiology was unable to aspirate anything today. rechek labs tomorrow    LOS: 6 days    Kelly Farmer 02/19/2012

## 2012-02-19 NOTE — Progress Notes (Signed)
General Surgery [facility] - Attending  Patient seen and examined.  Plan to repeat CT abd tomorrow with possible drain injection.  If making progress, will consider converting to po abx and discharge home 4/19.  Discussed with patient and she agrees to proceed with scan in AM.  Velora Heckler, MD, [facility] Office: [phone]

## 2012-02-19 NOTE — Progress Notes (Addendum)
5 Days Post-Op  Subjective: Patient without new c/o; ambulating in hallway now; LE dopplers neg for DVT  Objective: Vital signs in last 24 hours: Temp:  [98.7 F (37.1 C)-98.9 F (37.2 C)] 98.7 F (37.1 C) (04/17 0607) Pulse Rate:  [94-105] 105  (04/17 0607) Resp:  [18-20] 18  (04/17 0607) BP: (119-121)/(55-66) 119/55 mmHg (04/17 0607) SpO2:  [97 %-100 %] 97 % (04/17 0607) Last BM Date: 02/17/12  Intake/Output from previous day: 04/16 0701 - 04/17 0700 In: 210 [P.O.:100; IV Piggyback:100] Out: 513 [Urine:500; Drains:13] Intake/Output this shift: Total I/O In: 360 [P.O.:360] Out: -   RUQ drain intact, output about 15 cc's today milky white fluid, cx's - e.coli pan sensitive ; insertion site mildly tender to palpation; drain flushed with 5cc's sterile NS with no return of fluid  Lab Results:   Basename 02/18/12 0420 02/17/12 1125 02/17/12 0455  WBC 4.6 -- 5.3  HGB 8.6* 10.1* --  HCT 26.7* 31.0* --  PLT 307 -- 347   BMET  Basename 02/18/12 0420  NA 139  K 3.8  CL 108  CO2 22  GLUCOSE 99  BUN 6  CREATININE 0.56  CALCIUM 8.0*   PT/INR No results found for this basename: LABPROT:2,INR:2 in the last 72 hours ABG No results found for this basename: PHART:2,PCO2:2,PO2:2,HCO3:2 in the last 72 hours  Studies/Results: No results found. Results for orders placed during the hospital encounter of 02/13/12  URINE CULTURE     Status: Normal   Collection Time   02/13/12  3:06 PM      Component Value Range Status Comment   Specimen Description URINE, CLEAN CATCH   Final    Special Requests NONE   Final    Culture  Setup Time [id number]   Final    Colony Count 90,000 COLONIES/ML   Final    Culture YEAST   Final    Report Status 02/15/2012 FINAL   Final   CLOSTRIDIUM DIFFICILE BY PCR     Status: Normal   Collection Time   02/15/12 10:47 AM      Component Value Range Status Comment   C difficile by pcr NEGATIVE  NEGATIVE  Final   CULTURE, ROUTINE-ABSCESS     Status:  Normal   Collection Time   02/15/12 11:01 AM      Component Value Range Status Comment   Specimen Description ABDOMEN RIGHT UPPER QUADRANT   Final    Special Requests Normal   Final    Gram Stain     Final    Value: MODERATE WBC PRESENT, PREDOMINANTLY PMN     NO SQUAMOUS EPITHELIAL CELLS SEEN     FEW GRAM NEGATIVE RODS   Culture ABUNDANT ESCHERICHIA COLI   Final    Report Status 02/18/2012 FINAL   Final    Organism ID, Bacteria ESCHERICHIA COLI   Final    Anti-infectives: Anti-infectives     Start     Dose/Rate Route Frequency Ordered Stop   02/15/12 1600   imipenem-cilastatin (PRIMAXIN) 500 mg in sodium chloride 0.9 % 100 mL IVPB        500 mg 200 mL/hr over 30 Minutes Intravenous Every 6 hours 02/15/12 1236     02/14/12 1600   imipenem-cilastatin (PRIMAXIN) 250 mg in sodium chloride 0.9 % 100 mL IVPB  Status:  Discontinued        250 mg 200 mL/hr over 30 Minutes Intravenous Every 6 hours 02/14/12 1037 02/15/12 1236   02/13/12 2200  imipenem-cilastatin (PRIMAXIN) 250 mg in sodium chloride 0.9 % 100 mL IVPB  Status:  Discontinued        250 mg 200 mL/hr over 30 Minutes Intravenous Every 12 hours 02/13/12 1756 02/14/12 1036   02/13/12 1715   ertapenem (INVANZ) 1 g in sodium chloride 0.9 % 50 mL IVPB        1 g 100 mL/hr over 30 Minutes Intravenous  Once 02/13/12 1711 02/13/12 1756          Assessment/Plan: s/p perc cholecystostomy 4/13 secondary to perf cholecystitis with assoc abscess ? bowel involvement; recommend f/u CT A/P/possible drain injection before pt d/c'd home (and especially due to inability to aspirate fluid from drain) to check placement/ adequacy of drainage /evaluate bowel near region of drain; check cytology on drain fluid.    LOS: 6 days    Meribeth Vitug,D [facility] 02/19/2012

## 2012-02-20 ENCOUNTER — Inpatient Hospital Stay (HOSPITAL_COMMUNITY): Payer: Medicare Other

## 2012-02-20 LAB — COMPREHENSIVE METABOLIC PANEL
ALT: 18 U/L (ref 0–35)
AST: 20 U/L (ref 0–37)
Albumin: 2.1 g/dL — ABNORMAL LOW (ref 3.5–5.2)
Alkaline Phosphatase: 75 U/L (ref 39–117)
BUN: 6 mg/dL (ref 6–23)
CO2: 24 mEq/L (ref 19–32)
Calcium: 8 mg/dL — ABNORMAL LOW (ref 8.4–10.5)
Chloride: 109 mEq/L (ref 96–112)
Creatinine, Ser: 0.53 mg/dL (ref 0.50–1.10)
GFR calc Af Amer: >90 mL/min (ref >90)
GFR calc non Af Amer: >90 mL/min (ref >90)
Glucose, Bld: 110 mg/dL — ABNORMAL HIGH (ref 70–99)
Potassium: 3.3 mEq/L — ABNORMAL LOW (ref 3.5–5.1)
Sodium: 140 mEq/L (ref 135–145)
Total Bilirubin: 0.2 mg/dL — ABNORMAL LOW (ref 0.3–1.2)
Total Protein: 5.3 g/dL — ABNORMAL LOW (ref 6.0–8.3)

## 2012-02-20 LAB — GLUCOSE, CAPILLARY
Glucose-Capillary: 116 mg/dL — ABNORMAL HIGH (ref 70–99)
Glucose-Capillary: 96 mg/dL (ref 70–99)
Glucose-Capillary: 167 mg/dL — ABNORMAL HIGH (ref 70–99)
Glucose-Capillary: 127 mg/dL — ABNORMAL HIGH (ref 70–99)

## 2012-02-20 LAB — CBC
HCT: 26.2 % — ABNORMAL LOW (ref 36.0–46.0)
Hemoglobin: 8.6 g/dL — ABNORMAL LOW (ref 12.0–15.0)
MCH: 26.1 pg (ref 26.0–34.0)
MCHC: 32.8 g/dL (ref 30.0–36.0)
MCV: 79.4 fL (ref 78.0–100.0)
Platelets: 286 10*3/uL (ref 150–400)
RBC: 3.3 MIL/uL — ABNORMAL LOW (ref 3.87–5.11)
RDW: 16.5 % — ABNORMAL HIGH (ref 11.5–15.5)
WBC: 6.7 10*3/uL (ref 4.0–10.5)

## 2012-02-20 MED ORDER — IOHEXOL 300 MG/ML  SOLN
100.0000 mL | Freq: Once | INTRAMUSCULAR | Status: AC | PRN
  Administered 2012-02-20: 100 mL via INTRAVENOUS

## 2012-02-20 MED ORDER — AMOXICILLIN-POT CLAVULANATE 875-125 MG PO TABS
1.0000 | ORAL_TABLET | Freq: Two times a day (BID) | ORAL | Status: DC
  Administered 2012-02-20 – 2012-02-21 (×2): 1 via ORAL
  Filled 2012-02-20 (×3): qty 1

## 2012-02-20 MED ORDER — IOHEXOL 300 MG/ML  SOLN: 11.0000 mL | Freq: Once | INTRAMUSCULAR | Status: AC | PRN

## 2012-02-20 NOTE — Procedures (Signed)
Injection of cholecystostomy confirmed placement in gallbladder.  There is a fistula connection to the colon which corresponds with the small pericolonic abscess.  No filling of cystic duct.

## 2012-02-20 NOTE — Discharge Instructions (Signed)
Cholecystitis   Cholecystitis is swelling and irritation (inflammation) of your gallbladder. This often happens when gallstones or sludge build up in the gallbladder. Treatment is needed right away.  HOME CARE  Home care depends on how you were treated. In general:   If you were given antibiotic medicine, take it as told. Finish the medicine even if you start to feel better.   Only take medicines as told by your doctor.   Eat low-fat foods until your next doctor visit.   Keep all doctor visits as told.  GET HELP RIGHT AWAY IF:   You have more pain and medicine does not help.   Your pain moves to a different part of your belly (abdomen) or to your back.   You have a fever.   You feel sick to your stomach (nauseous).   You throw up (vomit).  MAKE SURE YOU:   Understand these instructions.   Will watch your condition.   Will get help right away if you are not doing well or get worse.  Document Released: 10/10/2011 Document Reviewed: 10/08/2011  [employer] Patient Information 2012 [employer].

## 2012-02-20 NOTE — Progress Notes (Signed)
General Surgery [facility] - Attending  Patient seen and examined.  Discussed results of drain study today.  Interesting case with probable abscess cavity and fistulous connection to both proximal transverse colon and gallbladder.  Etiology?  Now controlled with drain and abx Rx.  Discussed need for surgery to remove gallbladder and segment of colon.  Would tentatively plan surgical resection in 4-6 weeks.  Will convert to oral antibiotics and plan to leave drain in place until time of surgery.  Probable discharge home tomorrow with [facility] for drain care.  Patient and family understand and are in agreement.  Velora Heckler, MD, [facility] Office: [phone]

## 2012-02-20 NOTE — Discharge Summary (Signed)
Physician Discharge Summary  Patient ID: Kelly Farmer MRN: [id number] DOB/AGE: 71-10-1941 71 y.o.  Admit date: 02/13/2012 Discharge date: 02/21/2012  Admission Diagnoses: RUQ abscess uncertain etiology  Discharge Diagnoses: RUQ abscess, most likely gangrenous cholecystitis with fistula to the colon with abscess. S/p EGD to r/o gastric obstruction, negative 4/12 DR. Ganem.  IR CT guided drainage of RUQ abscess 02/15/12 180 ml; culture E coli, sensitive to imipenem which she is on.  C. Diff negative 4/13 Dyslipidemia  Diabetes Mellitis  Hypertension  H/H down 13.8/43 (4/14) this AM down to 8.8/27.2 (4/15)  Principal Problem:  *Right upper quadrant abdominal abscess Active Problems:  Suppurative cholecystitis  Colonic Fistula  Nausea & vomiting  Hypokalemia  Diabetes mellitus type 2, noninsulin dependent  Hypertension  Acute renal failure (ARF)  Dyslipidemia   PROCEDURES: 1. EGD to r/o gastric obstruction, negative 4/12 DR. Ganem.  2.IR CT guided drainage of RUQ abscess 02/15/12  3.  Injection of cholecystostomy confirmed placement in gallbladder. There is a fistula connection to the colon which corresponds with the small pericolonic abscess. No filling of cystic duct. DR. Lowella Dandy (IR)   Hospital Course:  patient is 71 yo BF with 3 week hx of nausea, emesis, and inability to tolerate a diet. Seen by primary MD and US abdomen obtained showing thickening of GB wall, o/w negative for acute findings. Persistent complaints of N/V and diarrhea. No fever, no chills, no hematemesis, no BRBPR. Denies abdominal pain. Last colonoscopy by Dr. Bosie Clos about 5 years ago.  CT scan today showing inflammatory versus neoplastic process in RUQ involving prox duodenum, colon, and gallbladder.  WBC minimally elevated, diff normal, LFT's normal. Pt was admitted and seen By Dr. Evette Cristal who performed an endoscopy to evaluate her recurrent nausea and vomiting. He found extrinsic compression of the antrum and  pyloric region. There were no ulcers or tumors. At that point Dr. Sharlet Salina IR drainage of the abscess. This was done on 02/15/12 by DR. Henn.  180 ml of thick yellow purulent material was drained.  It grew E. Coli.  She was maintained on antibiotics and showed very good improvement. Her pain resolved as did her nausea and vomiting. On 4/18 she had a CT scan and injection of the drain placed by IR.  This shows dye going into the GB, and the Right colon.  We don't know which was the origin of her problem.  CT scan shows resolving acute cholecystitis, and ongoing collection between, GB fundus and hepatic flexure of the colon.   Pt has been switched over to Oral antibiotics.  If she does well we will discharge her home on Augmentin till she has surgery.  Plan to follow up Dr. Gerrit Friends 3 weeks with plans to do cholecystectomy, and partial colon resection 4-6 weeks.  Follow up 3 weeks: DR. Gerrit Friends   Disposition: Final discharge disposition not confirmed   Medication List  As of 02/21/2012 11:00 AM   TAKE these medications         acetaminophen 325 MG tablet   Commonly known as: TYLENOL   Take 2 tablets (650 mg total) by mouth every 6 (six) hours as needed for pain.      amoxicillin-clavulanate 875-125 MG per tablet   Commonly known as: AUGMENTIN   Take 1 tablet by mouth every 12 (twelve) hours.      aspirin 325 MG tablet   Take 325 mg by mouth daily.      ezetimibe 10 MG tablet   Commonly known  as: ZETIA   Take 10 mg by mouth daily.      HYDROcodone-acetaminophen 5-325 MG per tablet   Commonly known as: NORCO   Take 1-2 tablets by mouth every 4 (four) hours as needed (As needed for moderate pain).      metFORMIN 1000 MG tablet   Commonly known as: GLUCOPHAGE   Take 1,000 mg by mouth 2 (two) times daily with a meal.      mulitivitamin with minerals Tabs   Take 1 tablet by mouth daily.      rosuvastatin 20 MG tablet   Commonly known as: CRESTOR   Take 20 mg by mouth daily.       telmisartan-hydrochlorothiazide 80-12.5 MG per tablet   Commonly known as: MICARDIS HCT   Take 1 tablet by mouth daily.           Follow-up Information    Follow up with [facility]. (RN for drain care)    Contact information:   [phone]      Follow up with Sissy Hoff, MD.   Contact information:   [address] [place] [address] [phone]       Schedule an appointment as soon as possible for a visit with Sissy Hoff, MD.   Contact information:   [address] [place] [address] [phone]       Follow up with Velora Heckler, MD. Schedule an appointment as soon as possible for a visit in 3 weeks.   Contact information:   [employer] [address] [place] [address] [phone]       Follow up with Drain care: Marland Kitchen (Home health will come out to help you care for the the drain.)          Signed: Tahjae Durr 02/21/2012, 11:00 AM

## 2012-02-20 NOTE — Progress Notes (Signed)
6 Days Post-Op  Subjective: She feels good, showed me her new pocketbook (drainage bag) and is hoping  She can go home tomorrow.  Objective: Vital signs in last 24 hours: Temp:  [98.4 F (36.9 C)-99 F (37.2 C)] 99 F (37.2 C) (04/18 0542) Pulse Rate:  [94-104] 97  (04/18 0542) Resp:  [18-20] 18  (04/18 0542) BP: (118-133)/(59-64) 133/59 mmHg (04/18 0542) SpO2:  [98 %-100 %] 100 % (04/18 0542) Last BM Date:  (02/20/2012) 5 Ml thru drain, Injection shows drain is in gallbladder with a fistula to  The right colon. TM 99, labs stable. Intake/Output from previous day: 04/17 0701 - 04/18 0700 In: 840 [P.O.:840] Out: 1405 [Urine:1400; Drains:5] Intake/Output this shift: Total I/O In: -  Out: 150 [Urine:150]  General appearance: alert, cooperative and no distress Resp: clear to auscultation bilaterally GI: soft, non-tender; bowel sounds normal; no masses,  no organomegaly and not very tender even over drain, nothing in drainage bag.  Lab Results:   [facility] 02/20/12 0426 02/18/12 0420  WBC 6.7 4.6  HGB 8.6* 8.6*  HCT 26.2* 26.7*  PLT 286 307    BMET  Basename 02/20/12 0426 02/18/12 0420  NA 140 139  K 3.3* 3.8  CL 109 108  CO2 24 22  GLUCOSE 110* 99  BUN 6 6  CREATININE 0.53 0.56  CALCIUM 8.0* 8.0*   PT/INR No results found for this basename: LABPROT:2,INR:2 in the last 72 hours   Lab 02/20/12 0426 02/18/12 0420 02/14/12 0432 02/13/12 1415  AST 20 17 20 20   ALT 18 14 15 24   ALKPHOS 75 76 107 140*  BILITOT 0.2* 0.2* 0.4 0.6  PROT 5.3* 5.1* 6.4 8.1  ALBUMIN 2.1* 1.9* 2.4* 3.0*     Lipase     Component Value Date/Time   LIPASE 76* 02/18/2012 0420     Studies/Results: Ir Sinus/fist Tube Chk-non Gi  02/20/2012  *RADIOLOGY REPORT*  Clinical history:Cholecystostomy tube placement and decreased drainage from tube.  PROCEDURE(S): INJECTION OF CHOLECYSTOSTOMY TUBE UNDER FLUOROSCOPY  Physician: Rachelle Hora. Henn, MD  Medications:None  Moderate sedation time:None   Fluoroscopy time: 0.6 minutes  Contrast:  11 ml Omnipaque-300  Procedure:The cholecystostomy tube was injected under fluoroscopy. The tube was aspirated and flushed with normal saline at the end of the procedure.  Findings:Pigtail catheter is positioned in the right upper quadrant.  Injection of contrast demonstrates filling of the gallbladder.  The gallbladder fundus is very irregular and there is caudal extravasation.  Based on the recent CT, the contrast appears to be draining into the adjacent colon.  There is no filling of the cystic duct.  Complications: None  Impression:Drainage catheter is positioned in the gallbladder. There is marked irregularity of the gallbladder fundus and findings are suggestive for a gallbladder perforation.  There is a fistula connection to the right colon.  No filling of the cystic duct.  Original Report Authenticated By: Richarda Overlie, M.D.    Medications:    . enoxaparin (LOVENOX) injection  40 mg Subcutaneous Q24H  . imipenem-cilastatin  500 mg Intravenous Q6H  . insulin aspart  0-9 Units Subcutaneous TID AC & HS  . metFORMIN  500 mg Oral Q breakfast    Assessment/Plan Injection of Cholecystostomy in gallbladder with fistula connection to the colon which corresponds to the small pericolonic abscess. RUQ abscess, most likely gangrenous cholecystitis based on sono appearance today. S/p EGD to r/o gastric obstruction, negative 4/12 DR. Ganem.  IR CT guided drainage of RUQ abscess 02/15/12 180  ml; culture E coli, sensitive to imipenem which she is on.  C. Diff negative 4/13 Dyslipidemia  Diabetes Mellitis  Hypertension  H/H down 13.8/43 (4/14) this AM down to 8.8/27.2 (4/15)   Will review CT and drain injection with DR. Gerkin   LOS: 7 days    Kelly Farmer 02/20/2012

## 2012-02-21 DIAGNOSIS — I10 Essential (primary) hypertension: Secondary | ICD-10-CM | POA: Diagnosis present

## 2012-02-21 DIAGNOSIS — E785 Hyperlipidemia, unspecified: Secondary | ICD-10-CM | POA: Diagnosis present

## 2012-02-21 DIAGNOSIS — E119 Type 2 diabetes mellitus without complications: Secondary | ICD-10-CM | POA: Diagnosis present

## 2012-02-21 DIAGNOSIS — K810 Acute cholecystitis: Secondary | ICD-10-CM | POA: Diagnosis present

## 2012-02-21 LAB — GLUCOSE, CAPILLARY
Glucose-Capillary: 142 mg/dL — ABNORMAL HIGH (ref 70–99)
Glucose-Capillary: 117 mg/dL — ABNORMAL HIGH (ref 70–99)

## 2012-02-21 MED ORDER — AMOXICILLIN-POT CLAVULANATE 875-125 MG PO TABS: 1.0000 | ORAL_TABLET | Freq: Two times a day (BID) | ORAL | Status: AC

## 2012-02-21 MED ORDER — ACETAMINOPHEN 325 MG PO TABS: 650.0000 mg | ORAL_TABLET | Freq: Four times a day (QID) | ORAL | Status: AC | PRN

## 2012-02-21 MED ORDER — AMOXICILLIN-POT CLAVULANATE 875-125 MG PO TABS: 1.0000 | ORAL_TABLET | Freq: Two times a day (BID) | ORAL | Status: DC

## 2012-02-21 MED ORDER — HYDROCODONE-ACETAMINOPHEN 5-325 MG PO TABS: 1.0000 | ORAL_TABLET | ORAL | Status: AC | PRN

## 2012-02-21 NOTE — Progress Notes (Signed)
7 Days Post-Op  Subjective: Pt ok. Feeling well this am. Ready to go home.  Objective: Vital signs in last 24 hours: Temp:  [98.1 F (36.7 C)-98.9 F (37.2 C)] 98.8 F (37.1 C) (04/19 0600) Pulse Rate:  [92-100] 93  (04/19 0600) Resp:  [18-20] 20  (04/19 0600) BP: (117-137)/(68-82) 117/68 mmHg (04/19 0600) SpO2:  [98 %-100 %] 100 % (04/19 0600) Last BM Date: 02/21/12  Intake/Output this shift:    Physical Exam: BP 117/68  Pulse 93  Temp(Src) 98.8 F (37.1 C) (Oral)  Resp 20  Ht 5\' 4"  (1.626 m)  Wt 156 lb (70.761 kg)  BMI 26.78 kg/m2  SpO2 100% Drain site clean, intact. Creamy purulent output.  Labs: CBC  Basename 02/20/12 0426  WBC 6.7  HGB 8.6*  HCT 26.2*  PLT 286   BMET  Basename 02/20/12 0426  NA 140  K 3.3*  CL 109  CO2 24  GLUCOSE 110*  BUN 6  CREATININE 0.53  CALCIUM 8.0*   LFT  Basename 02/20/12 0426  PROT 5.3*  ALBUMIN 2.1*  AST 20  ALT 18  ALKPHOS 75  BILITOT 0.2*  BILIDIR --  IBILI --  LIPASE --   PT/INR No results found for this basename: LABPROT:2,INR:2 in the last 72 hours ABG No results found for this basename: PHART:2,PCO2:2,PO2:2,HCO3:2 in the last 72 hours  Studies/Results: Ct Abdomen Pelvis W Contrast  02/20/2012  *RADIOLOGY REPORT*  Clinical Data: Acute cholecystitis.  Leukocytosis. Status post percutaneous cholecystostomy.  CT ABDOMEN AND PELVIS WITH CONTRAST  Technique:  Multidetector CT imaging of the abdomen and pelvis was performed following the standard protocol during bolus administration of intravenous contrast.  Contrast: OMNIPAQUE IOHEXOL 300 MG/ML  SOLN  Comparison: 02/13/2012  Findings: There has been interval placement of a percutaneous cholecystostomy tube which is seen in appropriate position in the gallbladder lumen.  Resolution of gallbladder dilatation is seen since previous study.  There is significant decrease in peripancreatic inflammatory changes and possible fluid since prior exam. A residual rim  enhancing fluid collection is seen between the fundus of gallbladder and the hepatic flexure the colon, which measures 1.3 x 1.7 cm on image 31.  This is consistent with a tiny residual pericholecystic abscess.  No evidence of biliary ductal dilatation.  No abscess or free fluid seen elsewhere within the abdomen or pelvis.  Prior hysterectomy noted.  Diverticulosis is again demonstrated, however there is no evidence of diverticulitis.  No other inflammatory process or abnormal fluid collections are identified.  The liver, pancreas, spleen, adrenal glands, and kidneys are normal in appearance.  No soft tissue masses or lymphadenopathy identified.  No evidence of dilated bowel loops.  New tiny bilateral pleural effusions are noted as well as increased right basilar atelectasis.  IMPRESSION:  1.  Percutaneous cholecystostomy tube in appropriate position, with resolving changes of acute cholecystitis since prior exam. 2.  Residual 1.7 cm rim enhancing fluid collection between the gallbladder fundus and hepatic flexure of colon, consistent with a tiny abscess 3.  Diverticulosis.  No radiographic evidence of diverticulitis. 4.  New small bilateral pleural effusions and increased right basilar atelectasis.  Original Report Authenticated By: Danae Orleans, M.D.   Ir Sinus/fist Tube Chk-non Gi  02/20/2012  *RADIOLOGY REPORT*  Clinical history:Cholecystostomy tube placement and decreased drainage from tube.  PROCEDURE(S): INJECTION OF CHOLECYSTOSTOMY TUBE UNDER FLUOROSCOPY  Physician: Rachelle Hora. Henn, MD  Medications:None  Moderate sedation time:None  Fluoroscopy time: 0.6 minutes  Contrast:  11 ml  Omnipaque-300  Procedure:The cholecystostomy tube was injected under fluoroscopy. The tube was aspirated and flushed with normal saline at the end of the procedure.  Findings:Pigtail catheter is positioned in the right upper quadrant.  Injection of contrast demonstrates filling of the gallbladder.  The gallbladder fundus is very  irregular and there is caudal extravasation.  Based on the recent CT, the contrast appears to be draining into the adjacent colon.  There is no filling of the cystic duct.  Complications: None  Impression:Drainage catheter is positioned in the gallbladder. There is marked irregularity of the gallbladder fundus and findings are suggestive for a gallbladder perforation.  There is a fistula connection to the right colon.  No filling of the cystic duct.  Original Report Authenticated By: Richarda Overlie, M.D.    Assessment/Plan: Cholecystitis with perforation and fistula to colon S/p perc drain placement. Per surgery, probable DC home today with tentative plans for surgery in 4-6weeks with drain to remain until time of surgery. Please contact us if further assistance needed.    LOS: 8 days    Brayton El PA-C 02/21/2012 9:17 AM

## 2012-02-21 NOTE — Progress Notes (Signed)
Notified [facility] of pending d/c today, updated orders noted.

## 2012-02-21 NOTE — Progress Notes (Signed)
7 Days Post-Op  Subjective: She looks and feels good.  Drainage in bag is cloudy white colored today. She wants to go home.  Objective: Vital signs in last 24 hours: Temp:  [98.1 F (36.7 C)-98.9 F (37.2 C)] 98.8 F (37.1 C) (04/19 0600) Pulse Rate:  [92-100] 93  (04/19 0600) Resp:  [18-20] 20  (04/19 0600) BP: (117-137)/(68-82) 117/68 mmHg (04/19 0600) SpO2:  [98 %-100 %] 100 % (04/19 0600) Last BM Date: 02/21/12  Nothing from drain yesterday, Afebrile, VSS,   Intake/Output from previous day: 04/18 0701 - 04/19 0700 In: -  Out: 1650 [Urine:1650] Intake/Output this shift:    General appearance: alert, cooperative and no distress Chest wall: lungs clear GI: soft, non-tender; bowel sounds normal; no masses,  no organomegaly and Drainage today is purulent white cloudy today.  Lab Results:   [facility] 02/20/12 0426  WBC 6.7  HGB 8.6*  HCT 26.2*  PLT 286    BMET  Basename 02/20/12 0426  NA 140  K 3.3*  CL 109  CO2 24  GLUCOSE 110*  BUN 6  CREATININE 0.53  CALCIUM 8.0*   PT/INR No results found for this basename: LABPROT:2,INR:2 in the last 72 hours   Lab 02/20/12 0426 02/18/12 0420  AST 20 17  ALT 18 14  ALKPHOS 75 76  BILITOT 0.2* 0.2*  PROT 5.3* 5.1*  ALBUMIN 2.1* 1.9*     Lipase     Component Value Date/Time   LIPASE 76* 02/18/2012 0420     Studies/Results: Ct Abdomen Pelvis W Contrast  02/20/2012  *RADIOLOGY REPORT*  Clinical Data: Acute cholecystitis.  Leukocytosis. Status post percutaneous cholecystostomy.  CT ABDOMEN AND PELVIS WITH CONTRAST  Technique:  Multidetector CT imaging of the abdomen and pelvis was performed following the standard protocol during bolus administration of intravenous contrast.  Contrast: OMNIPAQUE IOHEXOL 300 MG/ML  SOLN  Comparison: 02/13/2012  Findings: There has been interval placement of a percutaneous cholecystostomy tube which is seen in appropriate position in the gallbladder lumen.  Resolution of  gallbladder dilatation is seen since previous study.  There is significant decrease in peripancreatic inflammatory changes and possible fluid since prior exam. A residual rim enhancing fluid collection is seen between the fundus of gallbladder and the hepatic flexure the colon, which measures 1.3 x 1.7 cm on image 31.  This is consistent with a tiny residual pericholecystic abscess.  No evidence of biliary ductal dilatation.  No abscess or free fluid seen elsewhere within the abdomen or pelvis.  Prior hysterectomy noted.  Diverticulosis is again demonstrated, however there is no evidence of diverticulitis.  No other inflammatory process or abnormal fluid collections are identified.  The liver, pancreas, spleen, adrenal glands, and kidneys are normal in appearance.  No soft tissue masses or lymphadenopathy identified.  No evidence of dilated bowel loops.  New tiny bilateral pleural effusions are noted as well as increased right basilar atelectasis.  IMPRESSION:  1.  Percutaneous cholecystostomy tube in appropriate position, with resolving changes of acute cholecystitis since prior exam. 2.  Residual 1.7 cm rim enhancing fluid collection between the gallbladder fundus and hepatic flexure of colon, consistent with a tiny abscess 3.  Diverticulosis.  No radiographic evidence of diverticulitis. 4.  New small bilateral pleural effusions and increased right basilar atelectasis.  Original Report Authenticated By: Danae Orleans, M.D.   Ir Sinus/fist Tube Chk-non Gi  02/20/2012  *RADIOLOGY REPORT*  Clinical history:Cholecystostomy tube placement and decreased drainage from tube.  PROCEDURE(S): INJECTION OF  CHOLECYSTOSTOMY TUBE UNDER FLUOROSCOPY  Physician: Rachelle Hora. Henn, MD  Medications:None  Moderate sedation time:None  Fluoroscopy time: 0.6 minutes  Contrast:  11 ml Omnipaque-300  Procedure:The cholecystostomy tube was injected under fluoroscopy. The tube was aspirated and flushed with normal saline at the end of the  procedure.  Findings:Pigtail catheter is positioned in the right upper quadrant.  Injection of contrast demonstrates filling of the gallbladder.  The gallbladder fundus is very irregular and there is caudal extravasation.  Based on the recent CT, the contrast appears to be draining into the adjacent colon.  There is no filling of the cystic duct.  Complications: None  Impression:Drainage catheter is positioned in the gallbladder. There is marked irregularity of the gallbladder fundus and findings are suggestive for a gallbladder perforation.  There is a fistula connection to the right colon.  No filling of the cystic duct.  Original Report Authenticated By: Richarda Overlie, M.D.    Medications:    . amoxicillin-clavulanate  1 tablet Oral Q12H  . enoxaparin (LOVENOX) injection  40 mg Subcutaneous Q24H  . insulin aspart  0-9 Units Subcutaneous TID AC & HS  . metFORMIN  500 mg Oral Q breakfast  . DISCONTD: imipenem-cilastatin  500 mg Intravenous Q6H    Assessment/Plan Injection of Cholecystostomy in gallbladder with fistula connection to the colon which corresponds to the small pericolonic abscess.  RUQ abscess, most likely gangrenous cholecystitis based on sono appearance today. S/p EGD to r/o gastric obstruction, negative 4/12 DR. Ganem.  IR CT guided drainage of RUQ abscess 02/15/12 180 ml; culture E coli, sensitive to imipenem which she is on.  C. Diff negative 4/13 Dyslipidemia  Diabetes Mellitis  Hypertension  H/H down 13.8/43 (4/14) this AM down to 8.8/27.2 (4/15)   Plan:  No fever, she feels good. Hope to send her home today.  I will let DR. Gerkin know about change in drainage color. I talked with radiology and they do not want flushes of drain at home.  They are concerned it would make things worse. Discussed and plan to send home today.  LOS: 8 days    Jhostin Epps 02/21/2012

## 2012-02-21 NOTE — Discharge Summary (Signed)
General Surgery [facility] - Attending  Patient prepared for discharge.  I discussed need for surgery with patient and her husband.  Plan laparotomy for cholecystectomy and segmental colectomy in 4-6 weeks.  Drain to remain in place.  Patient to remain on oral antibiotics until time of surgery.  Will see in office in 3 weeks for follow up and operative planning.  Velora Heckler, MD, [facility] Office: [phone]

## 2012-02-21 NOTE — Progress Notes (Signed)
General Surgery [facility] - Attending  Anticipating discharge today.  See discharge notes.  Velora Heckler, MD, [facility] Office: [phone]

## 2012-02-22 DIAGNOSIS — Z4801 Encounter for change or removal of surgical wound dressing: Secondary | ICD-10-CM | POA: Diagnosis not present

## 2012-02-22 DIAGNOSIS — Z8719 Personal history of other diseases of the digestive system: Secondary | ICD-10-CM | POA: Diagnosis not present

## 2012-02-22 DIAGNOSIS — K651 Peritoneal abscess: Secondary | ICD-10-CM | POA: Diagnosis not present

## 2012-02-22 DIAGNOSIS — I10 Essential (primary) hypertension: Secondary | ICD-10-CM | POA: Diagnosis not present

## 2012-02-22 DIAGNOSIS — E119 Type 2 diabetes mellitus without complications: Secondary | ICD-10-CM | POA: Diagnosis not present

## 2012-02-24 ENCOUNTER — Telehealth (INDEPENDENT_AMBULATORY_CARE_PROVIDER_SITE_OTHER): Payer: Self-pay

## 2012-02-24 DIAGNOSIS — I10 Essential (primary) hypertension: Secondary | ICD-10-CM | POA: Diagnosis not present

## 2012-02-24 DIAGNOSIS — K8020 Calculus of gallbladder without cholecystitis without obstruction: Secondary | ICD-10-CM | POA: Diagnosis not present

## 2012-02-24 DIAGNOSIS — E782 Mixed hyperlipidemia: Secondary | ICD-10-CM | POA: Diagnosis not present

## 2012-02-24 DIAGNOSIS — E119 Type 2 diabetes mellitus without complications: Secondary | ICD-10-CM | POA: Diagnosis not present

## 2012-02-24 NOTE — Telephone Encounter (Signed)
Message copied by Joanette Gula on Mon Feb 24, 2012  9:20 AM ------      Message from: Velora Heckler      Created: Fri Feb 21, 2012  2:15 PM       Will need to see this patient in office in 3 weeks to discuss surgery.  Will likely need to schedule gastrograffin enema after that office visit - remind me!      Thanks,      tmg

## 2012-02-25 DIAGNOSIS — Z8719 Personal history of other diseases of the digestive system: Secondary | ICD-10-CM | POA: Diagnosis not present

## 2012-02-25 DIAGNOSIS — K651 Peritoneal abscess: Secondary | ICD-10-CM | POA: Diagnosis not present

## 2012-02-25 DIAGNOSIS — E119 Type 2 diabetes mellitus without complications: Secondary | ICD-10-CM | POA: Diagnosis not present

## 2012-02-25 DIAGNOSIS — Z4801 Encounter for change or removal of surgical wound dressing: Secondary | ICD-10-CM | POA: Diagnosis not present

## 2012-02-25 DIAGNOSIS — I10 Essential (primary) hypertension: Secondary | ICD-10-CM | POA: Diagnosis not present

## 2012-02-28 DIAGNOSIS — E119 Type 2 diabetes mellitus without complications: Secondary | ICD-10-CM | POA: Diagnosis not present

## 2012-02-28 DIAGNOSIS — Z8719 Personal history of other diseases of the digestive system: Secondary | ICD-10-CM | POA: Diagnosis not present

## 2012-02-28 DIAGNOSIS — K651 Peritoneal abscess: Secondary | ICD-10-CM | POA: Diagnosis not present

## 2012-02-28 DIAGNOSIS — Z4801 Encounter for change or removal of surgical wound dressing: Secondary | ICD-10-CM | POA: Diagnosis not present

## 2012-02-28 DIAGNOSIS — I10 Essential (primary) hypertension: Secondary | ICD-10-CM | POA: Diagnosis not present

## 2012-03-02 ENCOUNTER — Telehealth (INDEPENDENT_AMBULATORY_CARE_PROVIDER_SITE_OTHER): Payer: Self-pay | Admitting: General Surgery

## 2012-03-02 ENCOUNTER — Telehealth (INDEPENDENT_AMBULATORY_CARE_PROVIDER_SITE_OTHER): Payer: Self-pay

## 2012-03-02 NOTE — Telephone Encounter (Signed)
Kelly Farmer, with [facility], calling for order on dressing changes to drain.  Pt was D/C home from hospital with drain.  Site is healthy, and now needs only gauze to the site, but nurse needs order for that.  Nurse who opened case had different orders.  Please call Kelly Farmer on her cell:  [phone].

## 2012-03-02 NOTE — Telephone Encounter (Signed)
Question WU:JWJXB dsg reviewed with Dr Derrell Lolling. Per Dr Derrell Lolling clean drain site and apply clean dry  4x4 gause split to cover drain. Called to [place] at [facility].

## 2012-03-03 DIAGNOSIS — K651 Peritoneal abscess: Secondary | ICD-10-CM | POA: Diagnosis not present

## 2012-03-03 DIAGNOSIS — I10 Essential (primary) hypertension: Secondary | ICD-10-CM | POA: Diagnosis not present

## 2012-03-03 DIAGNOSIS — Z8719 Personal history of other diseases of the digestive system: Secondary | ICD-10-CM | POA: Diagnosis not present

## 2012-03-03 DIAGNOSIS — Z4801 Encounter for change or removal of surgical wound dressing: Secondary | ICD-10-CM | POA: Diagnosis not present

## 2012-03-03 DIAGNOSIS — E119 Type 2 diabetes mellitus without complications: Secondary | ICD-10-CM | POA: Diagnosis not present

## 2012-03-05 ENCOUNTER — Telehealth (INDEPENDENT_AMBULATORY_CARE_PROVIDER_SITE_OTHER): Payer: Self-pay | Admitting: General Surgery

## 2012-03-05 ENCOUNTER — Telehealth (INDEPENDENT_AMBULATORY_CARE_PROVIDER_SITE_OTHER): Payer: Self-pay

## 2012-03-05 NOTE — Telephone Encounter (Signed)
Pt has called home health nurse to report decreased output from drain today, only 5 ml.  Has been 20-25 ml.  Nurse is going to see her tomorrow and will milk tubing for clots and check output overnight.

## 2012-03-05 NOTE — Telephone Encounter (Signed)
Beth at [facility] called to let us know she will continue to see pt twice a week to monitor drain. She will send order to be signed.

## 2012-03-06 DIAGNOSIS — Z8719 Personal history of other diseases of the digestive system: Secondary | ICD-10-CM | POA: Diagnosis not present

## 2012-03-06 DIAGNOSIS — K651 Peritoneal abscess: Secondary | ICD-10-CM | POA: Diagnosis not present

## 2012-03-06 DIAGNOSIS — I10 Essential (primary) hypertension: Secondary | ICD-10-CM | POA: Diagnosis not present

## 2012-03-06 DIAGNOSIS — Z4801 Encounter for change or removal of surgical wound dressing: Secondary | ICD-10-CM | POA: Diagnosis not present

## 2012-03-06 DIAGNOSIS — E119 Type 2 diabetes mellitus without complications: Secondary | ICD-10-CM | POA: Diagnosis not present

## 2012-03-09 DIAGNOSIS — Z4801 Encounter for change or removal of surgical wound dressing: Secondary | ICD-10-CM | POA: Diagnosis not present

## 2012-03-09 DIAGNOSIS — Z8719 Personal history of other diseases of the digestive system: Secondary | ICD-10-CM | POA: Diagnosis not present

## 2012-03-09 DIAGNOSIS — K651 Peritoneal abscess: Secondary | ICD-10-CM | POA: Diagnosis not present

## 2012-03-09 DIAGNOSIS — I10 Essential (primary) hypertension: Secondary | ICD-10-CM | POA: Diagnosis not present

## 2012-03-09 DIAGNOSIS — E119 Type 2 diabetes mellitus without complications: Secondary | ICD-10-CM | POA: Diagnosis not present

## 2012-03-16 ENCOUNTER — Telehealth (INDEPENDENT_AMBULATORY_CARE_PROVIDER_SITE_OTHER): Payer: Self-pay | Admitting: General Surgery

## 2012-03-16 DIAGNOSIS — Z4801 Encounter for change or removal of surgical wound dressing: Secondary | ICD-10-CM | POA: Diagnosis not present

## 2012-03-16 DIAGNOSIS — E119 Type 2 diabetes mellitus without complications: Secondary | ICD-10-CM | POA: Diagnosis not present

## 2012-03-16 DIAGNOSIS — Z8719 Personal history of other diseases of the digestive system: Secondary | ICD-10-CM | POA: Diagnosis not present

## 2012-03-16 DIAGNOSIS — K651 Peritoneal abscess: Secondary | ICD-10-CM | POA: Diagnosis not present

## 2012-03-16 DIAGNOSIS — I10 Essential (primary) hypertension: Secondary | ICD-10-CM | POA: Diagnosis not present

## 2012-03-16 NOTE — Telephone Encounter (Signed)
Beth, nurse with [facility], calling to report pt has had NO drainage from her JP drain since last Thursday.

## 2012-03-17 ENCOUNTER — Telehealth (INDEPENDENT_AMBULATORY_CARE_PROVIDER_SITE_OTHER): Payer: Self-pay | Admitting: General Surgery

## 2012-03-17 NOTE — Telephone Encounter (Signed)
Beth, nurse with [facility], calling to follow-up on drainage tube.  Per Dr. Gerrit Friends, leave tube as is.  She has appt next week and he will assess for removal then.  Home health nurse updated and understands.

## 2012-03-24 ENCOUNTER — Encounter (INDEPENDENT_AMBULATORY_CARE_PROVIDER_SITE_OTHER): Payer: Self-pay | Admitting: Surgery

## 2012-03-24 ENCOUNTER — Ambulatory Visit (INDEPENDENT_AMBULATORY_CARE_PROVIDER_SITE_OTHER): Payer: Medicare Other | Admitting: Surgery

## 2012-03-24 VITALS — BP 128/72 | HR 88 | Temp 97.2°F | Resp 16 | Ht 60.0 in | Wt 161.8 lb

## 2012-03-24 DIAGNOSIS — K810 Acute cholecystitis: Secondary | ICD-10-CM | POA: Diagnosis not present

## 2012-03-24 DIAGNOSIS — K632 Fistula of intestine: Secondary | ICD-10-CM

## 2012-03-24 DIAGNOSIS — K651 Peritoneal abscess: Secondary | ICD-10-CM

## 2012-03-24 NOTE — Progress Notes (Signed)
Visit Diagnoses: 1. Right upper quadrant abdominal abscess   2. Suppurative cholecystitis   3. Colonic Fistula     HISTORY: Patient is a 71 year old black female who was hospitalized on the general surgery service with right upper quadrant abdominal abscess, cholecystitis, and colonic fistula. Patient has done well with oral antibiotics and percutaneous drainage. She now returns to schedule further diagnostic studies and prepare for surgical resection.  PERTINENT REVIEW OF SYSTEMS: Tolerating a normal diet, normal bowel function, no bleeding per rectum, denies fevers and chills  EXAM: HEENT: normocephalic; pupils equal and reactive; sclerae clear; dentition good; mucous membranes moist NECK:  symmetric on extension; no palpable anterior or posterior cervical lymphadenopathy; no supraclavicular masses; no tenderness CHEST: clear to auscultation bilaterally without rales, rhonchi, or wheezes CARDIAC: regular rate and rhythm without significant murmur; peripheral pulses are full ABDOMEN: Soft without distention; drainage catheter right upper quadrant just below costal margin; no palpable masses, no tenderness EXT:  non-tender without edema; no deformity NEURO: no gross focal deficits; no sign of tremor   IMPRESSION: Acute cholecystitis, intra-abdominal abscess, colonic fistula  PLAN: I reviewed the patient's clinical course with the patient and her husband today. She has done quite well. She is anxious to have the percutaneous drainage catheter removed. Given the fact that there may be a colonic fistula, I am going to leave the drainage catheter in place until the time of surgery and she will remain on oral antibiotics until the time of surgery.  Patient will undergo a Gastrografin enema to evaluate the colon for mass or possible fistula. We will go ahead and schedule the patient for exploratory laparotomy, cholecystectomy, and right colectomy in the near future. Risk and benefits of the  discussed with the patient including the possibility of colostomy placement. She understands and wishes to proceed.  The risks and benefits of the procedure have been discussed at length with the patient.  The patient understands the proposed procedure, potential alternative treatments, and the course of recovery to be expected.  All of the patient's questions have been answered at this time.  The patient wishes to proceed with surgery.  Velora Heckler, MD, FACS General & Endocrine Surgery [facility]

## 2012-03-24 NOTE — Patient Instructions (Signed)
[facility] Office: [phone]  OPEN ABDOMINAL SURGERY: POST OP INSTRUCTIONS  Always review your discharge instruction sheet given to you by the facility where your surgery was performed.  1. A prescription for pain medication may be given to you upon discharge.  Take your pain medication as prescribed, if needed.  If narcotic pain medicine is not needed, then you may take acetaminophen (Tylenol) or ibuprofen (Advil) as needed. 2. Take your usually prescribed medications unless otherwise directed. 3. If you need a refill on your pain medication, please contact your pharmacy. They will contact our office to request authorization.  Prescriptions will not be filled after 5pm or on weekends. 4. You should follow a light diet the first few days after arrival home, such as soup and crackers, unless your doctor has advised otherwise. A high-fiber, low fat diet can be resumed as tolerated.  Be sure to include lots of fluids daily.  5. Most patients will experience some swelling and bruising in the area of the incision. Ice packs will help. Swelling and bruising can take several days to resolve. 6. It is common to experience some constipation if taking pain medication after surgery.  Increasing fluid intake and taking a stool softener will usually help or prevent this problem from occurring.  A mild laxative (Milk of Magnesia or Miralax) should be taken according to package directions if there are no bowel movements after 48 hours. 7.  You may have steri-strips (small skin tapes) in place directly over the incision.  These strips should be left on the skin for 7-10 days.  If your surgeon used skin glue on the incision, you may shower in 24 hours.  The glue will flake off over the next 2-3 weeks.  Any sutures or staples will be removed at the office during your follow-up visit. You may find that a light gauze bandage over your incision may keep your staples from being rubbed or pulled. You may  shower and replace the bandage daily. 8. ACTIVITIES:  You may resume regular (light) daily activities beginning the next day--such as daily self-care, walking, climbing stairs--gradually increasing activities as tolerated.  You may have sexual intercourse when it is comfortable.  Refrain from any heavy lifting or straining until approved by your doctor.  You may drive when you no longer are taking prescription pain medication, you can comfortably wear a seatbelt, and you can safely maneuver your car and apply brakes. 9. You should see your doctor in the office for a follow-up appointment approximately two weeks after your surgery.  Make sure that you call for this appointment within a day or two after you arrive home to insure a convenient appointment time.  WHEN TO CALL YOUR DOCTOR: 1. Fever over 101.0 2. Inability to urinate 3. Nausea and/or vomiting 4. Extreme swelling or bruising 5. Continued bleeding from incision. 6. Increased pain, redness, or drainage from the incision. 7. Difficulty swallowing or breathing 8. Muscle cramping or spasms. 9. Numbness or tingling in hands or feet or around lips.  IF YOU HAVE DISABILITY OR FAMILY LEAVE FORMS, YOU MUST BRING THEM TO THE OFFICE FOR PROCESSING.  PLEASE DO NOT GIVE THEM TO YOUR DOCTOR.  The clinic staff is available to answer your questions during regular business hours.  Please don't hesitate to call and ask to speak to one of the nurses if you have concerns.  For further questions, please visit [email or web address]    

## 2012-03-27 ENCOUNTER — Encounter (HOSPITAL_COMMUNITY): Payer: Self-pay | Admitting: Pharmacy Technician

## 2012-03-27 DIAGNOSIS — I10 Essential (primary) hypertension: Secondary | ICD-10-CM | POA: Diagnosis not present

## 2012-03-27 DIAGNOSIS — Z4801 Encounter for change or removal of surgical wound dressing: Secondary | ICD-10-CM | POA: Diagnosis not present

## 2012-03-27 DIAGNOSIS — K651 Peritoneal abscess: Secondary | ICD-10-CM | POA: Diagnosis not present

## 2012-03-27 DIAGNOSIS — E119 Type 2 diabetes mellitus without complications: Secondary | ICD-10-CM | POA: Diagnosis not present

## 2012-03-27 DIAGNOSIS — Z8719 Personal history of other diseases of the digestive system: Secondary | ICD-10-CM | POA: Diagnosis not present

## 2012-04-02 DIAGNOSIS — K651 Peritoneal abscess: Secondary | ICD-10-CM | POA: Diagnosis not present

## 2012-04-02 DIAGNOSIS — Z8719 Personal history of other diseases of the digestive system: Secondary | ICD-10-CM | POA: Diagnosis not present

## 2012-04-02 DIAGNOSIS — E119 Type 2 diabetes mellitus without complications: Secondary | ICD-10-CM | POA: Diagnosis not present

## 2012-04-02 DIAGNOSIS — I10 Essential (primary) hypertension: Secondary | ICD-10-CM | POA: Diagnosis not present

## 2012-04-02 DIAGNOSIS — Z4801 Encounter for change or removal of surgical wound dressing: Secondary | ICD-10-CM | POA: Diagnosis not present

## 2012-04-03 ENCOUNTER — Ambulatory Visit
Admission: RE | Admit: 2012-04-03 | Discharge: 2012-04-03 | Disposition: A | Discharge status: Home / Self Care | Payer: Medicare Other | Source: Ambulatory Visit | Attending: Surgery | Admitting: Surgery

## 2012-04-03 DIAGNOSIS — K632 Fistula of intestine: Secondary | ICD-10-CM

## 2012-04-03 DIAGNOSIS — K5730 Diverticulosis of large intestine without perforation or abscess without bleeding: Secondary | ICD-10-CM | POA: Diagnosis not present

## 2012-04-08 ENCOUNTER — Encounter (HOSPITAL_COMMUNITY): Payer: Self-pay

## 2012-04-08 ENCOUNTER — Encounter (HOSPITAL_COMMUNITY)
Admission: RE | Admit: 2012-04-08 | Discharge: 2012-04-08 | Disposition: A | Discharge status: Home / Self Care | Payer: Medicare Other | Source: Ambulatory Visit | Attending: Surgery | Admitting: Surgery

## 2012-04-08 HISTORY — DX: Unspecified abdominal pain: R10.9

## 2012-04-08 HISTORY — DX: Gastro-esophageal reflux disease without esophagitis: K21.9

## 2012-04-08 LAB — CBC
HCT: 36.5 % (ref 36.0–46.0)
Hemoglobin: 11.3 g/dL — ABNORMAL LOW (ref 12.0–15.0)
MCH: 25.9 pg — ABNORMAL LOW (ref 26.0–34.0)
MCHC: 31 g/dL (ref 30.0–36.0)
MCV: 83.7 fL (ref 78.0–100.0)
Platelets: 292 10*3/uL (ref 150–400)
RBC: 4.36 MIL/uL (ref 3.87–5.11)
RDW: 16.6 % — ABNORMAL HIGH (ref 11.5–15.5)
WBC: 6.4 10*3/uL (ref 4.0–10.5)

## 2012-04-08 LAB — COMPREHENSIVE METABOLIC PANEL
ALT: 21 U/L (ref 0–35)
AST: 20 U/L (ref 0–37)
Albumin: 3.7 g/dL (ref 3.5–5.2)
Alkaline Phosphatase: 90 U/L (ref 39–117)
BUN: 9 mg/dL (ref 6–23)
CO2: 26 mEq/L (ref 19–32)
Calcium: 10 mg/dL (ref 8.4–10.5)
Chloride: 102 mEq/L (ref 96–112)
Creatinine, Ser: 0.58 mg/dL (ref 0.50–1.10)
GFR calc Af Amer: >90 mL/min (ref >90)
GFR calc non Af Amer: >90 mL/min (ref >90)
Glucose, Bld: 142 mg/dL — ABNORMAL HIGH (ref 70–99)
Potassium: 4 mEq/L (ref 3.5–5.1)
Sodium: 138 mEq/L (ref 135–145)
Total Bilirubin: 0.3 mg/dL (ref 0.3–1.2)
Total Protein: 7.2 g/dL (ref 6.0–8.3)

## 2012-04-08 LAB — SURGICAL PCR SCREEN
MRSA, PCR: NEGATIVE
Staphylococcus aureus: NEGATIVE

## 2012-04-08 LAB — PROTIME-INR
INR: 0.98 (ref 0.00–1.49)
Prothrombin Time: 13.2 seconds (ref 11.6–15.2)

## 2012-04-08 NOTE — Pre-Procedure Instructions (Signed)
PT'S EKG AND CXR REPORTS FROM [facility] 02/13/12 AND DISCHARGE SUMMARY 02/21/12 [facility] -ON THIS CHART AND IN EPIC. CBC,CMET,PT,T/S WERE DONE TODAY PREOP AT [facility]. PREOP TEACHING WAS DONE USING THE TEACH BACK METHOD.

## 2012-04-08 NOTE — Patient Instructions (Signed)
YOUR SURGERY IS SCHEDULED ON:  Friday  6/7  AT 10:00 AM  REPORT TO [facility] SHORT STAY CENTER AT:  8:00 AM      PHONE # FOR SHORT STAY IS [phone]  FOLLOW YOUR BOWEL PREP INSTRUCTIONS FROM DR. GERKIN'S OFFICE - DAY BEFORE SURGERY  DO NOT EAT OR DRINK ANYTHING AFTER MIDNIGHT THE NIGHT BEFORE YOUR SURGERY.  YOU MAY BRUSH YOUR TEETH, RINSE OUT YOUR MOUTH--BUT NO WATER, NO FOOD, NO CHEWING GUM, NO MINTS, NO CANDIES, NO CHEWING TOBACCO.  PLEASE TAKE THE FOLLOWING MEDICATIONS THE AM OF YOUR SURGERY WITH A FEW SIPS OF WATER:  NO MEDS TO TAKE AM OF SURGERY.    IF YOU USE INHALERS--USE YOUR INHALERS THE AM OF YOUR SURGERY AND BRING INHALERS TO THE HOSPITAL -TAKE TO SURGERY.    IF YOU ARE DIABETIC:  DO NOT TAKE ANY DIABETIC MEDICATIONS THE AM OF YOUR SURGERY.  IF YOU TAKE INSULIN IN THE EVENINGS--PLEASE ONLY TAKE 1/2 NORMAL EVENING DOSE THE NIGHT BEFORE YOUR SURGERY.  NO INSULIN THE AM OF YOUR SURGERY.  IF YOU HAVE SLEEP APNEA AND USE CPAP OR BIPAP--PLEASE BRING THE MASK --NOT THE MACHINE-NOT THE TUBING   -JUST THE MASK. DO NOT BRING VALUABLES, MONEY, CREDIT CARDS.  CONTACT LENS, DENTURES / PARTIALS, GLASSES SHOULD NOT BE WORN TO SURGERY AND IN MOST CASES-HEARING AIDS WILL NEED TO BE REMOVED.  BRING YOUR GLASSES CASE, ANY EQUIPMENT NEEDED FOR YOUR CONTACT LENS. FOR PATIENTS ADMITTED TO THE HOSPITAL--CHECK OUT TIME THE DAY OF DISCHARGE IS 11:00 AM.  ALL INPATIENT ROOMS ARE PRIVATE - WITH BATHROOM, TELEPHONE, TELEVISION AND WIFI INTERNET. IF YOU ARE BEING DISCHARGED THE SAME DAY OF YOUR SURGERY--YOU CAN NOT DRIVE YOURSELF HOME--AND SHOULD NOT GO HOME ALONE BY TAXI OR BUS.  NO DRIVING OR OPERATING MACHINERY FOR 24 HOURS FOLLOWING ANESTHESIA / PAIN MEDICATIONS.                            SPECIAL INSTRUCTIONS:  CHLORHEXIDINE SOAP SHOWER (other brand names are Betasept and Hibiclens ) PLEASE SHOWER WITH CHLORHEXIDINE THE NIGHT BEFORE YOUR SURGERY AND THE AM OF YOUR SURGERY. DO NOT USE CHLORHEXIDINE  ON YOUR FACE OR PRIVATE AREAS--YOU MAY USE YOUR NORMAL SOAP THOSE AREAS AND YOUR NORMAL SHAMPOO.  WOMEN SHOULD AVOID SHAVING UNDER ARMS AND SHAVING LEGS 48 HOURS BEFORE USING CHLORHEXIDINE TO AVOID SKIN IRRITATION.  DO NOT USE IF ALLERGIC TO CHLORHEXIDINE.  PLEASE READ OVER ANY  FACT SHEETS THAT YOU WERE GIVEN: MRSA INFORMATION, BLOOD TRANSFUSION INFORMATION

## 2012-04-09 ENCOUNTER — Telehealth (INDEPENDENT_AMBULATORY_CARE_PROVIDER_SITE_OTHER): Payer: Self-pay

## 2012-04-09 DIAGNOSIS — Z4801 Encounter for change or removal of surgical wound dressing: Secondary | ICD-10-CM | POA: Diagnosis not present

## 2012-04-09 DIAGNOSIS — I10 Essential (primary) hypertension: Secondary | ICD-10-CM | POA: Diagnosis not present

## 2012-04-09 DIAGNOSIS — K651 Peritoneal abscess: Secondary | ICD-10-CM | POA: Diagnosis not present

## 2012-04-09 DIAGNOSIS — Z8719 Personal history of other diseases of the digestive system: Secondary | ICD-10-CM | POA: Diagnosis not present

## 2012-04-09 DIAGNOSIS — E119 Type 2 diabetes mellitus without complications: Secondary | ICD-10-CM | POA: Diagnosis not present

## 2012-04-09 NOTE — Telephone Encounter (Signed)
Prep instructions reviewed with pt. She states she did start prep at 10:30 and will start her antibiotics. Pt to stay on clear liquids until midnight then npo. Pt states she understands.

## 2012-04-09 NOTE — Telephone Encounter (Signed)
The nurse went out to see the patient and she was confused about her bowel prep.  Surgery is tomorrow.   She has not started the prep.   Please call the patient to go over it  [phone]

## 2012-04-09 NOTE — Progress Notes (Signed)
Quick Note:  These results are acceptable for scheduled surgery.  Keyion Knack M. Lexani Corona, MD, FACS [facility] Office: [phone]   ______ 

## 2012-04-10 ENCOUNTER — Ambulatory Visit (HOSPITAL_COMMUNITY): Payer: Medicare Other | Admitting: Registered Nurse

## 2012-04-10 ENCOUNTER — Ambulatory Visit (HOSPITAL_COMMUNITY): Payer: Medicare Other

## 2012-04-10 ENCOUNTER — Encounter (HOSPITAL_COMMUNITY)
Admission: RE | Disposition: A | Discharge status: Home / Self Care | Payer: Self-pay | Source: Ambulatory Visit | Attending: Surgery

## 2012-04-10 ENCOUNTER — Encounter (HOSPITAL_COMMUNITY): Payer: Self-pay | Admitting: *Deleted

## 2012-04-10 ENCOUNTER — Encounter (HOSPITAL_COMMUNITY): Payer: Self-pay | Admitting: Registered Nurse

## 2012-04-10 ENCOUNTER — Inpatient Hospital Stay (HOSPITAL_COMMUNITY)
Admission: RE | Admit: 2012-04-10 | Discharge: 2012-04-20 | DRG: 415 | Disposition: A | Discharge status: Home / Self Care | Payer: Medicare Other | Source: Ambulatory Visit | Attending: Surgery | Admitting: Surgery

## 2012-04-10 DIAGNOSIS — K5289 Other specified noninfective gastroenteritis and colitis: Secondary | ICD-10-CM | POA: Diagnosis not present

## 2012-04-10 DIAGNOSIS — K651 Peritoneal abscess: Secondary | ICD-10-CM | POA: Diagnosis present

## 2012-04-10 DIAGNOSIS — R112 Nausea with vomiting, unspecified: Secondary | ICD-10-CM | POA: Diagnosis not present

## 2012-04-10 DIAGNOSIS — Z01812 Encounter for preprocedural laboratory examination: Secondary | ICD-10-CM

## 2012-04-10 DIAGNOSIS — K219 Gastro-esophageal reflux disease without esophagitis: Secondary | ICD-10-CM | POA: Diagnosis present

## 2012-04-10 DIAGNOSIS — K819 Cholecystitis, unspecified: Secondary | ICD-10-CM | POA: Diagnosis not present

## 2012-04-10 DIAGNOSIS — K560 Paralytic ileus: Secondary | ICD-10-CM | POA: Diagnosis not present

## 2012-04-10 DIAGNOSIS — I10 Essential (primary) hypertension: Secondary | ICD-10-CM | POA: Diagnosis not present

## 2012-04-10 DIAGNOSIS — K823 Fistula of gallbladder: Secondary | ICD-10-CM

## 2012-04-10 DIAGNOSIS — K631 Perforation of intestine (nontraumatic): Secondary | ICD-10-CM | POA: Diagnosis not present

## 2012-04-10 DIAGNOSIS — K632 Fistula of intestine: Secondary | ICD-10-CM | POA: Diagnosis not present

## 2012-04-10 DIAGNOSIS — K6389 Other specified diseases of intestine: Secondary | ICD-10-CM | POA: Diagnosis not present

## 2012-04-10 DIAGNOSIS — E119 Type 2 diabetes mellitus without complications: Secondary | ICD-10-CM | POA: Diagnosis not present

## 2012-04-10 DIAGNOSIS — Z9089 Acquired absence of other organs: Secondary | ICD-10-CM | POA: Diagnosis not present

## 2012-04-10 DIAGNOSIS — K812 Acute cholecystitis with chronic cholecystitis: Secondary | ICD-10-CM

## 2012-04-10 HISTORY — PX: LAPAROTOMY: SHX154

## 2012-04-10 HISTORY — PX: COLOSTOMY REVISION: SHX5232

## 2012-04-10 HISTORY — PX: CHOLECYSTECTOMY: SHX55

## 2012-04-10 LAB — TYPE AND SCREEN
ABO/RH(D): A POS
Antibody Screen: NEGATIVE

## 2012-04-10 LAB — GLUCOSE, CAPILLARY
Glucose-Capillary: 144 mg/dL — ABNORMAL HIGH (ref 70–99)
Glucose-Capillary: 194 mg/dL — ABNORMAL HIGH (ref 70–99)

## 2012-04-10 SURGERY — LAPAROTOMY, EXPLORATORY
Anesthesia: General | Wound class: Clean Contaminated

## 2012-04-10 MED ORDER — GLYCOPYRROLATE 0.2 MG/ML IJ SOLN
INTRAMUSCULAR | Status: DC | PRN
  Administered 2012-04-10: .6 mg via INTRAVENOUS

## 2012-04-10 MED ORDER — 0.9 % SODIUM CHLORIDE (POUR BTL) OPTIME
TOPICAL | Status: DC | PRN
  Administered 2012-04-10: 1000 mL

## 2012-04-10 MED ORDER — NEOSTIGMINE METHYLSULFATE 1 MG/ML IJ SOLN
INTRAMUSCULAR | Status: DC | PRN
  Administered 2012-04-10: 4 mg via INTRAVENOUS

## 2012-04-10 MED ORDER — LACTATED RINGERS IV SOLN
INTRAVENOUS | Status: DC | PRN
  Administered 2012-04-10 (×4): via INTRAVENOUS

## 2012-04-10 MED ORDER — ACETAMINOPHEN 325 MG PO TABS: 650.0000 mg | ORAL_TABLET | Freq: Four times a day (QID) | ORAL | Status: DC | PRN

## 2012-04-10 MED ORDER — ONDANSETRON HCL 4 MG/2ML IJ SOLN
INTRAMUSCULAR | Status: DC | PRN
  Administered 2012-04-10: 4 mg via INTRAVENOUS

## 2012-04-10 MED ORDER — SODIUM CHLORIDE 0.9 % IV SOLN
1.0000 g | INTRAVENOUS | Status: AC
  Administered 2012-04-10: 1 g via INTRAVENOUS

## 2012-04-10 MED ORDER — EPHEDRINE SULFATE 50 MG/ML IJ SOLN
INTRAMUSCULAR | Status: DC | PRN
  Administered 2012-04-10: 5 mg via INTRAVENOUS

## 2012-04-10 MED ORDER — HYDROMORPHONE HCL PF 1 MG/ML IJ SOLN
INTRAMUSCULAR | Status: DC | PRN
  Administered 2012-04-10: .25 mg via INTRAVENOUS
  Administered 2012-04-10 (×2): 0.5 mg via INTRAVENOUS
  Administered 2012-04-10: .75 mg via INTRAVENOUS

## 2012-04-10 MED ORDER — CISATRACURIUM BESYLATE (PF) 10 MG/5ML IV SOLN
INTRAVENOUS | Status: DC | PRN
  Administered 2012-04-10: 3 mg via INTRAVENOUS
  Administered 2012-04-10: 2 mg via INTRAVENOUS
  Administered 2012-04-10: 4 mg via INTRAVENOUS

## 2012-04-10 MED ORDER — TELMISARTAN-HCTZ 80-12.5 MG PO TABS: 1.0000 | ORAL_TABLET | Freq: Every day | ORAL | Status: DC

## 2012-04-10 MED ORDER — ROCURONIUM BROMIDE 100 MG/10ML IV SOLN
INTRAVENOUS | Status: DC | PRN
  Administered 2012-04-10: 5 mg via INTRAVENOUS

## 2012-04-10 MED ORDER — LACTATED RINGERS IV SOLN
INTRAVENOUS | Status: DC
  Administered 2012-04-10: 1000 mL via INTRAVENOUS

## 2012-04-10 MED ORDER — HYDROMORPHONE HCL PF 1 MG/ML IJ SOLN
INTRAMUSCULAR | Status: AC
  Filled 2012-04-10: qty 1

## 2012-04-10 MED ORDER — ONDANSETRON HCL 4 MG/2ML IJ SOLN
4.0000 mg | Freq: Four times a day (QID) | INTRAMUSCULAR | Status: DC | PRN
  Administered 2012-04-11 – 2012-04-20 (×10): 4 mg via INTRAVENOUS
  Filled 2012-04-10 (×10): qty 2

## 2012-04-10 MED ORDER — IOHEXOL 300 MG/ML  SOLN
INTRAMUSCULAR | Status: AC
  Filled 2012-04-10: qty 1

## 2012-04-10 MED ORDER — DIPHENHYDRAMINE HCL 50 MG/ML IJ SOLN: 12.5000 mg | Freq: Four times a day (QID) | INTRAMUSCULAR | Status: DC | PRN

## 2012-04-10 MED ORDER — MORPHINE SULFATE (PF) 1 MG/ML IV SOLN
INTRAVENOUS | Status: AC
  Filled 2012-04-10: qty 25

## 2012-04-10 MED ORDER — ONDANSETRON HCL 4 MG PO TABS: 4.0000 mg | ORAL_TABLET | Freq: Four times a day (QID) | ORAL | Status: DC | PRN

## 2012-04-10 MED ORDER — ONDANSETRON HCL 4 MG/2ML IJ SOLN: 4.0000 mg | Freq: Four times a day (QID) | INTRAMUSCULAR | Status: DC | PRN

## 2012-04-10 MED ORDER — SODIUM CHLORIDE 0.9 % IJ SOLN: 9.0000 mL | INTRAMUSCULAR | Status: DC | PRN

## 2012-04-10 MED ORDER — MORPHINE SULFATE (PF) 1 MG/ML IV SOLN
INTRAVENOUS | Status: DC
  Administered 2012-04-10: 3 mg via INTRAVENOUS
  Administered 2012-04-10: 14:00:00 via INTRAVENOUS
  Administered 2012-04-11 (×2): 3 mg via INTRAVENOUS
  Administered 2012-04-11: 25 mg via INTRAVENOUS
  Administered 2012-04-12: 4 mg via INTRAVENOUS
  Administered 2012-04-12: 2 mg via INTRAVENOUS
  Filled 2012-04-10: qty 25

## 2012-04-10 MED ORDER — IRBESARTAN 300 MG PO TABS
300.0000 mg | ORAL_TABLET | Freq: Every day | ORAL | Status: DC
  Administered 2012-04-10 – 2012-04-20 (×11): 300 mg via ORAL
  Filled 2012-04-10 (×11): qty 1

## 2012-04-10 MED ORDER — SODIUM CHLORIDE 0.9 % IV SOLN
10.0000 mg | INTRAVENOUS | Status: DC | PRN
  Administered 2012-04-10: 10 ug/min via INTRAVENOUS

## 2012-04-10 MED ORDER — SODIUM CHLORIDE 0.9 % IV SOLN
INTRAVENOUS | Status: AC
  Filled 2012-04-10: qty 1

## 2012-04-10 MED ORDER — HYDROMORPHONE HCL PF 1 MG/ML IJ SOLN
0.2500 mg | INTRAMUSCULAR | Status: DC | PRN
  Administered 2012-04-10 (×2): 0.25 mg via INTRAVENOUS
  Administered 2012-04-10: 0.5 mg via INTRAVENOUS

## 2012-04-10 MED ORDER — NALOXONE HCL 0.4 MG/ML IJ SOLN: 0.4000 mg | INTRAMUSCULAR | Status: DC | PRN

## 2012-04-10 MED ORDER — SUCCINYLCHOLINE CHLORIDE 20 MG/ML IJ SOLN
INTRAMUSCULAR | Status: DC | PRN
  Administered 2012-04-10: 100 mg via INTRAVENOUS

## 2012-04-10 MED ORDER — LIDOCAINE HCL (CARDIAC) 20 MG/ML IV SOLN
INTRAVENOUS | Status: DC | PRN
  Administered 2012-04-10: 50 mg via INTRAVENOUS

## 2012-04-10 MED ORDER — PHENYLEPHRINE HCL 10 MG/ML IJ SOLN
INTRAMUSCULAR | Status: DC | PRN
  Administered 2012-04-10 (×4): 40 ug via INTRAVENOUS

## 2012-04-10 MED ORDER — KCL IN DEXTROSE-NACL 20-5-0.45 MEQ/L-%-% IV SOLN
INTRAVENOUS | Status: AC
  Filled 2012-04-10: qty 1000

## 2012-04-10 MED ORDER — PROPOFOL 10 MG/ML IV EMUL
INTRAVENOUS | Status: DC | PRN
  Administered 2012-04-10: 160 mg via INTRAVENOUS

## 2012-04-10 MED ORDER — HYDROCHLOROTHIAZIDE 12.5 MG PO CAPS
12.5000 mg | ORAL_CAPSULE | Freq: Every day | ORAL | Status: DC
  Administered 2012-04-10 – 2012-04-20 (×11): 12.5 mg via ORAL
  Filled 2012-04-10 (×11): qty 1

## 2012-04-10 MED ORDER — DIPHENHYDRAMINE HCL 12.5 MG/5ML PO ELIX: 12.5000 mg | ORAL_SOLUTION | Freq: Four times a day (QID) | ORAL | Status: DC | PRN

## 2012-04-10 MED ORDER — KCL IN DEXTROSE-NACL 20-5-0.45 MEQ/L-%-% IV SOLN
INTRAVENOUS | Status: DC
  Administered 2012-04-10 – 2012-04-15 (×9): via INTRAVENOUS
  Filled 2012-04-10 (×11): qty 1000

## 2012-04-10 MED ORDER — LACTATED RINGERS IV SOLN: INTRAVENOUS | Status: DC

## 2012-04-10 MED ORDER — FENTANYL CITRATE 0.05 MG/ML IJ SOLN
INTRAMUSCULAR | Status: DC | PRN
  Administered 2012-04-10: 50 ug via INTRAVENOUS
  Administered 2012-04-10: 100 ug via INTRAVENOUS
  Administered 2012-04-10 (×4): 50 ug via INTRAVENOUS

## 2012-04-10 SURGICAL SUPPLY — 61 items
APPLICATOR COTTON TIP 6IN STRL (MISCELLANEOUS) ×6 IMPLANT
BLADE EXTENDED COATED 6.5IN (ELECTRODE) IMPLANT
BLADE HEX COATED 2.75 (ELECTRODE) ×3 IMPLANT
BLADE SURG SZ10 CARB STEEL (BLADE) IMPLANT
CANISTER SUCTION 2500CC (MISCELLANEOUS) ×3 IMPLANT
CATH REDDICK CHOLANGI 4FR 50CM (CATHETERS) ×2 IMPLANT
CLIP TI LARGE 6 (CLIP) IMPLANT
CLOTH BEACON ORANGE TIMEOUT ST (SAFETY) ×3 IMPLANT
COVER MAYO STAND STRL (DRAPES) ×5 IMPLANT
DRAIN CHANNEL RND F F (WOUND CARE) ×2 IMPLANT
DRAPE C-ARMOR (DRAPES) ×2 IMPLANT
DRAPE LAPAROSCOPIC ABDOMINAL (DRAPES) ×3 IMPLANT
DRAPE LG THREE QUARTER DISP (DRAPES) IMPLANT
DRAPE WARM FLUID 44X44 (DRAPE) ×3 IMPLANT
ELECT REM PT RETURN 9FT ADLT (ELECTROSURGICAL) ×3
ELECTRODE REM PT RTRN 9FT ADLT (ELECTROSURGICAL) ×2 IMPLANT
EVACUATOR DRAINAGE 10X20 100CC (DRAIN) ×1 IMPLANT
EVACUATOR SILICONE 100CC (DRAIN) ×3
GLOVE BIOGEL PI IND STRL 7.0 (GLOVE) ×2 IMPLANT
GLOVE BIOGEL PI INDICATOR 7.0 (GLOVE) ×1
GLOVE SURG ORTHO 8.0 STRL STRW (GLOVE) ×6 IMPLANT
GOWN STRL NON-REIN LRG LVL3 (GOWN DISPOSABLE) ×3 IMPLANT
GOWN STRL REIN XL XLG (GOWN DISPOSABLE) ×6 IMPLANT
KIT BASIN OR (CUSTOM PROCEDURE TRAY) ×3 IMPLANT
LEGGING LITHOTOMY PAIR STRL (DRAPES) IMPLANT
LIGASURE IMPACT 36 18CM CVD LR (INSTRUMENTS) IMPLANT
NS IRRIG 1000ML POUR BTL (IV SOLUTION) ×6 IMPLANT
PACK GENERAL/GYN (CUSTOM PROCEDURE TRAY) ×3 IMPLANT
RELOAD PROXIMATE 75MM BLUE (ENDOMECHANICALS) ×6 IMPLANT
RELOAD STAPLE 75 3.8 BLU REG (ENDOMECHANICALS) IMPLANT
RELOAD STAPLER LINE PROX 30 GR (STAPLE) ×2 IMPLANT
SCALPEL HARMONIC ACE (MISCELLANEOUS) IMPLANT
SEALER TISSUE X1 CVD JAW (INSTRUMENTS) ×2 IMPLANT
SPONGE GAUZE 4X4 12PLY (GAUZE/BANDAGES/DRESSINGS) ×3 IMPLANT
SPONGE LAP 18X18 X RAY DECT (DISPOSABLE) ×2 IMPLANT
STAPLER GUN LINEAR PROX 60 (STAPLE) ×2 IMPLANT
STAPLER PROXIMATE 75MM BLUE (STAPLE) ×2 IMPLANT
STAPLER RELOAD LINE PROX 30 GR (STAPLE) ×3
STAPLER RELOADABLE 30 GRN THCK (STAPLE) IMPLANT
STAPLER VISISTAT 35W (STAPLE) ×3 IMPLANT
SUCTION POOLE TIP (SUCTIONS) ×3 IMPLANT
SUT ETHILON 3 0 PS 1 (SUTURE) ×2 IMPLANT
SUT NOV 1 T60/GS (SUTURE) IMPLANT
SUT NOVA NAB DX-16 0-1 5-0 T12 (SUTURE) ×8 IMPLANT
SUT NOVA T20/GS 25 (SUTURE) IMPLANT
SUT SILK 2 0 (SUTURE) ×3
SUT SILK 2 0 SH CR/8 (SUTURE) ×3 IMPLANT
SUT SILK 2 0SH CR/8 30 (SUTURE) IMPLANT
SUT SILK 2-0 18XBRD TIE 12 (SUTURE) ×2 IMPLANT
SUT SILK 2-0 30XBRD TIE 12 (SUTURE) IMPLANT
SUT SILK 3 0 (SUTURE) ×6
SUT SILK 3 0 SH 30 (SUTURE) ×2 IMPLANT
SUT SILK 3 0 SH CR/8 (SUTURE) ×3 IMPLANT
SUT SILK 3-0 18XBRD TIE 12 (SUTURE) ×4 IMPLANT
SUT VIC AB 4-0 SH 18 (SUTURE) IMPLANT
SYR 20CC LL (SYRINGE) ×4 IMPLANT
SYR BULB IRRIGATION 50ML (SYRINGE) ×2 IMPLANT
TAPE CLOTH SURG 4X10 WHT LF (GAUZE/BANDAGES/DRESSINGS) ×2 IMPLANT
TOWEL OR 17X26 10 PK STRL BLUE (TOWEL DISPOSABLE) ×6 IMPLANT
TRAY FOLEY CATH 14FRSI W/METER (CATHETERS) ×3 IMPLANT
YANKAUER SUCT BULB TIP NO VENT (SUCTIONS) ×3 IMPLANT

## 2012-04-10 NOTE — Brief Op Note (Signed)
04/10/2012  1:35 PM  PATIENT:  Kelly Farmer  71 y.o. female  PRE-OPERATIVE DIAGNOSIS:  History of empyema of gallbladder, cholecystocolonic fistula  POST-OPERATIVE DIAGNOSIS:  same  PROCEDURE:  1.  Exploratory laparotomy  2. Right colectomy  3.  Cholecystectomy  SURGEON:  Surgeon(s) and Role:    * Velora Heckler, MD - Primary    * Wilmon Arms. Tsuei, MD - Assisting  ANESTHESIA:   general  EBL:  Total I/O In: 3000 [I.V.:3000] Out: 600 [Urine:400; Blood:200]  BLOOD ADMINISTERED:none  DRAINS: (1) Blake drain(s) in the RUQ beneath liver   LOCAL MEDICATIONS USED:  NONE  SPECIMEN:  Excision  DISPOSITION OF SPECIMEN:  PATHOLOGY  COUNTS:  YES  TOURNIQUET:  * No tourniquets in log *  DICTATION: .Other Dictation: Dictation Number [id number]  PLAN OF CARE: Admit to inpatient   PATIENT DISPOSITION:  PACU - hemodynamically stable.   Delay start of Pharmacological VTE agent (>24hrs) due to surgical blood loss or risk of bleeding: yes  Velora Heckler, MD, [facility] Office: [phone]

## 2012-04-10 NOTE — H&P (Signed)
Kelly Farmer is an 71 y.o. female.    Chief Complaint: RUQ inflammatory process with perc drain in gallbladder  HPI: Patient is a 71 year old black female who was hospitalized on the general surgery service in April 2013 with right upper quadrant abdominal abscess, cholecystitis, and colonic fistula. Patient has done well with oral antibiotics and percutaneous drainage. She now returns for surgical exploration, cholecystectomy, possible colectomy, and removal of drainage catheter.  Past Medical History  Diagnosis Date  . Hypercholesteremia   . Hypertension   . Arthritis   . Diabetes mellitus     ORAL MED-NO INSULIN  . GERD (gastroesophageal reflux disease)     OCCAS  . Abdominal pain     HOSP AT [facility] 02/13/12  TO  02/21/12 -DISCHARGE DX RUQ ABSCESS-MOST LIKELY GANGRENOUS CHLOLECYSTITIS WITH FISTULA TO THE COLON WITH ABSCESS;  DRAINAGE OF THE ABSCESS AND PLACEMENT OF DRAIN BY IR--PT STILL HAS THE DRAIN  TO DRAINAGE BAG AND IS ON ANTIBIOTIC    Past Surgical History  Procedure Date  . Abdominal hysterectomy   . Appendectomy   . Esophagogastroduodenoscopy 02/14/2012    Procedure: ESOPHAGOGASTRODUODENOSCOPY (EGD);  Surgeon: Graylin Shiver, MD;  Location: [facility];  Service: Endoscopy;  Laterality: N/A;    Family History  Problem Relation Age of Onset  . Cancer Sister     breast   Social History:  reports that she has quit smoking. She does not have any smokeless tobacco history on file. She reports that she does not drink alcohol or use illicit drugs.  Allergies: No Known Allergies  Medications Prior to Admission  Medication Sig Dispense Refill  . acetaminophen (TYLENOL) 325 MG tablet Take 2 tablets (650 mg total) by mouth every 6 (six) hours as needed for pain.      Marland Kitchen amoxicillin-clavulanate (AUGMENTIN) 875-125 MG per tablet Take 1 tablet by mouth 2 (two) times daily.      Marland Kitchen aspirin 325 MG tablet Take 325 mg by mouth daily with breakfast.       . ezetimibe (ZETIA) 10 MG  tablet Take 10 mg by mouth daily. AT NIGHT      . HYDROcodone-acetaminophen (NORCO) 5-325 MG per tablet Take 1 tablet by mouth every 6 (six) hours as needed. For pain       . metFORMIN (GLUCOPHAGE) 1000 MG tablet Take 1,000 mg by mouth 2 (two) times daily with a meal.      . Multiple Vitamin (MULITIVITAMIN WITH MINERALS) TABS Take 1 tablet by mouth daily.      . rosuvastatin (CRESTOR) 20 MG tablet Take 20 mg by mouth daily. AT NIGHT      . telmisartan-hydrochlorothiazide (MICARDIS HCT) 80-12.5 MG per tablet Take 1 tablet by mouth daily with breakfast.         Results for orders placed during the hospital encounter of 04/10/12 (from the past 48 hour(s))  GLUCOSE, CAPILLARY     Status: Abnormal   Collection Time   04/10/12  8:01 AM      Component Value Range Comment   Glucose-Capillary 144 (*) 70 - 99 (mg/dL)    No results found.  Review of Systems  Constitutional: Negative.   HENT: Negative.   Eyes: Negative.   Respiratory: Negative.   Cardiovascular: Negative.   Gastrointestinal: Negative.   Genitourinary: Negative.   Musculoskeletal: Negative.   Skin: Negative.   Neurological: Negative.   Endo/Heme/Allergies: Negative.   Psychiatric/Behavioral: Negative.     Blood pressure 104/61, pulse 96, temperature 97.9 F (36.6 C),  temperature source Oral, resp. rate 18, SpO2 99.00%. Physical Exam  Constitutional: She is oriented to person, place, and time. She appears well-developed and well-nourished.  HENT:  Head: Normocephalic and atraumatic.  Right Ear: External ear normal.  Left Ear: External ear normal.  Nose: Nose normal.  Mouth/Throat: Oropharynx is clear and moist.  Eyes: Conjunctivae and EOM are normal. Pupils are equal, round, and reactive to light. No scleral icterus.  Neck: Normal range of motion. Neck supple. No tracheal deviation present. No thyromegaly present.  Cardiovascular: Normal rate, regular rhythm, normal heart sounds and intact distal pulses.  Exam reveals no  gallop.   No murmur heard. Respiratory: Effort normal and breath sounds normal. No respiratory distress. She has no wheezes. She has no rales.  GI: Soft. Bowel sounds are normal. She exhibits no distension and no mass. There is no tenderness. There is no rebound and no guarding.       Perc drain in RUQ, site clear  Musculoskeletal: Normal range of motion. She exhibits no edema and no tenderness.  Lymphadenopathy:    She has no cervical adenopathy.  Neurological: She is alert and oriented to person, place, and time. She has normal reflexes.  Skin: Skin is warm and dry. She is not diaphoretic.  Psychiatric: She has a normal mood and affect. Her behavior is normal. Judgment and thought content normal.     Assessment/Plan Acute cholecystitis with colonic fistula, abscess  - plan exploratory laparotomy with cholecystectomy, possible right colectomy, and removal of perc drain catheter  The risks and benefits of the procedure have been discussed at length with the patient.  The patient understands the proposed procedure, potential alternative treatments, and the course of recovery to be expected.  All of the patient's questions have been answered at this time.  The patient wishes to proceed with surgery.  Velora Heckler, MD, [facility] Office: [phone]     Ronnald Shedden M 04/10/2012, 10:07 AM

## 2012-04-10 NOTE — Anesthesia Postprocedure Evaluation (Signed)
  Anesthesia Post-op Note  Patient: Kelly Farmer  Procedure(s) Performed: Procedure(s) (LRB): EXPLORATORY LAPAROTOMY () COLON RESECTION RIGHT () CHOLECYSTECTOMY ()  Patient Location: PACU  Anesthesia Type: General  Level of Consciousness: awake and alert   Airway and Oxygen Therapy: Patient Spontanous Breathing  Post-op Pain: mild  Post-op Assessment: Post-op Vital signs reviewed, Patient's Cardiovascular Status Stable, Respiratory Function Stable, Patent Airway and No signs of Nausea or vomiting  Post-op Vital Signs: stable  Complications: No apparent anesthesia complications

## 2012-04-10 NOTE — Anesthesia Preprocedure Evaluation (Signed)
Anesthesia Evaluation  Patient identified by MRN, date of birth, ID band Patient awake    Reviewed: Allergy & Precautions, H&P , NPO status , Patient's Chart, lab work & pertinent test results  Airway Mallampati: II TM Distance: >3 FB Neck ROM: full    Dental  (+) Edentulous Upper and Edentulous Lower   Pulmonary neg pulmonary ROS,  breath sounds clear to auscultation  Pulmonary exam normal       Cardiovascular Exercise Tolerance: Good hypertension, Pt. on medications negative cardio ROS  Rhythm:regular Rate:Normal     Neuro/Psych negative neurological ROS  negative psych ROS   GI/Hepatic negative GI ROS, Neg liver ROS, GERD-  Medicated and Controlled,  Endo/Other  negative endocrine ROSDiabetes mellitus-, Well Controlled, Type 2, Oral Hypoglycemic Agents  Renal/GU negative Renal ROS  negative genitourinary   Musculoskeletal   Abdominal   Peds  Hematology negative hematology ROS (+)   Anesthesia Other Findings   Reproductive/Obstetrics negative OB ROS                           Anesthesia Physical Anesthesia Plan  ASA: III  Anesthesia Plan: General   Post-op Pain Management:    Induction: Intravenous  Airway Management Planned: Oral ETT  Additional Equipment:   Intra-op Plan:   Post-operative Plan: Extubation in OR  Informed Consent: I have reviewed the patients History and Physical, chart, labs and discussed the procedure including the risks, benefits and alternatives for the proposed anesthesia with the patient or authorized representative who has indicated his/her understanding and acceptance.   Dental Advisory Given  Plan Discussed with: CRNA and Surgeon  Anesthesia Plan Comments:         Anesthesia Quick Evaluation

## 2012-04-10 NOTE — Transfer of Care (Signed)
Immediate Anesthesia Transfer of Care Note  Patient: Kelly Farmer  Procedure(s) Performed: Procedure(s) (LRB): EXPLORATORY LAPAROTOMY () COLON RESECTION RIGHT () CHOLECYSTECTOMY ()  Patient Location: PACU  Anesthesia Type: General  Level of Consciousness: alert , oriented and patient cooperative  Airway & Oxygen Therapy: Patient Spontanous Breathing and Patient connected to face mask oxygen  Post-op Assessment: Report given to PACU RN, Post -op Vital signs reviewed and stable and Patient moving all extremities  Post vital signs: Reviewed and stable  Complications: No apparent anesthesia complications

## 2012-04-10 NOTE — Progress Notes (Signed)
UR complete 

## 2012-04-11 DIAGNOSIS — E119 Type 2 diabetes mellitus without complications: Secondary | ICD-10-CM

## 2012-04-11 DIAGNOSIS — K823 Fistula of gallbladder: Secondary | ICD-10-CM | POA: Diagnosis present

## 2012-04-11 LAB — GLUCOSE, CAPILLARY
Glucose-Capillary: 224 mg/dL — ABNORMAL HIGH (ref 70–99)
Glucose-Capillary: 145 mg/dL — ABNORMAL HIGH (ref 70–99)
Glucose-Capillary: 186 mg/dL — ABNORMAL HIGH (ref 70–99)

## 2012-04-11 LAB — CBC
HCT: 33.6 % — ABNORMAL LOW (ref 36.0–46.0)
Hemoglobin: 10.8 g/dL — ABNORMAL LOW (ref 12.0–15.0)
MCH: 26.1 pg (ref 26.0–34.0)
MCHC: 32.1 g/dL (ref 30.0–36.0)
MCV: 81.2 fL (ref 78.0–100.0)
Platelets: 245 10*3/uL (ref 150–400)
RBC: 4.14 MIL/uL (ref 3.87–5.11)
RDW: 16.8 % — ABNORMAL HIGH (ref 11.5–15.5)
WBC: 11.8 10*3/uL — ABNORMAL HIGH (ref 4.0–10.5)

## 2012-04-11 LAB — BASIC METABOLIC PANEL
BUN: 4 mg/dL — ABNORMAL LOW (ref 6–23)
CO2: 22 mEq/L (ref 19–32)
Calcium: 8.4 mg/dL (ref 8.4–10.5)
Chloride: 105 mEq/L (ref 96–112)
Creatinine, Ser: 0.53 mg/dL (ref 0.50–1.10)
GFR calc Af Amer: >90 mL/min (ref >90)
GFR calc non Af Amer: >90 mL/min (ref >90)
Glucose, Bld: 257 mg/dL — ABNORMAL HIGH (ref 70–99)
Potassium: 4 mEq/L (ref 3.5–5.1)
Sodium: 136 mEq/L (ref 135–145)

## 2012-04-11 MED ORDER — LACTATED RINGERS IV BOLUS (SEPSIS): 1000.0000 mL | Freq: Three times a day (TID) | INTRAVENOUS | Status: AC | PRN

## 2012-04-11 MED ORDER — SACCHAROMYCES BOULARDII 250 MG PO CAPS
250.0000 mg | ORAL_CAPSULE | Freq: Two times a day (BID) | ORAL | Status: DC
  Administered 2012-04-11 – 2012-04-12 (×4): 250 mg via ORAL
  Filled 2012-04-11 (×6): qty 1

## 2012-04-11 MED ORDER — ADULT MULTIVITAMIN W/MINERALS CH
1.0000 | ORAL_TABLET | Freq: Every day | ORAL | Status: DC
  Administered 2012-04-11 – 2012-04-12 (×2): 1 via ORAL
  Filled 2012-04-11 (×3): qty 1

## 2012-04-11 MED ORDER — MORPHINE SULFATE 2 MG/ML IJ SOLN: 2.0000 mg | INTRAMUSCULAR | Status: DC | PRN

## 2012-04-11 MED ORDER — PSYLLIUM 95 % PO PACK
1.0000 | PACK | Freq: Two times a day (BID) | ORAL | Status: DC
  Administered 2012-04-11 – 2012-04-12 (×3): 1 via ORAL
  Filled 2012-04-11 (×4): qty 1

## 2012-04-11 MED ORDER — LIP MEDEX EX OINT
1.0000 "application " | TOPICAL_OINTMENT | Freq: Two times a day (BID) | CUTANEOUS | Status: DC
  Administered 2012-04-11 – 2012-04-20 (×16): 1 via TOPICAL
  Filled 2012-04-11 (×3): qty 7

## 2012-04-11 MED ORDER — METFORMIN HCL 500 MG PO TABS
500.0000 mg | ORAL_TABLET | Freq: Every day | ORAL | Status: DC
  Administered 2012-04-15 – 2012-04-20 (×6): 500 mg via ORAL
  Filled 2012-04-11 (×11): qty 1

## 2012-04-11 MED ORDER — INSULIN ASPART 100 UNIT/ML ~~LOC~~ SOLN
0.0000 [IU] | Freq: Three times a day (TID) | SUBCUTANEOUS | Status: DC
  Administered 2012-04-11: 5 [IU] via SUBCUTANEOUS
  Administered 2012-04-11: 2 [IU] via SUBCUTANEOUS
  Administered 2012-04-12: 5 [IU] via SUBCUTANEOUS
  Administered 2012-04-12 (×2): 3 [IU] via SUBCUTANEOUS
  Administered 2012-04-13: 5 [IU] via SUBCUTANEOUS
  Administered 2012-04-13 (×2): 8 [IU] via SUBCUTANEOUS
  Administered 2012-04-14 (×2): 5 [IU] via SUBCUTANEOUS
  Administered 2012-04-15: 3 [IU] via SUBCUTANEOUS
  Administered 2012-04-15: 5 [IU] via SUBCUTANEOUS
  Administered 2012-04-15: 2 [IU] via SUBCUTANEOUS
  Administered 2012-04-16: 3 [IU] via SUBCUTANEOUS
  Administered 2012-04-17: 2 [IU] via SUBCUTANEOUS
  Administered 2012-04-17: 3 [IU] via SUBCUTANEOUS
  Administered 2012-04-18 – 2012-04-19 (×2): 2 [IU] via SUBCUTANEOUS
  Administered 2012-04-20: 3 [IU] via SUBCUTANEOUS

## 2012-04-11 MED ORDER — MAGIC MOUTHWASH
15.0000 mL | Freq: Four times a day (QID) | ORAL | Status: DC | PRN
  Administered 2012-04-14: 15 mL via ORAL
  Filled 2012-04-11 (×2): qty 15

## 2012-04-11 MED ORDER — ACETAMINOPHEN 325 MG PO TABS
650.0000 mg | ORAL_TABLET | Freq: Four times a day (QID) | ORAL | Status: DC
  Administered 2012-04-11 – 2012-04-18 (×18): 650 mg via ORAL
  Filled 2012-04-11 (×37): qty 2

## 2012-04-11 MED ORDER — INSULIN ASPART 100 UNIT/ML ~~LOC~~ SOLN: 0.0000 [IU] | Freq: Every day | SUBCUTANEOUS | Status: DC

## 2012-04-11 MED ORDER — METFORMIN HCL 500 MG PO TABS: 500.0000 mg | ORAL_TABLET | Freq: Every day | ORAL | Status: DC

## 2012-04-11 NOTE — Progress Notes (Signed)
Spoke to Dr. Michaell Cowing on floor about patient on metformin and glucose on am bmet was 257

## 2012-04-11 NOTE — Op Note (Signed)
Kelly Farmer, ROZZELL            ACCOUNT NO.:  [id number]  MEDICAL RECORD NO.:  [id number]  LOCATION:  1529                         FACILITY:  [facility]  PHYSICIAN:  Velora Heckler, MD      DATE OF BIRTH:  07-19-1941  DATE OF PROCEDURE: 10 April 2012                               OPERATIVE REPORT   PREOPERATIVE DIAGNOSIS:  History of empyema of the gallbladder, cholecystocolonic fistula.  POSTOPERATIVE DIAGNOSIS:  History of empyema of the gallbladder, cholecystocolonic fistula.  PROCEDURE: 1. Exploratory laparotomy. 2. Right colectomy. 3. Cholecystectomy.  SURGEON:  Velora Heckler, MD, FACS  ASSISTANT:  Manus Rudd, MD, FACS  ANESTHESIA:  General per Dr. Ronelle Nigh.  ESTIMATED BLOOD LOSS:  150 cc.  PREPARATION:  ChloraPrep.  COMPLICATIONS:  None.  INDICATIONS:  The patient is a 71 year old black female admitted to the General Surgery Service at [facility] in April, 2013 with acute abdominal pain, leukocytosis, and findings consistent with an acute inflammatory process in the right upper quadrant of the abdomen. The patient underwent percutaneous transhepatic drainage of the gallbladder with 180 cc of purulent fluid drained from the gallbladder. The patient was treated with antibiotics over a prolonged course and had gradual resolution and return to normal.  Drain remained in place till the time of surgery.  Drain injection in April, 2013 showed a communication between the gallbladder and the hepatic flexure of the colon consistent with a cholecystocolonic fistula.  Upper endoscopy was performed and showed no sign of malignancy.  Prior to surgery a Gastrografin enema was obtained, which showed a normal appearing colon with mild diverticular disease and no evidence of persistent fistula. The patient now comes to surgery for exploratory laparotomy, colectomy, and cholecystectomy.  OPERATIVE REPORT:  Procedure was done in OR #1 at the [facility]  The patient was brought to the operating room, placed in supine position on the operating room table.  Following administration of general anesthesia, the patient was prepped and draped in usual strict aseptic fashion.  After ascertaining that an adequate level of anesthesia been achieved, a midline abdominal incision was made with #10 blade.  Dissection was carried through subcutaneous tissues. Fascia was incised in the midline and the peritoneal cavity was entered cautiously.  Abdomen was explored.  There were omental adhesions to the previous low midline surgical incision.  Adhesions were taken down with the electrocautery and with the Ethicon Enseal.  Bookwalter retractor was then placed for exposure.  Abdomen was explored.  The appendix appears to be surgically absent.  There were some adhesions in the right lower quadrant.  Adhesions were lysed with the electrocautery and the right colon was mobilized.  At the liver there were adhesions of the anterior surface of the liver to the anterior abdominal wall.  There were adhesions of the colon and the omentum to the undersurface of the liver.  These were gently mobilized.  The fundus of the gallbladder was identified.  A point in the mid transverse colon was selected and transected with GIA stapler.  Mesocolon was divided with the Ethicon Enseal.  Colon was mobilized completely up to the hepatic flexure, where there was a point where  the colon was densely adherent to the undersurface of the gallbladder.  The two organs were then sharply separated with dissection with the Metzenbaum scissors.  There was clearly a fistulous connection between the hepatic flexure of the colon and the gallbladder.  The fistula was transected and the remaining mesentery was divided with the Ethicon Enseal and the colon was removed from the peritoneal cavity and submitted as specimen.  Suture was used to mark the point of fistula  formation.  Next, we explored the undersurface of the liver and began dissection of the gallbladder.  This was extremely difficult, as the gallbladder was small, contracted, and scarred to the undersurface of the gallbladder and involved the distal antrum and first portion of the duodenum.  The gallbladder was opened and the pigtail catheter drain was excised and removed.  Gallbladder was then dissected away from the antrum and first portion of the duodenum with careful sharp dissection.  The antrum and duodenum were completely separated from the undersurface of the gallbladder.  The posterior gallbladder wall was cauterized on the surface of the liver.  Fundus of the gallbladder was excised and submitted as specimen.  The proximal gallbladder was then dissected out down to the neck of the gallbladder.  Due to dense inflammatory desmoplastic type changes in the portal hilum, a decision was made not to dissect out the entire gallbladder and cystic duct due to fear of injury to the common bile duct and other structures.  The cystic artery was identified and suture ligated with a 3-0 silk figure-of-eight suture. The proximal gallbladder was dissected out down to the origin of the cystic duct and then the gallbladder was closed just immediately distal to the orifice of the cystic duct with a TA 30 stapler.  Remaining gallbladder was excised off the staple line and submitted to Pathology for review.  Good occlusion of the proximal gallbladder was noted.  Abdomen was irrigated with warm saline.  Good hemostasis was achieved.  Next, a side-to-side functionally end-to-end anastomosis was created between the terminal ileum and the mid transverse colon.  This was performed with a GIA 75 stapler.  The staple line was inspected for hemostasis.  Enterotomy was closed with a TA 60 stapler.  Mesenteric defect was closed with interrupted 2-0 and 3-0 silk sutures.  The anastomosis appears to be widely  patent and intact.  Peritoneal cavity was irrigated copiously with warm saline which was evacuated.  Good hemostasis was noted.  A 19-French Blake drain was brought in through a stab wound in the right upper quadrant and placed beneath the liver and immediately adjacent to the closure on the distal gallbladder.  It was secured to the skin with 3-0 nylon suture and placed to bulb suction.  Omentum was used to cover the anastomosis.  The midline fascia incision was closed with interrupted #1 Novafil simple sutures.  Subcutaneous tissues were irrigated.  Skin was closed with stainless steel staples.  Wounds were washed and dried.  Sterile dressings were applied.  The right upper quadrant percutaneous pigtail catheter drain which remains at the skin was removed in its entirety. Bandage was placed over the wound. The patient was awakened from anesthesia and brought to the recovery room.  The patient tolerated the procedure well.  Velora Heckler, MD, [facility] Office: [phone]  TMG/MEDQ  D:  04/10/2012  T:  04/11/2012  Job:  [id number]

## 2012-04-11 NOTE — Progress Notes (Signed)
Kelly Farmer [id number] [birth year]  CARE TEAM:  PCP: Sissy Hoff, MD, MD  Outpatient Care Team: Patient Care Team: Sissy Hoff, MD as PCP - General (Family Medicine)  Inpatient Treatment Team: Treatment Team: Attending Provider: Velora Heckler, MD; Registered Nurse: Kermit Balo Robbs, RN; Technician: Mal Misty, NT; Technician: Burnard Bunting, [place]; Registered Nurse: Horton Marshall  Subjective:  Sore, PCA helps Been up chair Worried about Glc/DM  Objective:  Vital signs:  Filed Vitals:   04/11/12 0400 04/11/12 0556 04/11/12 0746 04/11/12 1044  BP:  135/80  129/75  Pulse:  96  86  Temp:  98.8 F (37.1 C)  97.6 F (36.4 C)  TempSrc:  Oral  Oral  Resp: 18 20 22 23   Height:      Weight:      SpO2: 100% 100% 100% 100%    Last BM Date: 04/10/12  Intake/Output   Yesterday:  06/07 0701 - 06/08 0700 In: 5430 [I.V.:5340] Out: 2690 [Urine:2250; Drains:240; Blood:200] This shift:  Total I/O In: -  Out: 350 [Urine:350]  Bowel function:  Flatus: n  BM: n  Drain: serosanguinous  Physical Exam:  General: Pt awake/alert/oriented x4 in no acute distress Eyes: PERRL, normal EOM.  Sclera clear.  No icterus Neuro: CN II-XII intact w/o focal sensory/motor deficits. Lymph: No head/neck/groin lymphadenopathy Psych:  No delerium/psychosis/paranoia HENT: Normocephalic, Mucus membranes moist.  No thrush Neck: Supple, No tracheal deviation Chest: No chest wall pain w good excursion CV:  Pulses intact.  Regular rhythm Abdomen: Soft.  Nondistended.  Mildly tender at incisions only.  No incarcerated hernias.   Ext:  SCDs BLE.  No mjr edema.  No cyanosis Skin: No petechiae / purpurae  Results:   Labs: Results for orders placed during the hospital encounter of 04/10/12 (from the past 48 hour(s))  GLUCOSE, CAPILLARY     Status: Abnormal   Collection Time   04/10/12  8:01 AM      Component Value Range Comment   Glucose-Capillary 144 (*) 70 - 99 (mg/dL)     GLUCOSE, CAPILLARY     Status: Abnormal   Collection Time   04/10/12  1:39 PM      Component Value Range Comment   Glucose-Capillary 194 (*) 70 - 99 (mg/dL)    Comment 1 Documented in Chart      Comment 2 Notify RN     BASIC METABOLIC PANEL     Status: Abnormal   Collection Time   04/11/12  4:57 AM      Component Value Range Comment   Sodium 136  135 - 145 (mEq/L)    Potassium 4.0  3.5 - 5.1 (mEq/L)    Chloride 105  96 - 112 (mEq/L)    CO2 22  19 - 32 (mEq/L)    Glucose, Bld 257 (*) 70 - 99 (mg/dL)    BUN 4 (*) 6 - 23 (mg/dL)    Creatinine, Ser 4.09  0.50 - 1.10 (mg/dL)    Calcium 8.4  8.4 - 10.5 (mg/dL)    GFR calc non Af Amer >90  >90 (mL/min)    GFR calc Af Amer >90  >90 (mL/min)   CBC     Status: Abnormal   Collection Time   04/11/12  4:57 AM      Component Value Range Comment   WBC 11.8 (*) 4.0 - 10.5 (K/uL)    RBC 4.14  3.87 - 5.11 (MIL/uL)    Hemoglobin 10.8 (*) 12.0 - 15.0 (  g/dL)    HCT 78.2 (*) 95.6 - 46.0 (%)    MCV 81.2  78.0 - 100.0 (fL)    MCH 26.1  26.0 - 34.0 (pg)    MCHC 32.1  30.0 - 36.0 (g/dL)    RDW 21.3 (*) 08.6 - 15.5 (%)    Platelets 245  150 - 400 (K/uL)     Imaging / Studies: No results found.  Medications / Allergies: per chart  Antibiotics: Anti-infectives     Start     Dose/Rate Route Frequency Ordered Stop   04/10/12 0735   ertapenem (INVANZ) 1 g in sodium chloride 0.9 % 50 mL IVPB        1 g 100 mL/hr over 30 Minutes Intravenous 60 min pre-op 04/10/12 0735 04/10/12 1001          Problem List:  Principal Problem:  *Cholecystocolonic fistula Active Problems:  Right upper quadrant abdominal abscess  Diabetes mellitus type 2, noninsulin dependent  Hypertension   Assessment  Lurline Del  71 y.o. female  1 Day Post-Op  Procedure(s): EXPLORATORY LAPAROTOMY COLON RESECTION RIGHT CHOLECYSTECTOMY  Recovering slowly  Plan:  -adv diet slowly -improve DM control with low PO & SSI -BP OK -VTE prophylaxis- SCDs,  etc -mobilize as tolerated to help recovery  Ardeth Sportsman, M.D., F.A.C.S. Gastrointestinal and Minimally Invasive Surgery [facility] [address], [address] [place], [place] [address] [phone] Main / Paging [phone] Voice Mail   04/11/2012

## 2012-04-12 LAB — BASIC METABOLIC PANEL
BUN: 3 mg/dL — ABNORMAL LOW (ref 6–23)
CO2: 24 mEq/L (ref 19–32)
Calcium: 9 mg/dL (ref 8.4–10.5)
Chloride: 95 mEq/L — ABNORMAL LOW (ref 96–112)
Creatinine, Ser: 0.5 mg/dL (ref 0.50–1.10)
GFR calc Af Amer: >90 mL/min (ref >90)
GFR calc non Af Amer: >90 mL/min (ref >90)
Glucose, Bld: 192 mg/dL — ABNORMAL HIGH (ref 70–99)
Potassium: 3.6 mEq/L (ref 3.5–5.1)
Sodium: 131 mEq/L — ABNORMAL LOW (ref 135–145)

## 2012-04-12 LAB — HEMOGLOBIN A1C
Hgb A1c MFr Bld: 6.9 % — ABNORMAL HIGH
Mean Plasma Glucose: 151 mg/dL — ABNORMAL HIGH

## 2012-04-12 LAB — GLUCOSE, CAPILLARY
Glucose-Capillary: 229 mg/dL — ABNORMAL HIGH (ref 70–99)
Glucose-Capillary: 193 mg/dL — ABNORMAL HIGH (ref 70–99)
Glucose-Capillary: 169 mg/dL — ABNORMAL HIGH (ref 70–99)
Glucose-Capillary: 184 mg/dL — ABNORMAL HIGH (ref 70–99)

## 2012-04-12 NOTE — Progress Notes (Signed)
2 Days Post-Op  Subjective: Feels ok, but had some nausea during the night and vomited x1 pain control is good. Not passed gas or had a BM.   Objective: Vital signs in last 24 hours: Temp:  [97.9 F (36.6 C)-99.3 F (37.4 C)] 97.9 F (36.6 C) (06/09 0900) Pulse Rate:  [87-98] 89  (06/09 1039) Resp:  [17-24] 18  (06/09 0900) BP: (117-150)/(56-75) 117/70 mmHg (06/09 1039) SpO2:  [97 %-100 %] 99 % (06/09 0900)   Intake/Output from previous day: 06/08 0701 - 06/09 0700 In: 3285.4 [P.O.:1140; I.V.:2145.4] Out: 3560 [Urine:3500; Drains:60] Intake/Output this shift: Total I/O In: 380 [P.O.:80; I.V.:300] Out: 275 [Urine:275]   General appearance: alert, cooperative and no distress Resp: clear to auscultation bilaterally GI: Slightly distended but soft. Some incisional tenderness. No BS yet,  Incision: dry - JP thin.  Lab Results:   [facility] 04/11/12 0457  WBC 11.8*  HGB 10.8*  HCT 33.6*  PLT 245   BMET  Basename 04/11/12 0457  NA 136  K 4.0  CL 105  CO2 22  GLUCOSE 257*  BUN 4*  CREATININE 0.53  CALCIUM 8.4   PT/INR No results found for this basename: LABPROT:2,INR:2 in the last 72 hours ABG No results found for this basename: PHART:2,PCO2:2,PO2:2,HCO3:2 in the last 72 hours  MEDS, Scheduled    . acetaminophen  650 mg Oral QID  . irbesartan  300 mg Oral Daily   And  . hydrochlorothiazide  12.5 mg Oral Daily  . insulin aspart  0-15 Units Subcutaneous TID WC  . insulin aspart  0-5 Units Subcutaneous QHS  . lip balm  1 application Topical BID  . metFORMIN  500 mg Oral Q breakfast  . morphine   Intravenous Q4H  . multivitamin with minerals  1 tablet Oral Daily  . psyllium  1 packet Oral BID  . saccharomyces boulardii  250 mg Oral BID    Studies/Results: No results found.  Assessment: s/p Procedure(s): EXPLORATORY LAPAROTOMY COLON RESECTION RIGHT CHOLECYSTECTOMY Stable 2 days post op  Plan: Not ready to advance diet. Encouraged ambulation. Check  labs in am   LOS: 2 days     Currie Paris, MD, [facility] [place] [phone]   04/12/2012 12:42 PM

## 2012-04-13 LAB — GLUCOSE, CAPILLARY
Glucose-Capillary: 254 mg/dL — ABNORMAL HIGH (ref 70–99)
Glucose-Capillary: 247 mg/dL — ABNORMAL HIGH (ref 70–99)
Glucose-Capillary: 265 mg/dL — ABNORMAL HIGH (ref 70–99)
Glucose-Capillary: 190 mg/dL — ABNORMAL HIGH (ref 70–99)

## 2012-04-13 LAB — CBC
HCT: 32.6 % — ABNORMAL LOW (ref 36.0–46.0)
Hemoglobin: 10.5 g/dL — ABNORMAL LOW (ref 12.0–15.0)
MCH: 26.1 pg (ref 26.0–34.0)
MCHC: 32.2 g/dL (ref 30.0–36.0)
MCV: 81.1 fL (ref 78.0–100.0)
Platelets: 249 10*3/uL (ref 150–400)
RBC: 4.02 MIL/uL (ref 3.87–5.11)
RDW: 16.5 % — ABNORMAL HIGH (ref 11.5–15.5)
WBC: 10.8 10*3/uL — ABNORMAL HIGH (ref 4.0–10.5)

## 2012-04-13 MED ORDER — SODIUM CHLORIDE 0.9 % IJ SOLN: 9.0000 mL | INTRAMUSCULAR | Status: DC | PRN

## 2012-04-13 MED ORDER — ONDANSETRON HCL 4 MG/2ML IJ SOLN: 4.0000 mg | Freq: Four times a day (QID) | INTRAMUSCULAR | Status: DC | PRN

## 2012-04-13 MED ORDER — HYDROMORPHONE 0.3 MG/ML IV SOLN
INTRAVENOUS | Status: DC
  Administered 2012-04-13: 0.6 mg via INTRAVENOUS
  Administered 2012-04-13: via INTRAVENOUS
  Administered 2012-04-13: 1.2 mg via INTRAVENOUS
  Administered 2012-04-13: 0.3 mg via INTRAVENOUS
  Administered 2012-04-14: 1.8 mg via INTRAVENOUS
  Administered 2012-04-14: 10:00:00 via INTRAVENOUS
  Administered 2012-04-14: 0.3 mg via INTRAVENOUS
  Administered 2012-04-14: 0.9 mg via INTRAVENOUS
  Administered 2012-04-14: 1.2 mg via INTRAVENOUS
  Administered 2012-04-14: 0.9 mg via INTRAVENOUS
  Administered 2012-04-15: 0.3 mg via INTRAVENOUS
  Administered 2012-04-15: 19:00:00 via INTRAVENOUS
  Administered 2012-04-15: 0.6 mg via INTRAVENOUS
  Administered 2012-04-15 (×2): 0.9 mg via INTRAVENOUS
  Administered 2012-04-15: 1.5 mg via INTRAVENOUS
  Administered 2012-04-16: 1.2 mg via INTRAVENOUS
  Administered 2012-04-16 (×2): 0.9 mg via INTRAVENOUS
  Administered 2012-04-16: 0.3 mg via INTRAVENOUS
  Administered 2012-04-16 – 2012-04-17 (×2): 0.6 mg via INTRAVENOUS
  Administered 2012-04-17: 1.8 mg via INTRAVENOUS
  Administered 2012-04-17: 1.5 mg via INTRAVENOUS
  Administered 2012-04-17: 0.9 mg via INTRAVENOUS
  Administered 2012-04-17: 19:00:00 via INTRAVENOUS
  Administered 2012-04-17: 0.6 mg via INTRAVENOUS
  Administered 2012-04-18: 1.42 mg via INTRAVENOUS
  Administered 2012-04-18: 0.6 mg via INTRAVENOUS
  Filled 2012-04-13 (×4): qty 25

## 2012-04-13 MED ORDER — NALOXONE HCL 0.4 MG/ML IJ SOLN: 0.4000 mg | INTRAMUSCULAR | Status: DC | PRN

## 2012-04-13 MED ORDER — PROMETHAZINE HCL 25 MG/ML IJ SOLN
12.5000 mg | Freq: Four times a day (QID) | INTRAMUSCULAR | Status: DC | PRN
  Administered 2012-04-13 (×2): 12.5 mg via INTRAVENOUS
  Administered 2012-04-13 (×2): 25 mg via INTRAVENOUS
  Administered 2012-04-14: 12.5 mg via INTRAVENOUS
  Administered 2012-04-14 (×2): 25 mg via INTRAVENOUS
  Administered 2012-04-15: 12.5 mg via INTRAVENOUS
  Administered 2012-04-15: 25 mg via INTRAVENOUS
  Administered 2012-04-15: 12.5 mg via INTRAVENOUS
  Filled 2012-04-13 (×10): qty 1

## 2012-04-13 MED ORDER — DIPHENHYDRAMINE HCL 50 MG/ML IJ SOLN: 12.5000 mg | Freq: Four times a day (QID) | INTRAMUSCULAR | Status: DC | PRN

## 2012-04-13 MED ORDER — DIPHENHYDRAMINE HCL 12.5 MG/5ML PO ELIX: 12.5000 mg | ORAL_SOLUTION | Freq: Four times a day (QID) | ORAL | Status: DC | PRN

## 2012-04-13 NOTE — Progress Notes (Signed)
On call surgeon paged. Notified about nausea, emesis and HR running between 120-130.

## 2012-04-13 NOTE — Progress Notes (Signed)
Patient ID: Kelly Farmer, female   DOB: December 06, 1940, 71 y.o.   MRN: [id number]  General Surgery - [facility] - Progress Note  POD# 3  Subjective: Patient up in chair.  Mild nausea, some emesis.  Clear liquid diet.  Has not ambulated today.  No flatus.  No BM.  Objective: Vital signs in last 24 hours: Temp:  [97.8 F (36.6 C)-98.9 F (37.2 C)] 98.9 F (37.2 C) (06/10 1000) Pulse Rate:  [83-106] 106  (06/10 1000) Resp:  [14-20] 20  (06/10 1000) BP: (117-133)/(56-77) 133/66 mmHg (06/10 1000) SpO2:  [96 %-100 %] 96 % (06/10 0800) Last BM Date: 04/10/12  Intake/Output from previous day: 06/09 0701 - 06/10 0700 In: 1280 [P.O.:80; I.V.:1200] Out: 3260 [Urine:2775; Emesis/NG output:440; Drains:45]  Exam: HEENT - clear, not icteric Neck - soft Chest - clear bilaterally Cor - RRR, no murmur Abd - soft, mild distension; quiet - no BS; drain with thin serosanguinous; dressings dry and intact Ext - no significant edema Neuro - grossly intact, no focal deficits  Lab Results:   Basename 04/13/12 0415 04/11/12 0457  WBC 10.8* 11.8*  HGB 10.5* 10.8*  HCT 32.6* 33.6*  PLT 249 245     Basename 04/12/12 1400 04/11/12 0457  NA 131* 136  K 3.6 4.0  CL 95* 105  CO2 24 22  GLUCOSE 192* 257*  BUN 3* 4*  CREATININE 0.50 0.53  CALCIUM 9.0 8.4    Studies/Results: No results found.  Assessment / Plan: 1.  Status post right colectomy and cholecystectomy  - monitor drain RUQ  - post op ileus - will make NPO except for sips of CL's  - OOB, ambulate with assist  Velora Heckler, MD, [facility] Office: [phone]  04/13/2012

## 2012-04-13 NOTE — Progress Notes (Signed)
Pt ambulated in the hall x 2. Still complaining of N & V. PCA managing pain.

## 2012-04-13 NOTE — Progress Notes (Signed)
PCA was switched to Dilaudid high dose per order and given phenergan IV. Pt is now feeling much better at this time. Will continue to monitor. Patsey Berthold

## 2012-04-13 NOTE — Progress Notes (Signed)
Pt having intermittent nausea/vomiting. Zofran was not effective. MD notified Orders given. Patsey Berthold

## 2012-04-14 ENCOUNTER — Encounter (HOSPITAL_COMMUNITY): Payer: Self-pay | Admitting: Surgery

## 2012-04-14 LAB — GLUCOSE, CAPILLARY
Glucose-Capillary: 205 mg/dL — ABNORMAL HIGH (ref 70–99)
Glucose-Capillary: 221 mg/dL — ABNORMAL HIGH (ref 70–99)
Glucose-Capillary: 104 mg/dL — ABNORMAL HIGH (ref 70–99)
Glucose-Capillary: 194 mg/dL — ABNORMAL HIGH (ref 70–99)

## 2012-04-14 MED ORDER — INSULIN GLARGINE 100 UNIT/ML ~~LOC~~ SOLN
5.0000 [IU] | Freq: Every day | SUBCUTANEOUS | Status: DC
  Administered 2012-04-14 – 2012-04-19 (×6): 5 [IU] via SUBCUTANEOUS

## 2012-04-14 NOTE — Progress Notes (Signed)
Inpatient Diabetes Program Recommendations  AACE/ADA: New Consensus Statement on Inpatient Glycemic Control (2009)  Target Ranges:  Prepandial:   less than 140 mg/dL      Peak postprandial:   less than 180 mg/dL (1-2 hours)      Critically ill patients:  140 - 180 mg/dL   Reason for Visit: Hyperglycemia  Results for Kelly, Farmer (MRN [id number]) as of 04/14/2012 10:32  Ref. Range 04/13/2012 08:05 04/13/2012 12:01 04/13/2012 17:24 04/13/2012 21:09 04/14/2012 07:46  Glucose-Capillary Latest Range: 70-99 mg/dL 782 (H) 956 (H) 213 (H) 190 (H) 205 (H)    Inpatient Diabetes Program Recommendations Insulin - Basal: Add sm dose basal insulin - Lantus 5 units QHS  Note: Will follow.

## 2012-04-14 NOTE — Progress Notes (Signed)
Patient ID: Kelly Farmer, female   DOB: 04-27-1941, 71 y.o.   MRN: [id number]  General Surgery - [facility] - Progress Note  POD# 4  Subjective: Patient in bed.  Ambulated.  Mild nausea.  Taking limited clear liquids.  Mild pain.  Objective: Vital signs in last 24 hours: Temp:  [98 F (36.7 C)-98.3 F (36.8 C)] 98 F (36.7 C) (06/11 1400) Pulse Rate:  [111-130] 112  (06/11 1400) Resp:  [12-22] 19  (06/11 1400) BP: (113-128)/(56-76) 121/66 mmHg (06/11 1400) SpO2:  [100 %] 100 % (06/11 1400) FiO2 (%):  [32 %-39 %] 32 % (06/11 0409) Last BM Date: 04/10/12  Intake/Output from previous day: 06/10 0701 - 06/11 0700 In: 2675 [P.O.:200; I.V.:2475] Out: 1890 [Urine:1150; Emesis/NG output:600; Drains:140]  Exam: HEENT - clear, not icteric Neck - soft Chest - clear bilaterally Cor - RRR, no murmur Abd - soft, dressings dry;  Thin serosanguinous in JP drain.  Few BS present. Ext - no significant edema Neuro - grossly intact, no focal deficits  Lab Results:   Basename 04/13/12 0415  WBC 10.8*  HGB 10.5*  HCT 32.6*  PLT 249     Basename 04/12/12 1400  NA 131*  K 3.6  CL 95*  CO2 24  GLUCOSE 192*  BUN 3*  CREATININE 0.50  CALCIUM 9.0    Studies/Results: No results found.  Assessment / Plan: 1.  Status post partial colectomy and cholecystectomy  - clear liquids as tolerated  - ambulate  - IVF, check lytes in AM  - monitor drain output  Velora Heckler, MD, [facility] Office: [phone]  04/14/2012

## 2012-04-14 NOTE — Progress Notes (Signed)
UR complete 

## 2012-04-15 LAB — GLUCOSE, CAPILLARY
Glucose-Capillary: 207 mg/dL — ABNORMAL HIGH (ref 70–99)
Glucose-Capillary: 148 mg/dL — ABNORMAL HIGH (ref 70–99)
Glucose-Capillary: 156 mg/dL — ABNORMAL HIGH (ref 70–99)
Glucose-Capillary: 118 mg/dL — ABNORMAL HIGH (ref 70–99)

## 2012-04-15 LAB — BASIC METABOLIC PANEL
BUN: 6 mg/dL (ref 6–23)
CO2: 29 mEq/L (ref 19–32)
Calcium: 8.3 mg/dL — ABNORMAL LOW (ref 8.4–10.5)
Chloride: 92 mEq/L — ABNORMAL LOW (ref 96–112)
Creatinine, Ser: 0.57 mg/dL (ref 0.50–1.10)
GFR calc Af Amer: >90 mL/min (ref >90)
GFR calc non Af Amer: >90 mL/min (ref >90)
Glucose, Bld: 193 mg/dL — ABNORMAL HIGH (ref 70–99)
Potassium: 3.4 mEq/L — ABNORMAL LOW (ref 3.5–5.1)
Sodium: 129 mEq/L — ABNORMAL LOW (ref 135–145)

## 2012-04-15 LAB — CBC
HCT: 28.6 % — ABNORMAL LOW (ref 36.0–46.0)
Hemoglobin: 9.4 g/dL — ABNORMAL LOW (ref 12.0–15.0)
MCH: 26.3 pg (ref 26.0–34.0)
MCHC: 32.9 g/dL (ref 30.0–36.0)
MCV: 79.9 fL (ref 78.0–100.0)
Platelets: 274 10*3/uL (ref 150–400)
RBC: 3.58 MIL/uL — ABNORMAL LOW (ref 3.87–5.11)
RDW: 16.5 % — ABNORMAL HIGH (ref 11.5–15.5)
WBC: 8.3 10*3/uL (ref 4.0–10.5)

## 2012-04-15 MED ORDER — KCL IN DEXTROSE-NACL 20-5-0.9 MEQ/L-%-% IV SOLN
INTRAVENOUS | Status: DC
  Administered 2012-04-15 – 2012-04-20 (×6): via INTRAVENOUS
  Filled 2012-04-15 (×8): qty 1000

## 2012-04-15 NOTE — Care Management Note (Signed)
    Page 1 of 1   04/15/2012     2:21:06 PM   CARE MANAGEMENT NOTE 04/15/2012  Patient:  Kelly Farmer, Kelly Farmer   Account Number:  [id number]  Date Initiated:  04/15/2012  Documentation initiated by:  Lorenda Ishihara  Subjective/Objective Assessment:   71 yo female admitted s/p colectomy, cholecystectomy. PTA lived at home alone, son involved. PCP is Tally Joe     Action/Plan:   Anticipated DC Date:  04/17/2012   Anticipated DC Plan:  HOME W HOME HEALTH SERVICES      DC Planning Services  CM consult      [facility] Choice  HOME HEALTH  Resumption Of Svcs/PTA Provider   Choice offered to / List presented to:  C-1 Patient        HH arranged  HH-1 RN      [facility] agency  [facility]   Status of service:  In process, will continue to follow Medicare Important Message given?   (If response is "NO", the following Medicare IM given date fields will be blank) Date Medicare IM given:   Date Additional Medicare IM given:    Discharge Disposition:    Per UR Regulation:  Reviewed for med. necessity/level of care/duration of stay  If discussed at Long Length of Stay Meetings, dates discussed:    Comments:  04-15-12 Lorenda Ishihara RN CM 1400 Spoke with patient at bedside, she is currently active with [facility] for nursing. Patient states she plans to return home at d/c, has supportive family. Wants to continue with [facility] if able.

## 2012-04-15 NOTE — Progress Notes (Signed)
Patient ID: Kelly Farmer, female   DOB: [birth year], 71 y.o.   MRN: [id number]  General Surgery - [facility] - Progress Note  POD# 5  Subjective: Patient with persistent nausea, some emesis.  Sips clear liquids.  Ambulated.  Objective: Vital signs in last 24 hours: Temp:  [98.2 F (36.8 C)-98.8 F (37.1 C)] 98.7 F (37.1 C) (06/12 1414) Pulse Rate:  [99-119] 119  (06/12 1414) Resp:  [14-24] 24  (06/12 1414) BP: (104-117)/(54-68) 113/54 mmHg (06/12 1414) SpO2:  [97 %-100 %] 98 % (06/12 1414) FiO2 (%):  [32 %] 32 % (06/11 2000) Last BM Date: 04/10/12  Intake/Output from previous day: 06/11 0701 - 06/12 0700 In: 2335.4 [P.O.:220; I.V.:2115.4] Out: 1340 [Urine:1000; Emesis/NG output:170; Drains:170]  Exam: HEENT - clear, not icteric Neck - soft Chest - clear bilaterally Cor - RRR, no murmur Abd - mild distension; wound clear and dry; no BS present; drain with thin serous output Ext - no significant edema Neuro - grossly intact, no focal deficits  Lab Results:   Basename 04/15/12 0415 04/13/12 0415  WBC 8.3 10.8*  HGB 9.4* 10.5*  HCT 28.6* 32.6*  PLT 274 249     Basename 04/15/12 0415  NA 129*  K 3.4*  CL 92*  CO2 29  GLUCOSE 193*  BUN 6  CREATININE 0.57  CALCIUM 8.3*    Studies/Results: No results found.  Assessment / Plan: 1.  Status post partial colectomy and cholecystectomy   - clear liquids as tolerated   - ambulate   - IVF - change to NS   - monitor drain output  - stool softener not appropriate - patient has significant post op ileus  - check AXR in AM 6/13  Velora Heckler, MD, [facility] Office: [phone]  04/15/2012

## 2012-04-15 NOTE — Progress Notes (Signed)
Addressed the following issues with Dr. Gerrit Friends.  Sodium dropped to 129 (today) from 131 (04/12/12) Patient needs stool softeners ordered due to no flatus present, no BM since 04/10/12, and brown emesis this morning. Currently there are no stool softeners ordered.   Dr. Gerrit Friends stated he will address these issues when he rounds on patient later today.  No other concerns at this time.  Barrie Lyme 1:51 PM 04/15/2012

## 2012-04-16 ENCOUNTER — Inpatient Hospital Stay (HOSPITAL_COMMUNITY): Payer: Medicare Other

## 2012-04-16 LAB — GLUCOSE, CAPILLARY
Glucose-Capillary: 119 mg/dL — ABNORMAL HIGH (ref 70–99)
Glucose-Capillary: 120 mg/dL — ABNORMAL HIGH (ref 70–99)
Glucose-Capillary: 121 mg/dL — ABNORMAL HIGH (ref 70–99)
Glucose-Capillary: 153 mg/dL — ABNORMAL HIGH (ref 70–99)
Glucose-Capillary: 160 mg/dL — ABNORMAL HIGH (ref 70–99)

## 2012-04-16 MED ORDER — ALUM & MAG HYDROXIDE-SIMETH 200-200-20 MG/5ML PO SUSP
30.0000 mL | ORAL | Status: DC | PRN
  Administered 2012-04-16: 30 mL via ORAL
  Filled 2012-04-16: qty 30

## 2012-04-16 NOTE — Progress Notes (Signed)
Patient ID: Ruthella Kirchman, female   DOB: [birth year], 71 y.o.   MRN: [id number]  General Surgery - [facility] - Progress Note  POD# 6  Subjective: Patient much improved.  Hungry.  No nausea.  BM x 2 reported by nurse.  Objective: Vital signs in last 24 hours: Temp:  [97.7 F (36.5 C)-98.9 F (37.2 C)] 98.1 F (36.7 C) 06-May-2023 1400) Pulse Rate:  [70-107] 91  05-06-2023 1400) Resp:  [17-20] 19  May 06, 2023 1526) BP: (113-134)/(50-77) 134/50 mmHg May 06, 2023 1400) SpO2:  [95 %-98 %] 98 % May 06, 2023 1526) Last BM Date: 2012-05-05  Intake/Output from previous day: 06/12 0701 - 05/06/2023 0700 In: 1077.3 [P.O.:190; I.V.:883.3; IV Piggyback:4] Out: 1410 [Urine:1250; Drains:160]  Exam: HEENT - clear, not icteric Neck - soft Chest - clear bilaterally Cor - RRR, no murmur Abd - softer, BS present, wound clear and dry, JP with small serous Ext - no significant edema Neuro - grossly intact, no focal deficits  Lab Results:   [facility] 04/15/12 0415  WBC 8.3  HGB 9.4*  HCT 28.6*  PLT 274     Basename 04/15/12 0415  NA 129*  K 3.4*  CL 92*  CO2 29  GLUCOSE 193*  BUN 6  CREATININE 0.57  CALCIUM 8.3*    Studies/Results: Dg Abd 2 Views  May 05, 2012  *RADIOLOGY REPORT*  Clinical Data: Post cholecystectomy and partial colectomy, persistent ileus, follow-up  ABDOMEN - 2 VIEW  Comparison: CT abdomen pelvis of 02/20/2012  Findings: Supine and erect views of the abdomen do show gaseous distention of small bowel loops with scattered air-fluid levels. Some colonic bowel gas is seen.  This may reflect ileus, but partial obstruction cannot be excluded.  A catheter overlies the right abdomen.  No free air is seen on the erect view.  The bones are osteopenic.  IMPRESSION: Persistent gaseous distention of both large and small bowel with air-fluid levels, most consistent with ileus.  Difficult to exclude partial obstruction.  Original Report Authenticated By: Juline Patch, M.D.    Assessment /  Plan: 1.  Status post partial colectomy and cholecystectomy   - full liquids as tolerated   - ambulate   - IVF with NS   - monitor drain output   Velora Heckler, MD, [facility] Office: [phone]  05-05-2012

## 2012-04-17 LAB — BASIC METABOLIC PANEL
BUN: 3 mg/dL — ABNORMAL LOW (ref 6–23)
CO2: 29 mEq/L (ref 19–32)
Calcium: 8.4 mg/dL (ref 8.4–10.5)
Chloride: 97 mEq/L (ref 96–112)
Creatinine, Ser: 0.52 mg/dL (ref 0.50–1.10)
GFR calc Af Amer: >90 mL/min (ref >90)
GFR calc non Af Amer: >90 mL/min (ref >90)
Glucose, Bld: 165 mg/dL — ABNORMAL HIGH (ref 70–99)
Potassium: 3.2 mEq/L — ABNORMAL LOW (ref 3.5–5.1)
Sodium: 132 mEq/L — ABNORMAL LOW (ref 135–145)

## 2012-04-17 LAB — GLUCOSE, CAPILLARY
Glucose-Capillary: 117 mg/dL — ABNORMAL HIGH (ref 70–99)
Glucose-Capillary: 135 mg/dL — ABNORMAL HIGH (ref 70–99)
Glucose-Capillary: 139 mg/dL — ABNORMAL HIGH (ref 70–99)
Glucose-Capillary: 142 mg/dL — ABNORMAL HIGH (ref 70–99)
Glucose-Capillary: 154 mg/dL — ABNORMAL HIGH (ref 70–99)
Glucose-Capillary: 163 mg/dL — ABNORMAL HIGH (ref 70–99)

## 2012-04-18 LAB — GLUCOSE, CAPILLARY
Glucose-Capillary: 103 mg/dL — ABNORMAL HIGH (ref 70–99)
Glucose-Capillary: 109 mg/dL — ABNORMAL HIGH (ref 70–99)
Glucose-Capillary: 138 mg/dL — ABNORMAL HIGH (ref 70–99)
Glucose-Capillary: 103 mg/dL — ABNORMAL HIGH (ref 70–99)
Glucose-Capillary: 168 mg/dL — ABNORMAL HIGH (ref 70–99)

## 2012-04-18 MED ORDER — HYDROCODONE-ACETAMINOPHEN 5-325 MG PO TABS
1.0000 | ORAL_TABLET | ORAL | Status: DC | PRN
  Administered 2012-04-18 – 2012-04-20 (×3): 1 via ORAL
  Administered 2012-04-20: 2 via ORAL
  Filled 2012-04-18: qty 1
  Filled 2012-04-18: qty 2
  Filled 2012-04-18: qty 1
  Filled 2012-04-18: qty 2

## 2012-04-18 NOTE — Progress Notes (Signed)
Patient ID: Kelly Farmer, female   DOB: 1941-06-30, 71 y.o.   MRN: [id number]  General Surgery - [facility] - Progress Note  POD# 8  Subjective: Patient tolerating FL diet.  Ambulating.  Had BM's.  Mild pain.  Objective: Vital signs in last 24 hours: Temp:  [97.7 F (36.5 C)-98.6 F (37 C)] 98.6 F (37 C) (06/15 0609) Pulse Rate:  [70-97] 70  (06/15 0609) Resp:  [14-19] 17  (06/15 0609) BP: (121-135)/(67-80) 127/80 mmHg (06/15 0609) SpO2:  [97 %-100 %] 99 % (06/15 0609) Last BM Date: 04/18/12  Intake/Output from previous day: 06/14 0701 - 06/15 0700 In: 1111.7 [P.O.:360; I.V.:751.7] Out: 1626 [Urine:1351; Drains:275]  Exam: HEENT - clear, not icteric Neck - soft Chest - clear bilaterally Cor - RRR, no murmur Abd - soft, wound clear and dry; JP with thin serous output Ext - no significant edema Neuro - grossly intact, no focal deficits  Lab Results:  No results found for this basename: WBC:2,HGB:2,HCT:2,PLT:2 in the last 72 hours   Basename 04/17/12 0433  NA 132*  K 3.2*  CL 97  CO2 29  GLUCOSE 165*  BUN 3*  CREATININE 0.52  CALCIUM 8.4    Studies/Results: Dg Abd 2 Views  2012-04-22  *RADIOLOGY REPORT*  Clinical Data: Post cholecystectomy and partial colectomy, persistent ileus, follow-up  ABDOMEN - 2 VIEW  Comparison: CT abdomen pelvis of 02/20/2012  Findings: Supine and erect views of the abdomen do show gaseous distention of small bowel loops with scattered air-fluid levels. Some colonic bowel gas is seen.  This may reflect ileus, but partial obstruction cannot be excluded.  A catheter overlies the right abdomen.  No free air is seen on the erect view.  The bones are osteopenic.  IMPRESSION: Persistent gaseous distention of both large and small bowel with air-fluid levels, most consistent with ileus.  Difficult to exclude partial obstruction.  Original Report Authenticated By: Juline Patch, M.D.    Assessment / Plan: 1.  Status post  partial colectomy and cholecystectomy   - monitor drain output   - advance to regular diet  - discontinue PCA - oral pain Rx  - drain out tomorrow if output remains benign  - home 1-2 days  Velora Heckler, MD, [facility] Office: [phone]  04/18/2012

## 2012-04-19 LAB — GLUCOSE, CAPILLARY
Glucose-Capillary: 150 mg/dL — ABNORMAL HIGH (ref 70–99)
Glucose-Capillary: 116 mg/dL — ABNORMAL HIGH (ref 70–99)
Glucose-Capillary: 110 mg/dL — ABNORMAL HIGH (ref 70–99)
Glucose-Capillary: 150 mg/dL — ABNORMAL HIGH (ref 70–99)

## 2012-04-19 MED ORDER — ACETAMINOPHEN 325 MG PO TABS
650.0000 mg | ORAL_TABLET | Freq: Four times a day (QID) | ORAL | Status: DC
  Administered 2012-04-19 – 2012-04-20 (×4): 650 mg via ORAL
  Filled 2012-04-19 (×4): qty 2

## 2012-04-19 NOTE — Progress Notes (Signed)
Patient ID: Kelly Farmer, female   DOB: 12-05-1940, 71 y.o.   MRN: [id number] [facility] Progress Note:   9 Days Post-Op  Subjective: Mental status is clear.  Has had intermittent pain but not too bad.  Hoping to go home tomorrow Objective: Vital signs in last 24 hours: Temp:  [97.9 F (36.6 C)-98.9 F (37.2 C)] 98 F (36.7 C) (06/16 0537) Pulse Rate:  [93-108] 93  (06/16 0537) Resp:  [16-18] 17  (06/16 0537) BP: (116-130)/(71-76) 116/71 mmHg (06/16 0537) SpO2:  [91 %-96 %] 91 % (06/16 0537)  Intake/Output from previous day: 06/15 0701 - 06/16 0700 In: 2227.5 [P.O.:1080; I.V.:1147.5] Out: 1285 [Urine:1125; Drains:160] Intake/Output this shift: Total I/O In: 240 [P.O.:240] Out: 250 [Urine:250]  Physical Exam: Work of breathing is  Normal.  Midline incision is OK.  JP drainage is serous ( no bile)  Lab Results:  Results for orders placed during the hospital encounter of 04/10/12 (from the past 48 hour(s))  GLUCOSE, CAPILLARY     Status: Abnormal   Collection Time   04/17/12 11:48 AM      Component Value Range Comment   Glucose-Capillary 135 (*) 70 - 99 mg/dL   GLUCOSE, CAPILLARY     Status: Abnormal   Collection Time   04/17/12  5:34 PM      Component Value Range Comment   Glucose-Capillary 117 (*) 70 - 99 mg/dL   GLUCOSE, CAPILLARY     Status: Abnormal   Collection Time   04/17/12  8:04 PM      Component Value Range Comment   Glucose-Capillary 139 (*) 70 - 99 mg/dL   GLUCOSE, CAPILLARY     Status: Abnormal   Collection Time   04/17/12 11:46 PM      Component Value Range Comment   Glucose-Capillary 142 (*) 70 - 99 mg/dL   GLUCOSE, CAPILLARY     Status: Abnormal   Collection Time   04/18/12  3:51 AM      Component Value Range Comment   Glucose-Capillary 103 (*) 70 - 99 mg/dL   GLUCOSE, CAPILLARY     Status: Abnormal   Collection Time   04/18/12  7:37 AM      Component Value Range Comment   Glucose-Capillary 109 (*) 70 - 99 mg/dL   GLUCOSE, CAPILLARY      Status: Abnormal   Collection Time   04/18/12 12:05 PM      Component Value Range Comment   Glucose-Capillary 138 (*) 70 - 99 mg/dL   GLUCOSE, CAPILLARY     Status: Abnormal   Collection Time   04/18/12  3:39 PM      Component Value Range Comment   Glucose-Capillary 103 (*) 70 - 99 mg/dL   GLUCOSE, CAPILLARY     Status: Abnormal   Collection Time   04/18/12 10:10 PM      Component Value Range Comment   Glucose-Capillary 168 (*) 70 - 99 mg/dL    Comment 1 Notify RN      Comment 2 Documented in Chart     GLUCOSE, CAPILLARY     Status: Abnormal   Collection Time   04/19/12  7:35 AM      Component Value Range Comment   Glucose-Capillary 150 (*) 70 - 99 mg/dL     Radiology/Results: No results found.  Anti-infectives: Anti-infectives     Start     Dose/Rate Route Frequency Ordered Stop   04/10/12 0735   ertapenem (INVANZ) 1 g in sodium  chloride 0.9 % 50 mL IVPB        1 g 100 mL/hr over 30 Minutes Intravenous 60 min pre-op 04/10/12 0735 04/10/12 1001          Assessment/Plan: Problem List: Patient Active Problem List  Diagnosis  . Right upper quadrant abdominal abscess  . Suppurative cholecystitis  . Diabetes mellitus type 2, noninsulin dependent  . Hypertension  . Dyslipidemia  . Cholecystocolonic fistula    Hopeful discharge tomorrow.  Probable removal of drain prior to discharge.   9 Days Post-Op    LOS: 9 days   Matt B. Daphine Deutscher, MD, [facility] [phone] beeper [phone]  04/19/2012 10:19 AM

## 2012-04-20 LAB — GLUCOSE, CAPILLARY
Glucose-Capillary: 116 mg/dL — ABNORMAL HIGH (ref 70–99)
Glucose-Capillary: 180 mg/dL — ABNORMAL HIGH (ref 70–99)

## 2012-04-20 MED ORDER — HYDROCODONE-ACETAMINOPHEN 5-325 MG PO TABS: 1.0000 | ORAL_TABLET | ORAL | Status: AC | PRN

## 2012-04-20 NOTE — Discharge Instructions (Signed)
[facility] Office: [phone]  OPEN ABDOMINAL SURGERY: POST OP INSTRUCTIONS  Always review your discharge instruction sheet given to you by the facility where your surgery was performed.  1. A prescription for pain medication may be given to you upon discharge.  Take your pain medication as prescribed, if needed.  If narcotic pain medicine is not needed, then you may take acetaminophen (Tylenol) or ibuprofen (Advil) as needed. 2. Take your usually prescribed medications unless otherwise directed. 3. If you need a refill on your pain medication, please contact your pharmacy. They will contact our office to request authorization.  Prescriptions will not be filled after 5pm or on weekends. 4. You should follow a light diet the first few days after arrival home, such as soup and crackers, unless your doctor has advised otherwise. A high-fiber, low fat diet can be resumed as tolerated.  Be sure to include lots of fluids daily.  5. Most patients will experience some swelling and bruising in the area of the incision. Ice packs will help. Swelling and bruising can take several days to resolve. 6. It is common to experience some constipation if taking pain medication after surgery.  Increasing fluid intake and taking a stool softener will usually help or prevent this problem from occurring.  A mild laxative (Milk of Magnesia or Miralax) should be taken according to package directions if there are no bowel movements after 48 hours. 7.  You may have steri-strips (small skin tapes) in place directly over the incision.  These strips should be left on the skin for 7-10 days.  If your surgeon used skin glue on the incision, you may shower in 24 hours.  The glue will flake off over the next 2-3 weeks.  Any sutures or staples will be removed at the office during your follow-up visit. You may find that a light gauze bandage over your incision may keep your staples from being rubbed or pulled. You may  shower and replace the bandage daily. 8. ACTIVITIES:  You may resume regular (light) daily activities beginning the next day--such as daily self-care, walking, climbing stairs--gradually increasing activities as tolerated.  You may have sexual intercourse when it is comfortable.  Refrain from any heavy lifting or straining until approved by your doctor.  You may drive when you no longer are taking prescription pain medication, you can comfortably wear a seatbelt, and you can safely maneuver your car and apply brakes. 9. You should see your doctor in the office for a follow-up appointment approximately two weeks after your surgery.  Make sure that you call for this appointment within a day or two after you arrive home to insure a convenient appointment time.  WHEN TO CALL YOUR DOCTOR: 1. Fever over 101.0 2. Inability to urinate 3. Nausea and/or vomiting 4. Extreme swelling or bruising 5. Continued bleeding from incision. 6. Increased pain, redness, or drainage from the incision. 7. Difficulty swallowing or breathing 8. Muscle cramping or spasms. 9. Numbness or tingling in hands or feet or around lips.  IF YOU HAVE DISABILITY OR FAMILY LEAVE FORMS, YOU MUST BRING THEM TO THE OFFICE FOR PROCESSING.  PLEASE DO NOT GIVE THEM TO YOUR DOCTOR.  The clinic staff is available to answer your questions during regular business hours.  Please don't hesitate to call and ask to speak to one of the nurses if you have concerns.  For further questions, please visit [email or web address]    

## 2012-04-20 NOTE — Discharge Summary (Signed)
Physician Discharge Summary [facility]  Patient ID: Kelly Farmer MRN: [id number] DOB/AGE: [birth year] 71 y.o.  Admit date: 04/10/2012 Discharge date: 04/20/2012  Admission Diagnoses:  Cholecystitis, cholelithiasis, colonic fistula  Discharge Diagnoses:  Principal Problem:  *Cholecystocolonic fistula Active Problems:  Right upper quadrant abdominal abscess  Diabetes mellitus type 2, noninsulin dependent  Hypertension   Discharged Condition: good  Hospital Course: patient admitted to surgical service after exploratory laparotomy with open cholecystectomy and right colectomy.  Slow resolution of ileus.  Diet slowly advanced.  Activity increased to ambulation.  Drain and staples removed POD#10.  Prepared for discharge.  Consults: None  Significant Diagnostic Studies: none  Treatments: IV hydration  Discharge Exam: Blood pressure 130/50, pulse 90, temperature 98.6 F (37 C), temperature source Oral, resp. rate 20, height 5' (1.524 m), weight 161 lb (73.029 kg), SpO2 98.00%. HEENT - clear Neck - soft Chest - clear bilat Cor - RRR Abd - soft, mild distension; BS present; BM's formed; wound clear and dry - staples out; RUQ drain witth thin serous - removed Ext - no significant edema  Disposition: Home with family  Discharge Orders    Future Orders Please Complete By Expires   Diet - low sodium heart healthy      Increase activity slowly      Discharge instructions      Comments:   [facility] Office: [phone]  OPEN ABDOMINAL SURGERY: POST OP INSTRUCTIONS  Always review your discharge instruction sheet given to you by the facility where your surgery was performed.  A prescription for pain medication may be given to you upon discharge.  Take your pain medication as prescribed, if needed.  If narcotic pain medicine is not needed, then you may take acetaminophen (Tylenol) or ibuprofen (Advil) as needed. Take your usually prescribed  medications unless otherwise directed. If you need a refill on your pain medication, please contact your pharmacy. They will contact our office to request authorization.  Prescriptions will not be filled after 5pm or on weekends. You should follow a light diet the first few days after arrival home, such as soup and crackers, unless your doctor has advised otherwise. A high-fiber, low fat diet can be resumed as tolerated.  Be sure to include lots of fluids daily.  Most patients will experience some swelling and bruising in the area of the incision. Ice packs will help. Swelling and bruising can take several days to resolve. It is common to experience some constipation if taking pain medication after surgery.  Increasing fluid intake and taking a stool softener will usually help or prevent this problem from occurring.  A mild laxative (Milk of Magnesia or Miralax) should be taken according to package directions if there are no bowel movements after 48 hours.  You may have steri-strips (small skin tapes) in place directly over the incision.  These strips should be left on the skin for 7-10 days.  If your surgeon used skin glue on the incision, you may shower in 24 hours.  The glue will flake off over the next 2-3 weeks.  Any sutures or staples will be removed at the office during your follow-up visit. You may find that a light gauze bandage over your incision may keep your staples from being rubbed or pulled. You may shower and replace the bandage daily. ACTIVITIES:  You may resume regular (light) daily activities beginning the next day--such as daily self-care, walking, climbing stairs--gradually increasing activities as tolerated.  You may have  sexual intercourse when it is comfortable.  Refrain from any heavy lifting or straining until approved by your doctor.  You may drive when you no longer are taking prescription pain medication, you can comfortably wear a seatbelt, and you can safely maneuver your car and  apply brakes. You should see your doctor in the office for a follow-up appointment approximately two weeks after your surgery.  Make sure that you call for this appointment within a day or two after you arrive home to insure a convenient appointment time.  WHEN TO CALL YOUR DOCTOR: Fever over 101.0 Inability to urinate Nausea and/or vomiting Extreme swelling or bruising Continued bleeding from incision. Increased pain, redness, or drainage from the incision. Difficulty swallowing or breathing Muscle cramping or spasms. Numbness or tingling in hands or feet or around lips.  IF YOU HAVE DISABILITY OR FAMILY LEAVE FORMS, YOU MUST BRING THEM TO THE OFFICE FOR PROCESSING.  PLEASE DO NOT GIVE THEM TO YOUR DOCTOR.  The clinic staff is available to answer your questions during regular business hours.  Please don't hesitate to call and ask to speak to one of the nurses if you have concerns.  For further questions, please visit [email or web address]   Remove dressing in 24 hours      Comments:   May shower Tuesday 6/18 morning.  Leave Steristrips one week.  Velora Heckler, MD, [facility] Office: [phone]     Medication List  As of 04/20/2012  2:27 PM   STOP taking these medications         amoxicillin-clavulanate 875-125 MG per tablet         TAKE these medications         acetaminophen 325 MG tablet   Commonly known as: TYLENOL   Take 2 tablets (650 mg total) by mouth every 6 (six) hours as needed for pain.      aspirin 325 MG tablet   Take 325 mg by mouth daily with breakfast.      ezetimibe 10 MG tablet   Commonly known as: ZETIA   Take 10 mg by mouth daily. AT NIGHT      HYDROcodone-acetaminophen 5-325 MG per tablet   Commonly known as: NORCO   Take 1-2 tablets by mouth every 4 (four) hours as needed.      metFORMIN 1000 MG tablet   Commonly known as: GLUCOPHAGE   Take 1,000 mg by mouth 2 (two) times daily with a meal.       multivitamin with minerals Tabs   Take 1 tablet by mouth daily.      rosuvastatin 20 MG tablet   Commonly known as: CRESTOR   Take 20 mg by mouth daily. AT NIGHT      telmisartan-hydrochlorothiazide 80-12.5 MG per tablet   Commonly known as: MICARDIS HCT   Take 1 tablet by mouth daily with breakfast.           Velora Heckler, MD, [facility] Office: [phone]    Signed: Velora Heckler 04/20/2012, 2:27 PM

## 2012-04-21 ENCOUNTER — Telehealth (INDEPENDENT_AMBULATORY_CARE_PROVIDER_SITE_OTHER): Payer: Self-pay

## 2012-04-21 NOTE — Telephone Encounter (Signed)
Pt home. Pt states she has vomited earlier today about 10:00am. Pt has d/c pain med. Pt states she ate some soup and crackers a little bit ago and this has stayed down. I encouraged her to drink fluids and stay on light diet until nausea resolves.  Pt and her husband advised if vomiting recurs she is to call our office ever after hours and advise our MD on call. Pt states she understands. Pt given po appt.

## 2012-05-11 ENCOUNTER — Ambulatory Visit (INDEPENDENT_AMBULATORY_CARE_PROVIDER_SITE_OTHER): Payer: Medicare Other | Admitting: Surgery

## 2012-05-11 ENCOUNTER — Encounter (INDEPENDENT_AMBULATORY_CARE_PROVIDER_SITE_OTHER): Payer: Self-pay | Admitting: Surgery

## 2012-05-11 VITALS — BP 118/62 | HR 104 | Temp 97.2°F | Resp 16 | Ht 60.0 in | Wt 142.0 lb

## 2012-05-11 DIAGNOSIS — K810 Acute cholecystitis: Secondary | ICD-10-CM

## 2012-05-11 DIAGNOSIS — K651 Peritoneal abscess: Secondary | ICD-10-CM

## 2012-05-11 DIAGNOSIS — K823 Fistula of gallbladder: Secondary | ICD-10-CM

## 2012-05-11 NOTE — Patient Instructions (Signed)
  COCOA BUTTER & VITAMIN E CREAM  ([employer] or other brand)  Apply cocoa butter/vitamin E cream to your incision 2 - 3 times daily.  Massage cream into incision for one minute with each application.  Use sunscreen (50 SPF or higher) for first 6 months after surgery if area is exposed to sun.  You may substitute Mederma or other scar reducing creams as desired.   

## 2012-05-11 NOTE — Progress Notes (Signed)
General Surgery [facility]  Visit Diagnoses: 1. Cholecystocolonic fistula   2. Right upper quadrant abdominal abscess   3. Suppurative cholecystitis     HISTORY: The patient returns for a postoperative visit having undergone segmental colectomy and cholecystectomy for complicated fistula and abscess.  Patient is tolerating a regular diet. She has had 2 episodes of emesis but thinks it is related to what she ate. She is having normal bowel movements. She denies fevers or chills. Her energy level is improving.  EXAM: Abdomen is soft without distention. Midline surgical incision is well-healed. No sign of herniation. Drain sites in the right upper quadrant are well healed. No palpable mass. No tenderness.  IMPRESSION: Status post open cholecystectomy and right colectomy for a fistula between the colon and gallbladder with associated abscess. No evidence of malignancy.  PLAN: Patient continues to make slow but steady improvement. We will keep benign on the intermittent episodes of emesis. No additional workup at this point in time. I will see her back for followup in 8 weeks.  Velora Heckler, MD, FACS General & Endocrine Surgery [facility]

## 2012-05-18 ENCOUNTER — Telehealth (INDEPENDENT_AMBULATORY_CARE_PROVIDER_SITE_OTHER): Payer: Self-pay | Admitting: General Surgery

## 2012-05-18 NOTE — Telephone Encounter (Signed)
Pt calling in to report she woke today with nausea and vomiting.  She denies fever, rolling gas in abdomen or diarrhea.  She had a normal BM this morning.  Advised sipping on gingerale, Sprite or sweet tea until stomach settles, then try crackers or dry toast.  If needed she can take the meds at home for nausea.  She appreciated the advice.

## 2012-06-30 DIAGNOSIS — E119 Type 2 diabetes mellitus without complications: Secondary | ICD-10-CM | POA: Diagnosis not present

## 2012-06-30 DIAGNOSIS — E782 Mixed hyperlipidemia: Secondary | ICD-10-CM | POA: Diagnosis not present

## 2012-06-30 DIAGNOSIS — I10 Essential (primary) hypertension: Secondary | ICD-10-CM | POA: Diagnosis not present

## 2012-06-30 DIAGNOSIS — D649 Anemia, unspecified: Secondary | ICD-10-CM | POA: Diagnosis not present

## 2012-07-03 ENCOUNTER — Other Ambulatory Visit: Payer: Self-pay | Admitting: Family Medicine

## 2012-07-03 DIAGNOSIS — Z1231 Encounter for screening mammogram for malignant neoplasm of breast: Secondary | ICD-10-CM

## 2012-07-07 ENCOUNTER — Encounter (INDEPENDENT_AMBULATORY_CARE_PROVIDER_SITE_OTHER): Payer: Self-pay | Admitting: Surgery

## 2012-07-07 ENCOUNTER — Ambulatory Visit (INDEPENDENT_AMBULATORY_CARE_PROVIDER_SITE_OTHER): Payer: Medicare Other | Admitting: Surgery

## 2012-07-07 VITALS — BP 118/64 | HR 68 | Temp 97.2°F | Resp 16 | Ht 60.0 in | Wt 149.6 lb

## 2012-07-07 DIAGNOSIS — K823 Fistula of gallbladder: Secondary | ICD-10-CM

## 2012-07-07 DIAGNOSIS — K651 Peritoneal abscess: Secondary | ICD-10-CM

## 2012-07-07 DIAGNOSIS — K810 Acute cholecystitis: Secondary | ICD-10-CM

## 2012-07-07 NOTE — Progress Notes (Signed)
General Surgery [facility]  Visit Diagnoses: 1. Suppurative cholecystitis   2. Right upper quadrant abdominal abscess   3. Cholecystocolonic fistula     HISTORY: Patient is a 71 year old black female who underwent segmental colectomy and cholecystectomy for cholecystic-colonic fistula.  She returns today for final wound check.  Patient states that her appetite is improving. She is having normal bowel movements. She denies abdominal pain. She denies fevers or chills.  EXAM: Abdomen is soft nontender without distention. Surgical wounds are well healed. With Valsalva and cough there is no sign of herniation.  IMPRESSION: Status post segmental colonic resection and cholecystectomy  PLAN: Patient is released to full activity without restriction. She should followup with her primary care physician. She should have colonoscopy at regular intervals. She will return to see me as needed.  Velora Heckler, MD, FACS General & Endocrine Surgery [facility]

## 2012-07-31 ENCOUNTER — Ambulatory Visit
Admission: RE | Admit: 2012-07-31 | Discharge: 2012-07-31 | Disposition: A | Discharge status: Home / Self Care | Payer: Medicare Other | Source: Ambulatory Visit | Attending: Family Medicine | Admitting: Family Medicine

## 2012-07-31 DIAGNOSIS — Z1231 Encounter for screening mammogram for malignant neoplasm of breast: Secondary | ICD-10-CM

## 2012-08-06 DIAGNOSIS — E119 Type 2 diabetes mellitus without complications: Secondary | ICD-10-CM | POA: Diagnosis not present

## 2012-08-06 DIAGNOSIS — H2510 Age-related nuclear cataract, unspecified eye: Secondary | ICD-10-CM | POA: Diagnosis not present

## 2012-08-06 DIAGNOSIS — H40019 Open angle with borderline findings, low risk, unspecified eye: Secondary | ICD-10-CM | POA: Diagnosis not present

## 2012-08-06 DIAGNOSIS — Z23 Encounter for immunization: Secondary | ICD-10-CM | POA: Diagnosis not present

## 2013-01-11 DIAGNOSIS — E782 Mixed hyperlipidemia: Secondary | ICD-10-CM | POA: Diagnosis not present

## 2013-01-11 DIAGNOSIS — I10 Essential (primary) hypertension: Secondary | ICD-10-CM | POA: Diagnosis not present

## 2013-01-11 DIAGNOSIS — D649 Anemia, unspecified: Secondary | ICD-10-CM | POA: Diagnosis not present

## 2013-01-11 DIAGNOSIS — E119 Type 2 diabetes mellitus without complications: Secondary | ICD-10-CM | POA: Diagnosis not present

## 2013-01-18 IMAGING — XA IR FISTULA/SINUS TRACT
3 series · 11 of 11 positions shown · non-contrast
Comparison: none

CLINICAL HISTORY: Cholecystostomy tube placement and decreased
drainage from tube.

[Series 4: care single · 1 of 1 slices shown (1 of 2)]
[im 1/1]
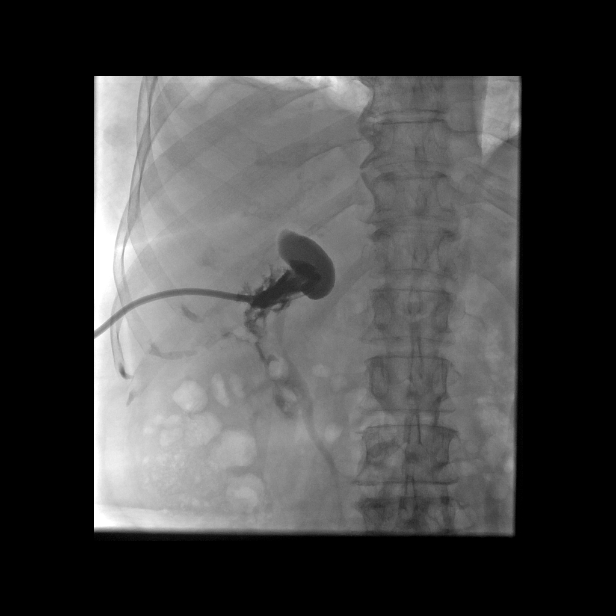

[Series 6: care single · 1 of 1 slices shown (2 of 2)]
[im 1/1]
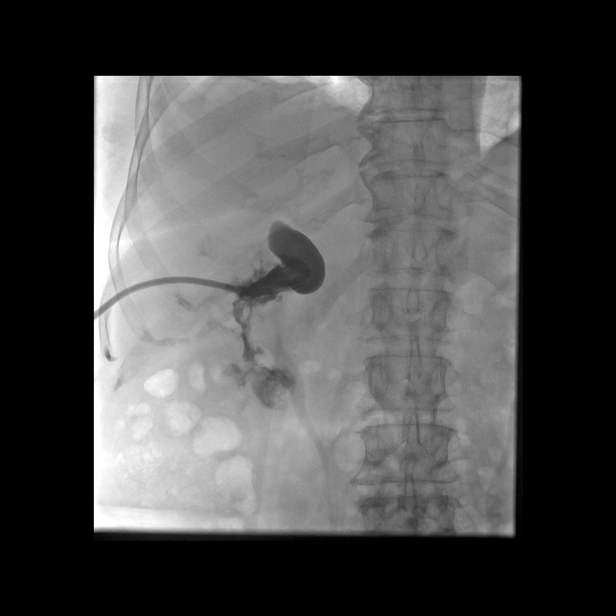

[Series 300: ir fistula / sinus track / abscess · 9 of 9 slices shown]
[im 1/9]
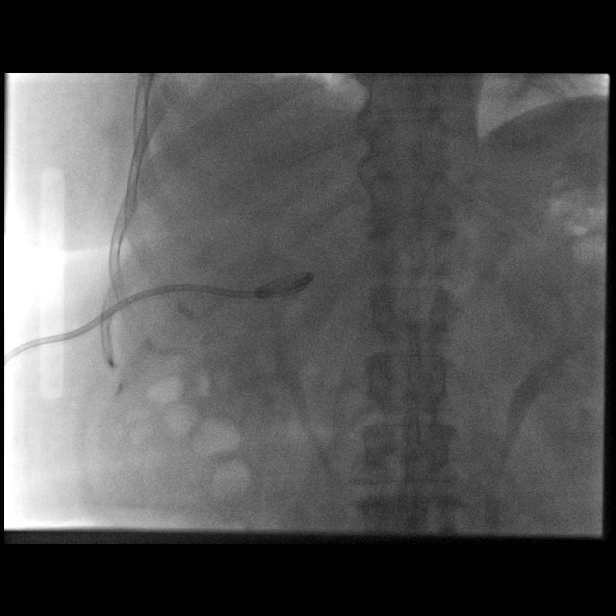
[im 2/9]
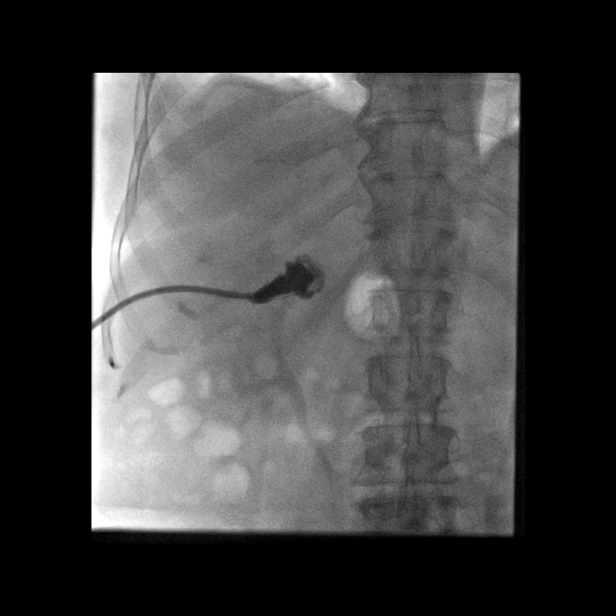
[im 3/9]
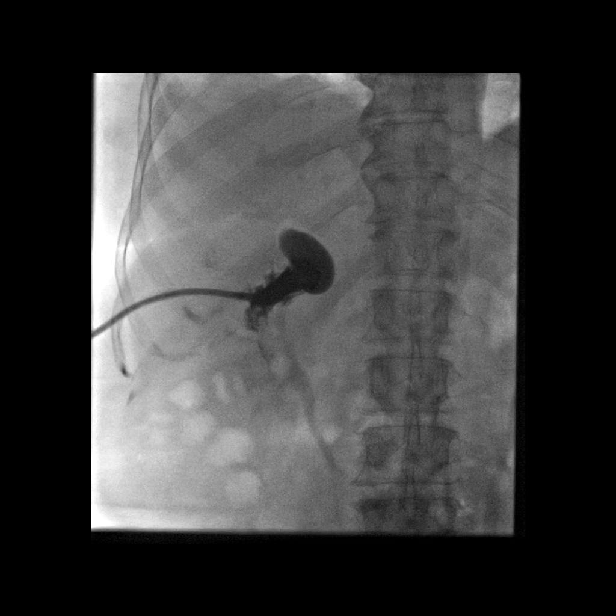
[im 4/9]
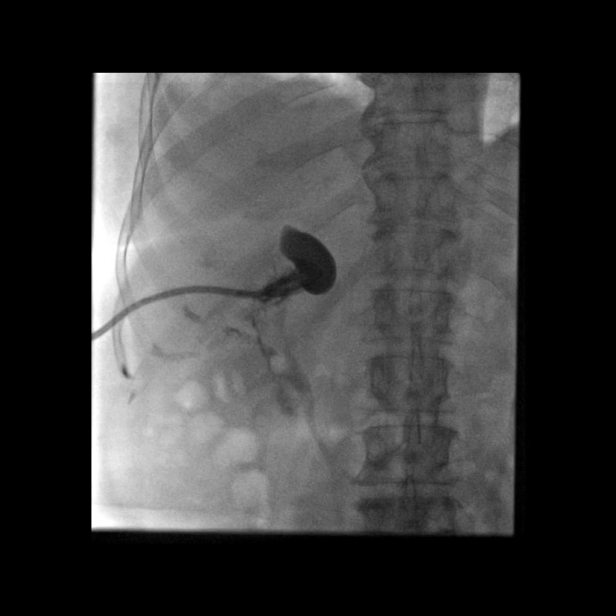
[im 5/9]
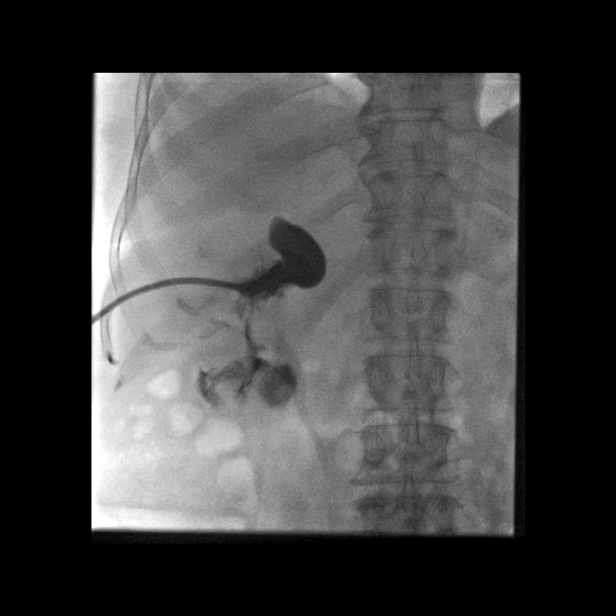
[im 6/9]
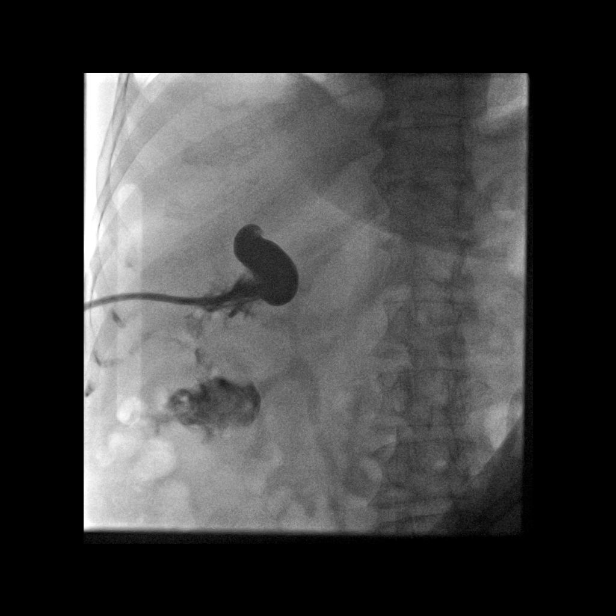
[im 7/9]
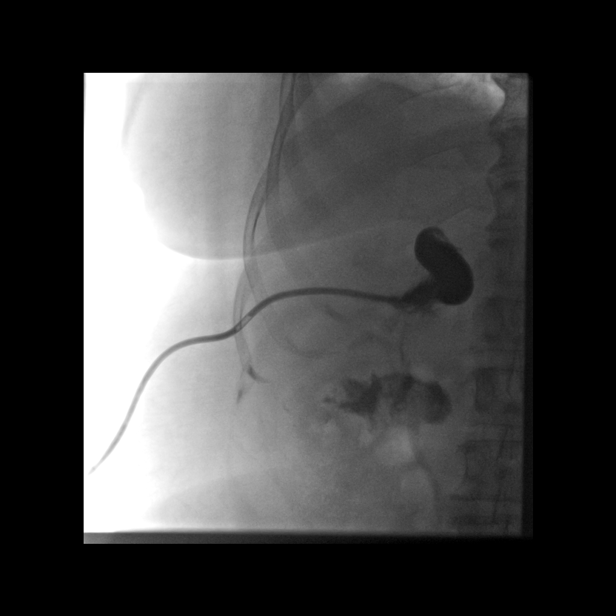
[im 8/9]
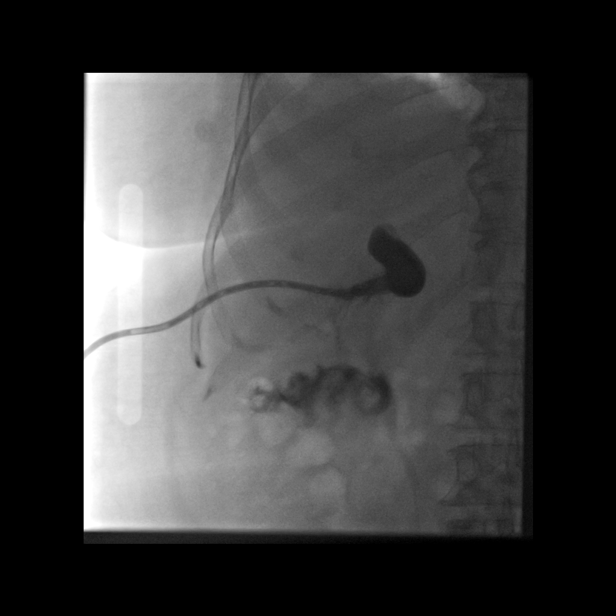
[im 9/9]
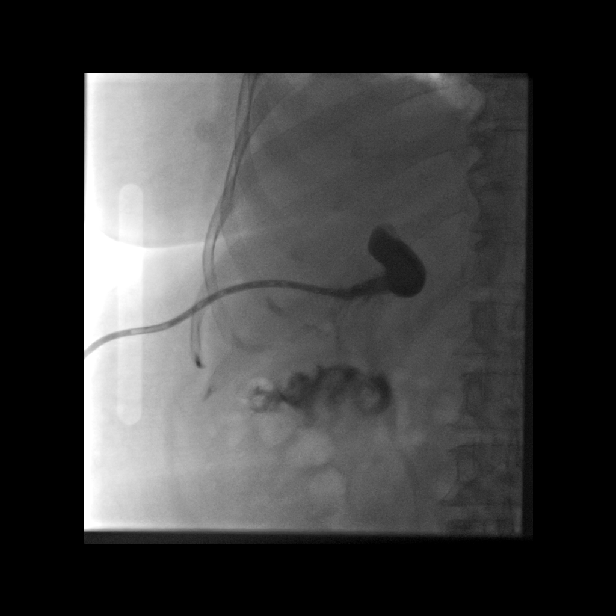

[11 of 11 positions shown; findings below may reference images not displayed]

PROCEDURE(S): INJECTION OF CHOLECYSTOSTOMY TUBE UNDER FLUOROSCOPY

Medications:None

Moderate sedation time:None

Fluoroscopy time: 0.6 minutes

Contrast:  11 ml [id number]-KMM

Procedure:The cholecystostomy tube was injected under fluoroscopy.
The tube was aspirated and flushed with normal saline at the end of
the procedure.
FINDINGS: Pigtail catheter is positioned in the right upper
quadrant.  Injection of contrast demonstrates filling of the
gallbladder.  The gallbladder fundus is very irregular and there is
caudal extravasation.  Based on the recent CT, the contrast appears
to be draining into the adjacent colon.  There is no filling of the
cystic duct.

Complications: None
IMPRESSION: Drainage catheter is positioned in the gallbladder.
There is marked irregularity of the gallbladder fundus and findings
are suggestive for a gallbladder perforation.  There is a fistula
connection to the right colon.

No filling of the cystic duct.

## 2013-03-15 IMAGING — CR DG ABDOMEN 2V
3 series · 3 of 3 positions shown · non-contrast
Comparison: CT abdomen pelvis of 02/20/2012

CLINICAL DATA: Post cholecystectomy and partial colectomy,
persistent ileus, follow-up

ABDOMEN - 2 VIEW

[w abdomen upright *]
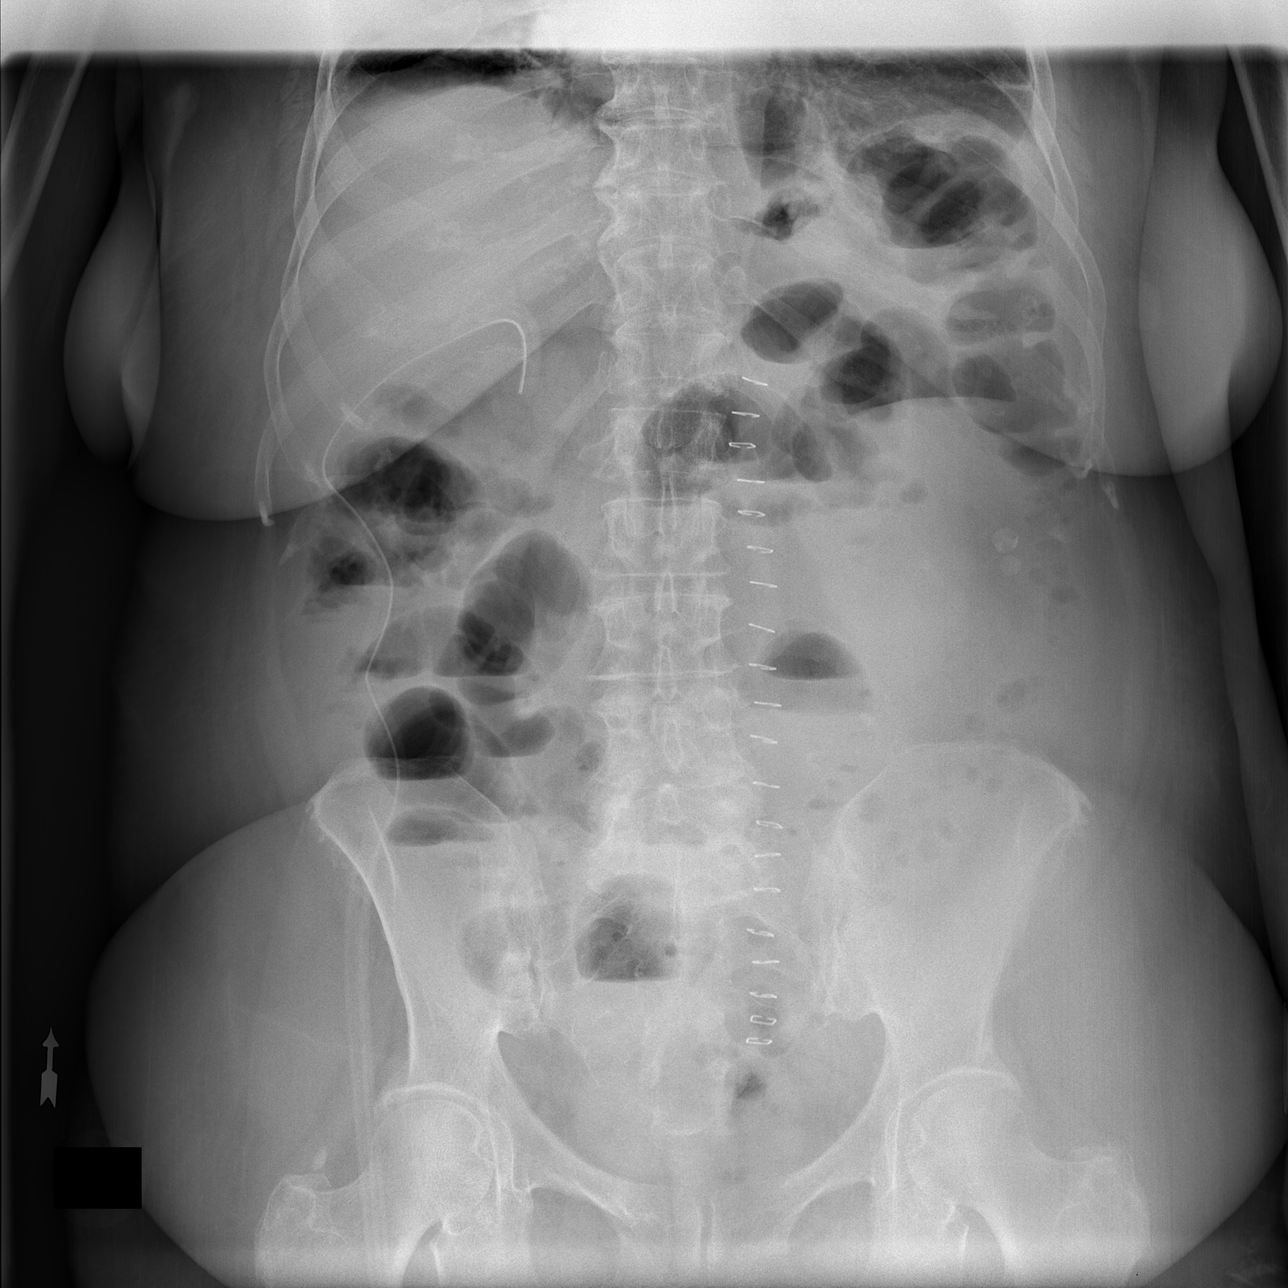

[t abdomen supine *]
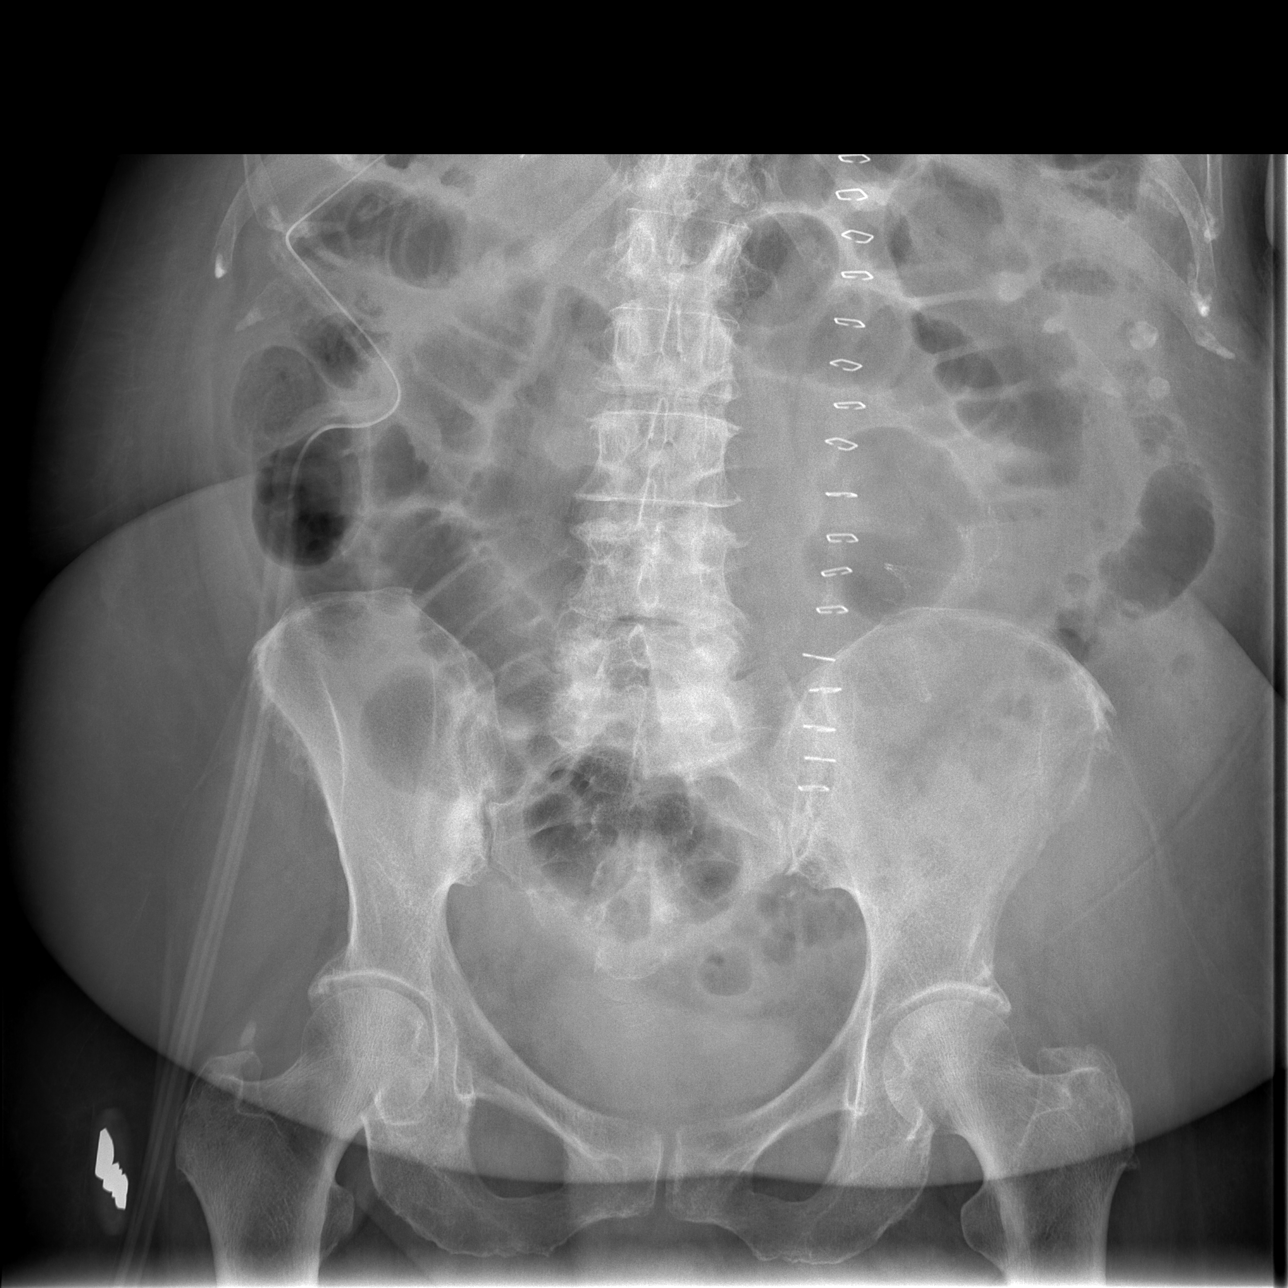

[t abdomen supine]
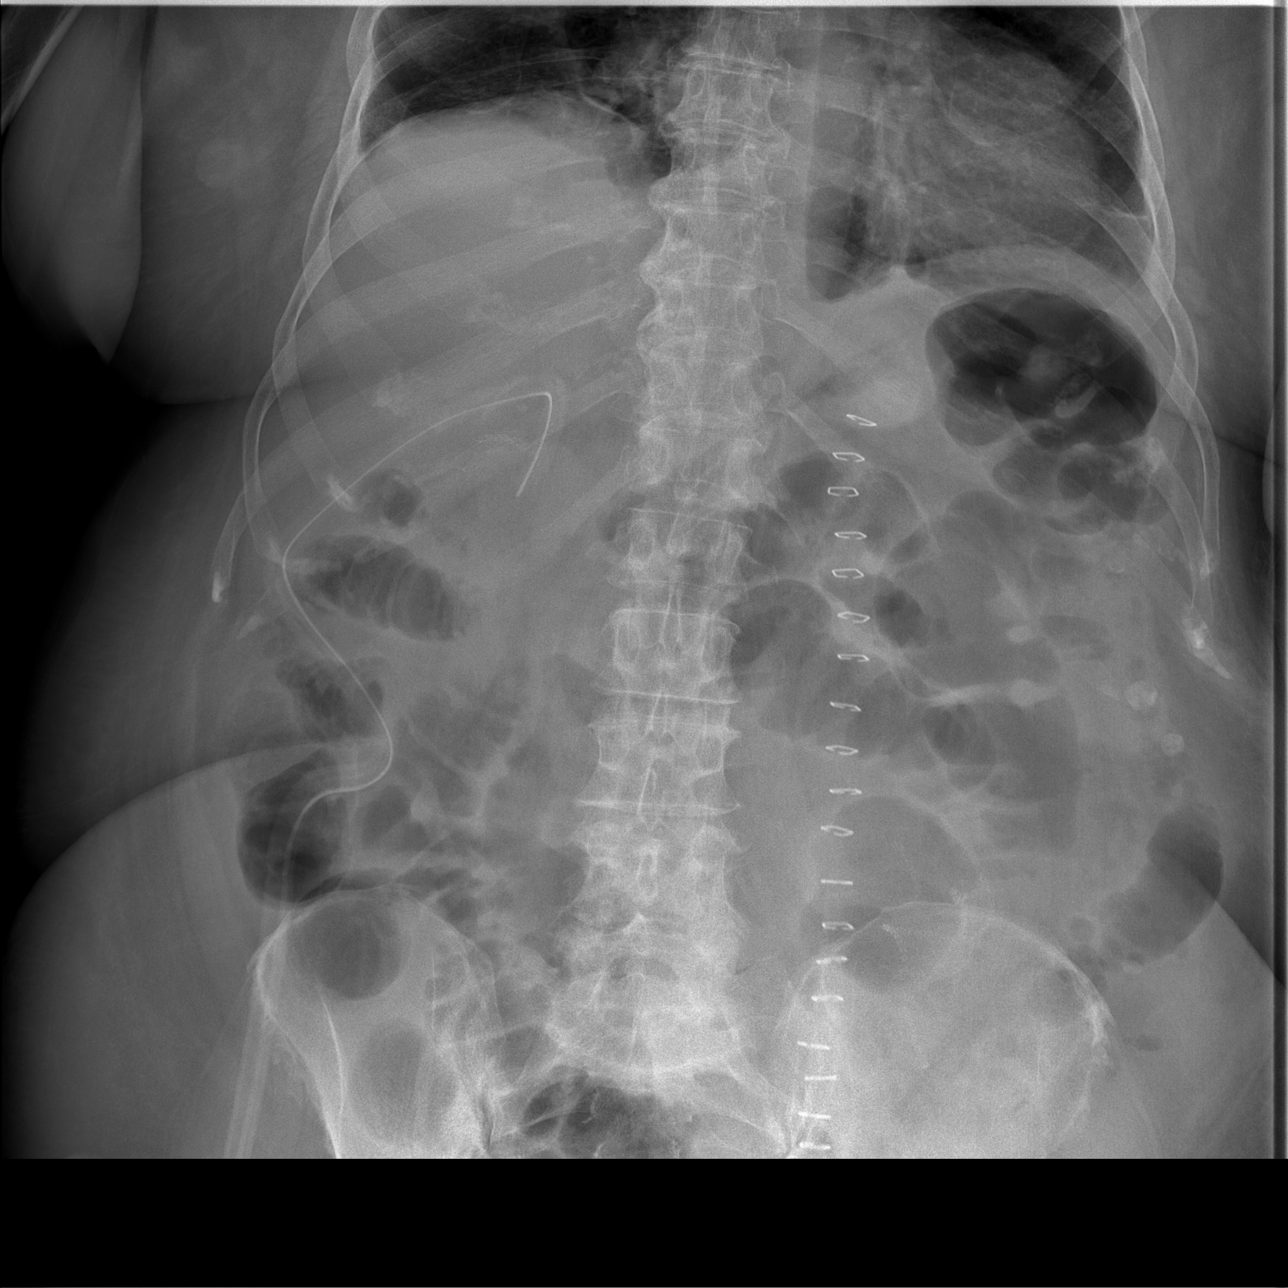

[3 of 3 positions shown; findings below may reference images not displayed]

FINDINGS: Supine and erect views of the abdomen do show gaseous
distention of small bowel loops with scattered air-fluid levels.
Some colonic bowel gas is seen.  This may reflect ileus, but
partial obstruction cannot be excluded.  A catheter overlies the
right abdomen.  No free air is seen on the erect view.  The bones
are osteopenic.
IMPRESSION: Persistent gaseous distention of both large and small bowel with
air-fluid levels, most consistent with ileus.  Difficult to exclude
partial obstruction.

## 2013-07-19 DIAGNOSIS — E119 Type 2 diabetes mellitus without complications: Secondary | ICD-10-CM | POA: Diagnosis not present

## 2013-07-19 DIAGNOSIS — Z13820 Encounter for screening for osteoporosis: Secondary | ICD-10-CM | POA: Diagnosis not present

## 2013-07-19 DIAGNOSIS — E782 Mixed hyperlipidemia: Secondary | ICD-10-CM | POA: Diagnosis not present

## 2013-07-19 DIAGNOSIS — I10 Essential (primary) hypertension: Secondary | ICD-10-CM | POA: Diagnosis not present

## 2013-07-19 DIAGNOSIS — D649 Anemia, unspecified: Secondary | ICD-10-CM | POA: Diagnosis not present

## 2013-07-19 DIAGNOSIS — Z1331 Encounter for screening for depression: Secondary | ICD-10-CM | POA: Diagnosis not present

## 2013-07-19 DIAGNOSIS — Z23 Encounter for immunization: Secondary | ICD-10-CM | POA: Diagnosis not present

## 2013-07-26 ENCOUNTER — Other Ambulatory Visit: Payer: Self-pay

## 2013-07-26 DIAGNOSIS — Z1231 Encounter for screening mammogram for malignant neoplasm of breast: Secondary | ICD-10-CM

## 2013-08-12 ENCOUNTER — Ambulatory Visit: Payer: Medicare Other

## 2013-08-17 ENCOUNTER — Ambulatory Visit
Admission: RE | Admit: 2013-08-17 | Discharge: 2013-08-17 | Disposition: A | Discharge status: Home / Self Care | Payer: Medicare Other | Source: Ambulatory Visit

## 2013-08-17 DIAGNOSIS — Z1231 Encounter for screening mammogram for malignant neoplasm of breast: Secondary | ICD-10-CM | POA: Diagnosis not present

## 2013-08-18 DIAGNOSIS — Z780 Asymptomatic menopausal state: Secondary | ICD-10-CM | POA: Diagnosis not present

## 2013-09-09 DIAGNOSIS — E119 Type 2 diabetes mellitus without complications: Secondary | ICD-10-CM | POA: Diagnosis not present

## 2013-09-09 DIAGNOSIS — H2510 Age-related nuclear cataract, unspecified eye: Secondary | ICD-10-CM | POA: Diagnosis not present

## 2014-02-07 DIAGNOSIS — E119 Type 2 diabetes mellitus without complications: Secondary | ICD-10-CM | POA: Diagnosis not present

## 2014-02-07 DIAGNOSIS — I10 Essential (primary) hypertension: Secondary | ICD-10-CM | POA: Diagnosis not present

## 2014-02-07 DIAGNOSIS — E782 Mixed hyperlipidemia: Secondary | ICD-10-CM | POA: Diagnosis not present

## 2014-02-07 DIAGNOSIS — D649 Anemia, unspecified: Secondary | ICD-10-CM | POA: Diagnosis not present

## 2014-07-22 ENCOUNTER — Other Ambulatory Visit: Payer: Self-pay

## 2014-07-22 DIAGNOSIS — Z1231 Encounter for screening mammogram for malignant neoplasm of breast: Secondary | ICD-10-CM

## 2014-08-22 ENCOUNTER — Encounter (INDEPENDENT_AMBULATORY_CARE_PROVIDER_SITE_OTHER): Payer: Self-pay

## 2014-08-22 ENCOUNTER — Ambulatory Visit
Admission: RE | Admit: 2014-08-22 | Discharge: 2014-08-22 | Disposition: A | Discharge status: Home / Self Care | Payer: Medicare Other | Source: Ambulatory Visit

## 2014-08-22 DIAGNOSIS — Z1231 Encounter for screening mammogram for malignant neoplasm of breast: Secondary | ICD-10-CM | POA: Diagnosis not present

## 2014-08-23 DIAGNOSIS — L259 Unspecified contact dermatitis, unspecified cause: Secondary | ICD-10-CM | POA: Diagnosis not present

## 2014-08-23 DIAGNOSIS — D649 Anemia, unspecified: Secondary | ICD-10-CM | POA: Diagnosis not present

## 2014-08-23 DIAGNOSIS — I10 Essential (primary) hypertension: Secondary | ICD-10-CM | POA: Diagnosis not present

## 2014-08-23 DIAGNOSIS — E782 Mixed hyperlipidemia: Secondary | ICD-10-CM | POA: Diagnosis not present

## 2014-08-23 DIAGNOSIS — Z23 Encounter for immunization: Secondary | ICD-10-CM | POA: Diagnosis not present

## 2014-08-23 DIAGNOSIS — E119 Type 2 diabetes mellitus without complications: Secondary | ICD-10-CM | POA: Diagnosis not present

## 2014-08-23 DIAGNOSIS — Z1389 Encounter for screening for other disorder: Secondary | ICD-10-CM | POA: Diagnosis not present

## 2014-08-24 DIAGNOSIS — D649 Anemia, unspecified: Secondary | ICD-10-CM | POA: Diagnosis not present

## 2014-08-24 DIAGNOSIS — I10 Essential (primary) hypertension: Secondary | ICD-10-CM | POA: Diagnosis not present

## 2014-08-24 DIAGNOSIS — E119 Type 2 diabetes mellitus without complications: Secondary | ICD-10-CM | POA: Diagnosis not present

## 2014-08-24 DIAGNOSIS — E782 Mixed hyperlipidemia: Secondary | ICD-10-CM | POA: Diagnosis not present

## 2014-10-03 DIAGNOSIS — H2513 Age-related nuclear cataract, bilateral: Secondary | ICD-10-CM | POA: Diagnosis not present

## 2014-10-03 DIAGNOSIS — E1169 Type 2 diabetes mellitus with other specified complication: Secondary | ICD-10-CM | POA: Diagnosis not present

## 2015-02-28 DIAGNOSIS — E782 Mixed hyperlipidemia: Secondary | ICD-10-CM | POA: Diagnosis not present

## 2015-02-28 DIAGNOSIS — E1165 Type 2 diabetes mellitus with hyperglycemia: Secondary | ICD-10-CM | POA: Diagnosis not present

## 2015-02-28 DIAGNOSIS — I10 Essential (primary) hypertension: Secondary | ICD-10-CM | POA: Diagnosis not present

## 2015-02-28 DIAGNOSIS — D649 Anemia, unspecified: Secondary | ICD-10-CM | POA: Diagnosis not present

## 2015-02-28 DIAGNOSIS — K219 Gastro-esophageal reflux disease without esophagitis: Secondary | ICD-10-CM | POA: Diagnosis not present

## 2015-06-05 DIAGNOSIS — M25551 Pain in right hip: Secondary | ICD-10-CM | POA: Diagnosis not present

## 2015-06-05 DIAGNOSIS — Z1389 Encounter for screening for other disorder: Secondary | ICD-10-CM | POA: Diagnosis not present

## 2015-06-05 DIAGNOSIS — I10 Essential (primary) hypertension: Secondary | ICD-10-CM | POA: Diagnosis not present

## 2015-06-05 DIAGNOSIS — E782 Mixed hyperlipidemia: Secondary | ICD-10-CM | POA: Diagnosis not present

## 2015-06-05 DIAGNOSIS — E1165 Type 2 diabetes mellitus with hyperglycemia: Secondary | ICD-10-CM | POA: Diagnosis not present

## 2015-06-05 DIAGNOSIS — D649 Anemia, unspecified: Secondary | ICD-10-CM | POA: Diagnosis not present

## 2015-06-05 DIAGNOSIS — K219 Gastro-esophageal reflux disease without esophagitis: Secondary | ICD-10-CM | POA: Diagnosis not present

## 2015-06-27 ENCOUNTER — Ambulatory Visit
Admission: RE | Admit: 2015-06-27 | Discharge: 2015-06-27 | Disposition: A | Discharge status: Home / Self Care | Payer: Medicare Other | Source: Ambulatory Visit | Attending: Family Medicine | Admitting: Family Medicine

## 2015-06-27 ENCOUNTER — Other Ambulatory Visit: Payer: Self-pay | Admitting: Family Medicine

## 2015-06-27 DIAGNOSIS — M25551 Pain in right hip: Secondary | ICD-10-CM

## 2015-06-27 DIAGNOSIS — M1611 Unilateral primary osteoarthritis, right hip: Secondary | ICD-10-CM | POA: Diagnosis not present

## 2015-07-25 ENCOUNTER — Other Ambulatory Visit: Payer: Self-pay

## 2015-07-25 DIAGNOSIS — Z1231 Encounter for screening mammogram for malignant neoplasm of breast: Secondary | ICD-10-CM

## 2015-08-28 ENCOUNTER — Ambulatory Visit
Admission: RE | Admit: 2015-08-28 | Discharge: 2015-08-28 | Disposition: A | Discharge status: Home / Self Care | Payer: Medicare Other | Source: Ambulatory Visit

## 2015-08-28 DIAGNOSIS — Z1231 Encounter for screening mammogram for malignant neoplasm of breast: Secondary | ICD-10-CM

## 2015-08-29 DIAGNOSIS — Z23 Encounter for immunization: Secondary | ICD-10-CM | POA: Diagnosis not present

## 2015-11-20 DIAGNOSIS — H524 Presbyopia: Secondary | ICD-10-CM | POA: Diagnosis not present

## 2015-11-20 DIAGNOSIS — H5203 Hypermetropia, bilateral: Secondary | ICD-10-CM | POA: Diagnosis not present

## 2015-11-20 DIAGNOSIS — H52203 Unspecified astigmatism, bilateral: Secondary | ICD-10-CM | POA: Diagnosis not present

## 2015-12-25 DIAGNOSIS — Z7984 Long term (current) use of oral hypoglycemic drugs: Secondary | ICD-10-CM | POA: Diagnosis not present

## 2015-12-25 DIAGNOSIS — E782 Mixed hyperlipidemia: Secondary | ICD-10-CM | POA: Diagnosis not present

## 2015-12-25 DIAGNOSIS — H6120 Impacted cerumen, unspecified ear: Secondary | ICD-10-CM | POA: Diagnosis not present

## 2015-12-25 DIAGNOSIS — L309 Dermatitis, unspecified: Secondary | ICD-10-CM | POA: Diagnosis not present

## 2015-12-25 DIAGNOSIS — E1165 Type 2 diabetes mellitus with hyperglycemia: Secondary | ICD-10-CM | POA: Diagnosis not present

## 2015-12-25 DIAGNOSIS — I10 Essential (primary) hypertension: Secondary | ICD-10-CM | POA: Diagnosis not present

## 2015-12-25 DIAGNOSIS — D649 Anemia, unspecified: Secondary | ICD-10-CM | POA: Diagnosis not present

## 2015-12-25 DIAGNOSIS — K219 Gastro-esophageal reflux disease without esophagitis: Secondary | ICD-10-CM | POA: Diagnosis not present

## 2016-07-09 ENCOUNTER — Other Ambulatory Visit: Payer: Self-pay | Admitting: Family Medicine

## 2016-07-09 ENCOUNTER — Ambulatory Visit
Admission: RE | Admit: 2016-07-09 | Discharge: 2016-07-09 | Disposition: A | Discharge status: Home / Self Care | Payer: Medicare HMO | Source: Ambulatory Visit | Attending: Family Medicine | Admitting: Family Medicine

## 2016-07-09 DIAGNOSIS — M25531 Pain in right wrist: Secondary | ICD-10-CM

## 2016-07-09 DIAGNOSIS — M19031 Primary osteoarthritis, right wrist: Secondary | ICD-10-CM | POA: Diagnosis not present

## 2016-07-15 DIAGNOSIS — M859 Disorder of bone density and structure, unspecified: Secondary | ICD-10-CM | POA: Diagnosis not present

## 2016-07-15 DIAGNOSIS — I10 Essential (primary) hypertension: Secondary | ICD-10-CM | POA: Diagnosis not present

## 2016-07-15 DIAGNOSIS — K219 Gastro-esophageal reflux disease without esophagitis: Secondary | ICD-10-CM | POA: Diagnosis not present

## 2016-07-15 DIAGNOSIS — D649 Anemia, unspecified: Secondary | ICD-10-CM | POA: Diagnosis not present

## 2016-07-15 DIAGNOSIS — Z1211 Encounter for screening for malignant neoplasm of colon: Secondary | ICD-10-CM | POA: Diagnosis not present

## 2016-07-15 DIAGNOSIS — Z0000 Encounter for general adult medical examination without abnormal findings: Secondary | ICD-10-CM | POA: Diagnosis not present

## 2016-07-15 DIAGNOSIS — E782 Mixed hyperlipidemia: Secondary | ICD-10-CM | POA: Diagnosis not present

## 2016-07-15 DIAGNOSIS — E1165 Type 2 diabetes mellitus with hyperglycemia: Secondary | ICD-10-CM | POA: Diagnosis not present

## 2016-07-15 DIAGNOSIS — Z1389 Encounter for screening for other disorder: Secondary | ICD-10-CM | POA: Diagnosis not present

## 2016-07-15 DIAGNOSIS — M25531 Pain in right wrist: Secondary | ICD-10-CM | POA: Diagnosis not present

## 2016-07-22 DIAGNOSIS — I10 Essential (primary) hypertension: Secondary | ICD-10-CM | POA: Diagnosis not present

## 2016-07-22 DIAGNOSIS — E7800 Pure hypercholesterolemia, unspecified: Secondary | ICD-10-CM | POA: Diagnosis not present

## 2016-07-22 DIAGNOSIS — K219 Gastro-esophageal reflux disease without esophagitis: Secondary | ICD-10-CM | POA: Diagnosis not present

## 2016-07-22 DIAGNOSIS — Z0000 Encounter for general adult medical examination without abnormal findings: Secondary | ICD-10-CM | POA: Diagnosis not present

## 2016-07-22 DIAGNOSIS — E1165 Type 2 diabetes mellitus with hyperglycemia: Secondary | ICD-10-CM | POA: Diagnosis not present

## 2016-07-24 DIAGNOSIS — M1811 Unilateral primary osteoarthritis of first carpometacarpal joint, right hand: Secondary | ICD-10-CM | POA: Diagnosis not present

## 2016-08-07 DIAGNOSIS — M8588 Other specified disorders of bone density and structure, other site: Secondary | ICD-10-CM | POA: Diagnosis not present

## 2016-08-08 DIAGNOSIS — E119 Type 2 diabetes mellitus without complications: Secondary | ICD-10-CM | POA: Diagnosis not present

## 2016-08-19 ENCOUNTER — Other Ambulatory Visit: Payer: Self-pay | Admitting: Family Medicine

## 2016-08-19 DIAGNOSIS — Z1231 Encounter for screening mammogram for malignant neoplasm of breast: Secondary | ICD-10-CM

## 2016-09-02 ENCOUNTER — Ambulatory Visit
Admission: RE | Admit: 2016-09-02 | Discharge: 2016-09-02 | Disposition: A | Discharge status: Home / Self Care | Payer: Medicare HMO | Source: Ambulatory Visit | Attending: Family Medicine | Admitting: Family Medicine

## 2016-09-02 DIAGNOSIS — Z1231 Encounter for screening mammogram for malignant neoplasm of breast: Secondary | ICD-10-CM

## 2016-09-13 DIAGNOSIS — M25531 Pain in right wrist: Secondary | ICD-10-CM | POA: Diagnosis not present

## 2016-09-13 DIAGNOSIS — M791 Myalgia: Secondary | ICD-10-CM | POA: Diagnosis not present

## 2016-09-13 DIAGNOSIS — E782 Mixed hyperlipidemia: Secondary | ICD-10-CM | POA: Diagnosis not present

## 2016-09-18 DIAGNOSIS — M19011 Primary osteoarthritis, right shoulder: Secondary | ICD-10-CM | POA: Diagnosis not present

## 2016-09-18 DIAGNOSIS — M7542 Impingement syndrome of left shoulder: Secondary | ICD-10-CM | POA: Diagnosis not present

## 2016-09-23 DIAGNOSIS — M353 Polymyalgia rheumatica: Secondary | ICD-10-CM | POA: Diagnosis not present

## 2016-09-23 DIAGNOSIS — M791 Myalgia: Secondary | ICD-10-CM | POA: Diagnosis not present

## 2016-10-24 DIAGNOSIS — M353 Polymyalgia rheumatica: Secondary | ICD-10-CM | POA: Diagnosis not present

## 2016-10-30 DIAGNOSIS — M7542 Impingement syndrome of left shoulder: Secondary | ICD-10-CM | POA: Diagnosis not present

## 2016-10-30 DIAGNOSIS — M19011 Primary osteoarthritis, right shoulder: Secondary | ICD-10-CM | POA: Diagnosis not present

## 2016-11-06 DIAGNOSIS — E119 Type 2 diabetes mellitus without complications: Secondary | ICD-10-CM | POA: Diagnosis not present

## 2016-12-02 DIAGNOSIS — E669 Obesity, unspecified: Secondary | ICD-10-CM | POA: Diagnosis not present

## 2016-12-02 DIAGNOSIS — G3184 Mild cognitive impairment, so stated: Secondary | ICD-10-CM | POA: Diagnosis not present

## 2016-12-02 DIAGNOSIS — M159 Polyosteoarthritis, unspecified: Secondary | ICD-10-CM | POA: Diagnosis not present

## 2016-12-02 DIAGNOSIS — Z973 Presence of spectacles and contact lenses: Secondary | ICD-10-CM | POA: Diagnosis not present

## 2016-12-02 DIAGNOSIS — M47816 Spondylosis without myelopathy or radiculopathy, lumbar region: Secondary | ICD-10-CM | POA: Diagnosis not present

## 2016-12-02 DIAGNOSIS — Z972 Presence of dental prosthetic device (complete) (partial): Secondary | ICD-10-CM | POA: Diagnosis not present

## 2016-12-02 DIAGNOSIS — Z87891 Personal history of nicotine dependence: Secondary | ICD-10-CM | POA: Diagnosis not present

## 2016-12-02 DIAGNOSIS — K219 Gastro-esophageal reflux disease without esophagitis: Secondary | ICD-10-CM | POA: Diagnosis not present

## 2016-12-02 DIAGNOSIS — Z6831 Body mass index (BMI) 31.0-31.9, adult: Secondary | ICD-10-CM | POA: Diagnosis not present

## 2016-12-02 DIAGNOSIS — E7800 Pure hypercholesterolemia, unspecified: Secondary | ICD-10-CM | POA: Diagnosis not present

## 2016-12-02 DIAGNOSIS — I10 Essential (primary) hypertension: Secondary | ICD-10-CM | POA: Diagnosis not present

## 2016-12-02 DIAGNOSIS — Z0000 Encounter for general adult medical examination without abnormal findings: Secondary | ICD-10-CM | POA: Diagnosis not present

## 2016-12-02 DIAGNOSIS — E1165 Type 2 diabetes mellitus with hyperglycemia: Secondary | ICD-10-CM | POA: Diagnosis not present

## 2016-12-02 DIAGNOSIS — Z7984 Long term (current) use of oral hypoglycemic drugs: Secondary | ICD-10-CM | POA: Diagnosis not present

## 2016-12-05 DIAGNOSIS — Z1211 Encounter for screening for malignant neoplasm of colon: Secondary | ICD-10-CM | POA: Diagnosis not present

## 2017-01-28 DIAGNOSIS — K219 Gastro-esophageal reflux disease without esophagitis: Secondary | ICD-10-CM | POA: Diagnosis not present

## 2017-01-28 DIAGNOSIS — E1165 Type 2 diabetes mellitus with hyperglycemia: Secondary | ICD-10-CM | POA: Diagnosis not present

## 2017-01-28 DIAGNOSIS — Z7984 Long term (current) use of oral hypoglycemic drugs: Secondary | ICD-10-CM | POA: Diagnosis not present

## 2017-01-28 DIAGNOSIS — E782 Mixed hyperlipidemia: Secondary | ICD-10-CM | POA: Diagnosis not present

## 2017-01-28 DIAGNOSIS — I10 Essential (primary) hypertension: Secondary | ICD-10-CM | POA: Diagnosis not present

## 2017-01-28 DIAGNOSIS — D649 Anemia, unspecified: Secondary | ICD-10-CM | POA: Diagnosis not present

## 2017-01-28 DIAGNOSIS — M8580 Other specified disorders of bone density and structure, unspecified site: Secondary | ICD-10-CM | POA: Diagnosis not present

## 2017-01-28 DIAGNOSIS — M353 Polymyalgia rheumatica: Secondary | ICD-10-CM | POA: Diagnosis not present

## 2017-01-31 DIAGNOSIS — E119 Type 2 diabetes mellitus without complications: Secondary | ICD-10-CM | POA: Diagnosis not present

## 2017-03-03 DIAGNOSIS — E782 Mixed hyperlipidemia: Secondary | ICD-10-CM | POA: Diagnosis not present

## 2017-03-04 DIAGNOSIS — E119 Type 2 diabetes mellitus without complications: Secondary | ICD-10-CM | POA: Diagnosis not present

## 2017-06-02 DIAGNOSIS — E119 Type 2 diabetes mellitus without complications: Secondary | ICD-10-CM | POA: Diagnosis not present

## 2017-06-19 DIAGNOSIS — N7689 Other specified inflammation of vagina and vulva: Secondary | ICD-10-CM | POA: Diagnosis not present

## 2017-06-19 DIAGNOSIS — Z01411 Encounter for gynecological examination (general) (routine) with abnormal findings: Secondary | ICD-10-CM | POA: Diagnosis not present

## 2017-08-05 ENCOUNTER — Other Ambulatory Visit: Payer: Self-pay | Admitting: Family Medicine

## 2017-08-05 DIAGNOSIS — Z23 Encounter for immunization: Secondary | ICD-10-CM | POA: Diagnosis not present

## 2017-08-05 DIAGNOSIS — E782 Mixed hyperlipidemia: Secondary | ICD-10-CM | POA: Diagnosis not present

## 2017-08-05 DIAGNOSIS — K219 Gastro-esophageal reflux disease without esophagitis: Secondary | ICD-10-CM | POA: Diagnosis not present

## 2017-08-05 DIAGNOSIS — M85851 Other specified disorders of bone density and structure, right thigh: Secondary | ICD-10-CM | POA: Diagnosis not present

## 2017-08-05 DIAGNOSIS — D649 Anemia, unspecified: Secondary | ICD-10-CM | POA: Diagnosis not present

## 2017-08-05 DIAGNOSIS — M353 Polymyalgia rheumatica: Secondary | ICD-10-CM | POA: Diagnosis not present

## 2017-08-05 DIAGNOSIS — E1165 Type 2 diabetes mellitus with hyperglycemia: Secondary | ICD-10-CM | POA: Diagnosis not present

## 2017-08-05 DIAGNOSIS — Z1389 Encounter for screening for other disorder: Secondary | ICD-10-CM | POA: Diagnosis not present

## 2017-08-05 DIAGNOSIS — Z1231 Encounter for screening mammogram for malignant neoplasm of breast: Secondary | ICD-10-CM

## 2017-08-05 DIAGNOSIS — I10 Essential (primary) hypertension: Secondary | ICD-10-CM | POA: Diagnosis not present

## 2017-08-31 DIAGNOSIS — E119 Type 2 diabetes mellitus without complications: Secondary | ICD-10-CM | POA: Diagnosis not present

## 2017-09-03 ENCOUNTER — Ambulatory Visit
Admission: RE | Admit: 2017-09-03 | Discharge: 2017-09-03 | Disposition: A | Discharge status: Home / Self Care | Payer: Medicare HMO | Source: Ambulatory Visit | Attending: Family Medicine | Admitting: Family Medicine

## 2017-09-03 DIAGNOSIS — Z1231 Encounter for screening mammogram for malignant neoplasm of breast: Secondary | ICD-10-CM

## 2017-09-18 DIAGNOSIS — L309 Dermatitis, unspecified: Secondary | ICD-10-CM | POA: Diagnosis not present

## 2018-07-30 ENCOUNTER — Other Ambulatory Visit: Payer: Self-pay | Admitting: Family Medicine

## 2018-07-30 DIAGNOSIS — Z1231 Encounter for screening mammogram for malignant neoplasm of breast: Secondary | ICD-10-CM

## 2018-09-07 ENCOUNTER — Ambulatory Visit
Admission: RE | Admit: 2018-09-07 | Discharge: 2018-09-07 | Disposition: A | Discharge status: Home / Self Care | Payer: Medicare Other | Source: Ambulatory Visit | Attending: Family Medicine | Admitting: Family Medicine

## 2018-09-07 DIAGNOSIS — Z1231 Encounter for screening mammogram for malignant neoplasm of breast: Secondary | ICD-10-CM

## 2019-08-04 ENCOUNTER — Other Ambulatory Visit: Payer: Self-pay | Admitting: Family Medicine

## 2019-08-04 DIAGNOSIS — Z1231 Encounter for screening mammogram for malignant neoplasm of breast: Secondary | ICD-10-CM

## 2019-09-09 ENCOUNTER — Other Ambulatory Visit: Payer: Self-pay

## 2019-09-09 ENCOUNTER — Ambulatory Visit
Admission: RE | Admit: 2019-09-09 | Discharge: 2019-09-09 | Disposition: A | Discharge status: Home / Self Care | Payer: Medicare Other | Source: Ambulatory Visit | Attending: Family Medicine | Admitting: Family Medicine

## 2019-09-09 DIAGNOSIS — Z1231 Encounter for screening mammogram for malignant neoplasm of breast: Secondary | ICD-10-CM

## 2020-01-06 ENCOUNTER — Other Ambulatory Visit: Payer: Self-pay

## 2020-01-10 ENCOUNTER — Encounter: Payer: Self-pay | Admitting: Internal Medicine

## 2020-01-10 ENCOUNTER — Other Ambulatory Visit: Payer: Self-pay

## 2020-01-10 ENCOUNTER — Ambulatory Visit: Payer: Medicare Other | Admitting: Internal Medicine

## 2020-01-10 VITALS — BP 136/68 | HR 91 | Temp 98.3°F | Ht <= 58 in | Wt 154.6 lb

## 2020-01-10 DIAGNOSIS — E1165 Type 2 diabetes mellitus with hyperglycemia: Secondary | ICD-10-CM | POA: Insufficient documentation

## 2020-01-10 DIAGNOSIS — E119 Type 2 diabetes mellitus without complications: Secondary | ICD-10-CM | POA: Diagnosis not present

## 2020-01-10 LAB — POCT GLYCOSYLATED HEMOGLOBIN (HGB A1C): Hemoglobin A1C: 8 % — AB (ref 4.0–5.6)

## 2020-01-10 MED ORDER — GLIPIZIDE 5 MG PO TABS: 5.0000 mg | ORAL_TABLET | Freq: Every day | ORAL | 3 refills | Status: DC

## 2020-01-10 NOTE — Progress Notes (Signed)
Name: Kelly Farmer  MRN/ DOB: [id number], 02/07/1941   Age/ Sex: 79 y.o., female    PCP: Antony Contras, MD   Reason for Endocrinology Evaluation: Type 2 Diabetes Mellitus     Date of Initial Endocrinology Visit: 01/10/2020     PATIENT IDENTIFIER: Kelly Farmer is a 79 y.o. female with a past medical history of HTN,T2DM and Dyslipidemia. The patient presented for initial endocrinology clinic visit on 01/10/2020 for consultative assistance with her diabetes management.    HPI: Kelly Farmer was    Diagnosed with DM since the 1970's Prior Medications tried/Intolerance: Januvia-Cost, Jardiance - Rash  Currently checking blood sugars 1x / day,  before breakfast Hypoglycemia episodes : no       Hemoglobin A1c has ranged from 7.2% in 2019, peaking at 8.6% in 2020. Patient required assistance for hypoglycemia: no Patient has required hospitalization within the last 1 year from hyper or hypoglycemia: no   In terms of diet, the patient stays up all night and sleeps in . Eats 3 meals a day, occasional snacking, avoids sugar-sweetened beverages.    HOME DIABETES REGIMEN: Metformin 1000 mg BID   Statin: yes ACE-I/ARB: yes Prior Diabetic Education: Yes    METER DOWNLOAD SUMMARY: Date range evaluated: 2/23-01/10/2020 Fingerstick Blood Glucose Tests = 14 Average Number Tests/Day = 1.0 Overall Mean FS Glucose = 192  BG Ranges: Low = 181 High = 207   Hypoglycemic Events/30 Days: BG < 50 =0 Episodes of symptomatic severe hypoglycemia = 0   DIABETIC COMPLICATIONS: Microvascular complications:    Denies: neuropathy, retinopathy and CKD  Last eye exam: Completed 03/2019  Macrovascular complications:    Denies: CAD, PVD, CVA   PAST HISTORY: Past Medical History:  Past Medical History:  Diagnosis Date  . Abdominal pain    HOSP AT [facility] 02/13/12  TO  02/21/12 -DISCHARGE DX RUQ ABSCESS-MOST LIKELY GANGRENOUS CHLOLECYSTITIS WITH FISTULA TO THE COLON WITH ABSCESS;   DRAINAGE OF THE ABSCESS AND PLACEMENT OF DRAIN BY IR--PT STILL HAS THE DRAIN  TO DRAINAGE BAG AND IS ON ANTIBIOTIC  . Arthritis   . Diabetes mellitus    ORAL MED-NO INSULIN  . GERD (gastroesophageal reflux disease)    OCCAS  . Hypercholesteremia   . Hypertension    Past Surgical History:  Past Surgical History:  Procedure Laterality Date  . ABDOMINAL HYSTERECTOMY    . APPENDECTOMY    . CHOLECYSTECTOMY  04/10/2012   Procedure: CHOLECYSTECTOMY;  Surgeon: Earnstine Regal, MD;  Location: [facility];  Service: General;;  . COLOSTOMY REVISION  04/10/2012   Procedure: COLON RESECTION RIGHT;  Surgeon: Earnstine Regal, MD;  Location: [facility];  Service: General;;  . ESOPHAGOGASTRODUODENOSCOPY  02/14/2012   Procedure: ESOPHAGOGASTRODUODENOSCOPY (EGD);  Surgeon: Wonda Horner, MD;  Location: [facility];  Service: Endoscopy;  Laterality: N/A;  . LAPAROTOMY  04/10/2012   Procedure: EXPLORATORY LAPAROTOMY;  Surgeon: Earnstine Regal, MD;  Location: [facility];  Service: General;;      Social History:  reports that she has quit smoking. She has never used smokeless tobacco. She reports that she does not drink alcohol or use drugs. Family History:  Family History  Problem Relation Age of Onset  . Cancer Sister        breast  . Breast cancer Sister   . Breast cancer Sister   . Breast cancer Other   . Breast cancer Other   . Breast cancer Other      HOME MEDICATIONS: Allergies as of  01/10/2020   No Known Allergies     Medication List       Accurate as of January 10, 2020  7:54 AM. If you have any questions, ask your nurse or doctor.        aspirin 325 MG tablet Take 325 mg by mouth daily with breakfast.   CALCIUM 600-D PO Take by mouth.   ezetimibe 10 MG tablet Commonly known as: ZETIA Take 10 mg by mouth daily. AT NIGHT   latanoprost 0.005 % ophthalmic solution Commonly known as: XALATAN INSTILL 1 DROP INTO BOTH EYES EVERY DAY AT NIGHT   metFORMIN 1000 MG tablet Commonly known as:  GLUCOPHAGE Take 1,000 mg by mouth 2 (two) times daily with a meal.   multivitamin with minerals Tabs tablet Take 1 tablet by mouth daily.   ONE TOUCH ULTRA 2 w/Device Kit by Does not apply route. Once daily   pravastatin 20 MG tablet Commonly known as: PRAVACHOL Take 20 mg by mouth daily.   rosuvastatin 20 MG tablet Commonly known as: CRESTOR Take 20 mg by mouth daily. AT NIGHT   telmisartan-hydrochlorothiazide 80-12.5 MG tablet Commonly known as: MICARDIS HCT Take 1 tablet by mouth daily with breakfast.   triamcinolone ointment 0.1 % Commonly known as: KENALOG APPLY ON THE SKIN NIGHTLY APPLY TO RED ITCHY AREAS, TAPER AS ABLE        ALLERGIES: No Known Allergies   REVIEW OF SYSTEMS: A comprehensive ROS was conducted with the patient and is negative except as per HPI and below:  Review of Systems  Gastrointestinal: Negative for diarrhea and nausea.  Genitourinary: Negative for frequency.  Endo/Heme/Allergies: Negative for polydipsia.      OBJECTIVE:   VITAL SIGNS: BP 136/68 (BP Location: Left Arm, Patient Position: Sitting, Cuff Size: Large)   Pulse 91   Temp 98.3 F (36.8 C)   Ht _0  (1.473 m)   Wt 154 lb 9.6 oz (70.1 kg)   SpO2 98%   BMI 32.31 kg/m    PHYSICAL EXAM:  General: Pt appears well and is in NAD  Neck: General: Supple without adenopathy or carotid bruits. Thyroid: Thyroid size normal.  No goiter or nodules appreciated. No thyroid bruit.  Lungs: Clear with good BS bilat with no rales, rhonchi, or wheezes  Heart: RRR with normal S1 and S2 and no gallops; no murmurs; no rub  Extremities:  Lower extremities - No pretibial edema. No lesions.  Skin: Normal texture and temperature to palpation. No rash  Neuro: MS is good with appropriate affect, pt is alert and Ox3    DM foot exam: 01/10/2020  The skin of the feet is intact without sores or ulcerations. The pedal pulses are 2+ on right and 2+ on left. The sensation is intact to a screening  5.07, 10 gram monofilament bilaterally   DATA REVIEWED:  Lab Results  Component Value Date   HGBA1C 8.0 (A) 01/10/2020   HGBA1C 6.9 (H) 04/11/2012   Lab Results  Component Value Date   CREATININE 0.52 04/17/2012     10/05/2019 A1c 8.6 % BUN/Cr 18/0.78 GFR 71 TG 71 LDL 139 mg/dL    ASSESSMENT / PLAN / RECOMMENDATIONS:   1) Type 2 Diabetes Mellitus, Poorly controlled, Withoutcomplications - Most recent A1c of 8.0 %. Goal A1c < 7.0 %.     -A1c down from 8.6% -Patient is not on Jardiance anymore due to the development of a rash, she was also on Januvia at some point without reported side effects but  had cost issues. -We discussed adding a sulfonylurea, we did discuss the risk of hypoglycemia and weight gain with this class, despite her age of over 6 which increases the risk of hypoglycemia the fact that her renal function is normal is reassuring at this point. -I have emphasized the importance of taking glipizide with a meal, patient instructed to avoid glipizide if she is going to skip a meal. -She was also instructed to take her second dose of metformin with supper rather than bedtime -Patient advised to contact us with any hypoglycemic episodes (BG<70 mg/dL)  MEDICATIONS: - Metformin 1000 mg, 1 tablet with Breakfast and 1 tablet with supper - Start Glipizide 5 mg, 1 tablet before Breakfast    EDUCATION / INSTRUCTIONS:  BG monitoring instructions: Patient is instructed to check her blood sugars 2 times a day, fasting and suppertime.  Call [facility] if: BG persistently < 70 or > 300. . I reviewed the Rule of 15 for the treatment of hypoglycemia in detail with the patient. Literature supplied.   2) Diabetic complications:   Eye: Does not have known diabetic retinopathy.   Neuro/ Feet: Does not have known diabetic peripheral neuropathy.  Renal: Patient does note have known baseline CKD. She is on an ACEI/ARB at present.   3) Lipids: Patient is on  Pravastatin   Follow-up in 4 months      Signed electronically by: Mack Guise, MD  [facility]  [address] [place], [place] [address] Phone: [phone] FAX: [phone]   CC: Antony Contras, MD [place] [address] Phone: [phone]  Fax: [phone]    Return to Endocrinology clinic as below: No future appointments.

## 2020-01-10 NOTE — Patient Instructions (Signed)
-   Metformin 1000 mg, 1 tablet with Breakfast and 1 tablet with supper - Start Glipizide 5 mg, 1 tablet before Breakfast        HOW TO TREAT LOW BLOOD SUGARS (Blood sugar LESS THAN 70 MG/DL)  Please follow the RULE OF 15 for the treatment of hypoglycemia treatment (when your (blood sugars are less than 70 mg/dL)    STEP 1: Take 15 grams of carbohydrates when your blood sugar is low, which includes:   3-4 GLUCOSE TABS  OR  3-4 OZ OF JUICE OR REGULAR SODA OR  ONE TUBE OF GLUCOSE GEL     STEP 2: RECHECK blood sugar in 15 MINUTES STEP 3: If your blood sugar is still low at the 15 minute recheck --> then, go back to STEP 1 and treat AGAIN with another 15 grams of carbohydrates.

## 2020-02-29 LAB — HM DIABETES EYE EXAM

## 2020-05-04 ENCOUNTER — Encounter: Payer: Self-pay | Admitting: Internal Medicine

## 2020-05-04 ENCOUNTER — Ambulatory Visit: Payer: Medicare Other | Admitting: Internal Medicine

## 2020-05-04 ENCOUNTER — Other Ambulatory Visit: Payer: Self-pay

## 2020-05-04 VITALS — BP 114/78 | HR 87 | Ht <= 58 in | Wt 156.8 lb

## 2020-05-04 DIAGNOSIS — E1165 Type 2 diabetes mellitus with hyperglycemia: Secondary | ICD-10-CM | POA: Diagnosis not present

## 2020-05-04 DIAGNOSIS — E119 Type 2 diabetes mellitus without complications: Secondary | ICD-10-CM | POA: Diagnosis not present

## 2020-05-04 MED ORDER — GLIPIZIDE 5 MG PO TABS: ORAL_TABLET | ORAL | 3 refills | Status: DC

## 2020-05-04 NOTE — Progress Notes (Signed)
Name: Kelly Farmer  Age/ Sex: 79 y.o., female   MRN/ DOB: [id number], 12/15/1940     PCP: Antony Contras, MD   Reason for Endocrinology Evaluation: Type 2 Diabetes Mellitus  Initial Endocrine Consultative Visit: 01/10/2020    PATIENT IDENTIFIER: Kelly Farmer is a 79 y.o. female with a past medical history of HTN,T2DM and Dyslipidemia. The patient has followed with Endocrinology clinic since 01/10/2020 for consultative assistance with management of her diabetes.  DIABETIC HISTORY:  Kelly Farmer was diagnosed with DM in 1970's. Januvia was cost prohibitive. Jardiance caused rash . Her hemoglobin A1c has ranged from 7.2% in 2019, peaking at 8.6% in 2020  On her initial visit to our clinic she had an A1c of 8.0% She was on metformin only. Glipizide was added  SUBJECTIVE:   During the last visit (01/10/2020): A1c 8.0%, we continued metformin and started Glipizide   Today (05/04/2020): Kelly Farmer  She checks her blood sugars 1 times daily, preprandial to breakfast . The patient has not had hypoglycemic episodes since the last clinic visit. Denies nausea or vomiting   Has LE swelling  Has an upcoming ENT appointment    HOME DIABETES REGIMEN:   Metformin 1000 mg, 1 tablet with Breakfast and 1 tablet with supper Glipizide 5 mg, 1 tablet before Breakfast     METER DOWNLOAD SUMMARY: Date range evaluated: 6/18-05/04/2020 Fingerstick Blood Glucose Tests = 14 Average Number Tests/Day = 1 Overall Mean FS Glucose = 188   BG Ranges: Low = 162 High = 244   Hypoglycemic Events/30 Days: BG < 50 = 0 Episodes of symptomatic severe hypoglycemia = 0    DIABETIC COMPLICATIONS: Microvascular complications:    Denies: neuropathy, retinopathy and CKD  Last eye exam: Completed 03/2019  Macrovascular complications:    Denies: CAD, PVD, CVA   HISTORY:  Past Medical History:  Past Medical History:  Diagnosis Date  . Abdominal pain    HOSP AT [facility] 02/13/12  TO  02/21/12  -DISCHARGE DX RUQ ABSCESS-MOST LIKELY GANGRENOUS CHLOLECYSTITIS WITH FISTULA TO THE COLON WITH ABSCESS;  DRAINAGE OF THE ABSCESS AND PLACEMENT OF DRAIN BY IR--PT STILL HAS THE DRAIN  TO DRAINAGE BAG AND IS ON ANTIBIOTIC  . Arthritis   . Diabetes mellitus    ORAL MED-NO INSULIN  . GERD (gastroesophageal reflux disease)    OCCAS  . Hypercholesteremia   . Hypertension    Past Surgical History:  Past Surgical History:  Procedure Laterality Date  . ABDOMINAL HYSTERECTOMY    . APPENDECTOMY    . CHOLECYSTECTOMY  04/10/2012   Procedure: CHOLECYSTECTOMY;  Surgeon: Earnstine Regal, MD;  Location: [facility];  Service: General;;  . COLOSTOMY REVISION  04/10/2012   Procedure: COLON RESECTION RIGHT;  Surgeon: Earnstine Regal, MD;  Location: [facility];  Service: General;;  . ESOPHAGOGASTRODUODENOSCOPY  02/14/2012   Procedure: ESOPHAGOGASTRODUODENOSCOPY (EGD);  Surgeon: Wonda Horner, MD;  Location: [facility];  Service: Endoscopy;  Laterality: N/A;  . LAPAROTOMY  04/10/2012   Procedure: EXPLORATORY LAPAROTOMY;  Surgeon: Earnstine Regal, MD;  Location: [facility];  Service: General;;    Social History:  reports that she has quit smoking. She has never used smokeless tobacco. She reports that she does not drink alcohol and does not use drugs. Family History:  Family History  Problem Relation Age of Onset  . Cancer Sister        breast  . Breast cancer Sister   . Breast cancer Sister   . Breast cancer Other   .  Breast cancer Other   . Breast cancer Other      HOME MEDICATIONS: Allergies as of 05/04/2020   No Known Allergies     Medication List       Accurate as of May 04, 2020  8:28 AM. If you have any questions, ask your nurse or doctor.        aspirin 325 MG tablet Take 325 mg by mouth daily with breakfast.   CALCIUM 600-D PO Take by mouth.   ezetimibe 10 MG tablet Commonly known as: ZETIA Take 10 mg by mouth daily. AT NIGHT   glipiZIDE 5 MG tablet Commonly known as: GLUCOTROL Take 1 tablet (5  mg total) by mouth daily before breakfast.   latanoprost 0.005 % ophthalmic solution Commonly known as: XALATAN INSTILL 1 DROP INTO BOTH EYES EVERY DAY AT NIGHT   metFORMIN 1000 MG tablet Commonly known as: GLUCOPHAGE Take 1,000 mg by mouth 2 (two) times daily with a meal.   multivitamin with minerals Tabs tablet Take 1 tablet by mouth daily.   ONE TOUCH ULTRA 2 w/Device Kit by Does not apply route. Once daily   pravastatin 20 MG tablet Commonly known as: PRAVACHOL Take 20 mg by mouth daily.   rosuvastatin 20 MG tablet Commonly known as: CRESTOR Take 20 mg by mouth daily. AT NIGHT   telmisartan-hydrochlorothiazide 80-12.5 MG tablet Commonly known as: MICARDIS HCT Take 1 tablet by mouth daily with breakfast.   triamcinolone ointment 0.1 % Commonly known as: KENALOG APPLY ON THE SKIN NIGHTLY APPLY TO RED ITCHY AREAS, TAPER AS ABLE        OBJECTIVE:   Vital Signs: BP 114/78 (BP Location: Left Arm, Patient Position: Sitting, Cuff Size: Large)   Pulse 87   Ht _0  (1.473 m)   Wt 156 lb 12.8 oz (71.1 kg)   SpO2 98%   BMI 32.77 kg/m   Wt Readings from Last 3 Encounters:  05/04/20 156 lb 12.8 oz (71.1 kg)  01/10/20 154 lb 9.6 oz (70.1 kg)  07/07/12 149 lb 9.6 oz (67.9 kg)     Exam: General: Pt appears well and is in NAD Bilateral parotid enlargement R<L   Lungs: Clear with good BS bilat with no rales, rhonchi, or wheezes  Heart: RRR   Extremities: Trace pretibial edema.   Neuro: MS is good with appropriate affect, pt is alert and Ox3   DM foot exam: 01/10/2020  The skin of the feet is intact without sores or ulcerations. The pedal pulses are 2+ on right and 2+ on left. The sensation is intact to a screening 5.07, 10 gram monofilament bilaterally    DATA REVIEWED:  Lab Results  Component Value Date   HGBA1C 8.0 (A) 01/10/2020   HGBA1C 6.9 (H) 04/11/2012   Lab Results  Component Value Date   CREATININE 0.52 04/17/2012    03/28/2020 A1c 7.8%     ASSESSMENT / PLAN / RECOMMENDATIONS:   1) Type 2 Diabetes Mellitus, Sub-OPtimally controlled, Without complications - Most recent A1c of 7.8 %. Goal A1c < 8.0 %.    - Improving glycemic control but she has been noted with late night hyperglycemia > 200's.  - I am going to add half a tablet of Glipizide before supper    MEDICATIONS:  Increase Glipizide 5 mg, 1 tablet before Breakfast and Half a tablet before Supper  Continue Metformin 1000 mg BID   EDUCATION / INSTRUCTIONS:  BG monitoring instructions: Patient is instructed to check her blood sugars 1  times a day, fasting .  Call [facility] if: BG persistently < 70 . Marland Kitchen I reviewed the Rule of 15 for the treatment of hypoglycemia in detail with the patient. Literature supplied.   2) Diabetic complications:   Eye: Does not have known diabetic retinopathy.   Neuro/ Feet: Does not have known diabetic peripheral neuropathy.  Renal: Patient does note have known baseline CKD. She is on an ACEI/ARB at present.   F/U in 4 months    Signed electronically by: Mack Guise, MD  [facility]  [address] [place], [place] [address] Phone: [phone] FAX: [phone]   CC: Antony Contras, MD [address] [place] [address] Phone: [phone]  Fax: [phone]  Return to Endocrinology clinic as below: Future Appointments  Date Time Provider [place]  05/04/2020  8:30 AM Lexii Walsh, Melanie Crazier, MD [id number] None

## 2020-05-04 NOTE — Patient Instructions (Addendum)
-   Metformin 1000 mg, 1 tablet with Breakfast and 1 tablet with supper - Glipizide 5 mg, 1 tablet before Breakfast , and HALF a  tablet before Supper      HOW TO TREAT LOW BLOOD SUGARS (Blood sugar LESS THAN 70 MG/DL) Please follow the RULE OF 15 for the treatment of hypoglycemia treatment (when your (blood sugars are less than 70 mg/dL)   STEP 1: Take 15 grams of carbohydrates when your blood sugar is low, which includes:  3-4 GLUCOSE TABS  OR 3-4 OZ OF JUICE OR REGULAR SODA OR ONE TUBE OF GLUCOSE GEL    STEP 2: RECHECK blood sugar in 15 MINUTES STEP 3: If your blood sugar is still low at the 15 minute recheck --> then, go back to STEP 1 and treat AGAIN with another 15 grams of carbohydrates. 

## 2020-05-09 ENCOUNTER — Ambulatory Visit: Payer: Medicare Other | Admitting: Internal Medicine

## 2020-07-31 ENCOUNTER — Other Ambulatory Visit: Payer: Self-pay | Admitting: Family Medicine

## 2020-07-31 DIAGNOSIS — Z1231 Encounter for screening mammogram for malignant neoplasm of breast: Secondary | ICD-10-CM

## 2020-09-04 ENCOUNTER — Ambulatory Visit: Payer: Medicare Other | Admitting: Internal Medicine

## 2020-09-11 ENCOUNTER — Encounter: Payer: Self-pay | Admitting: Internal Medicine

## 2020-09-11 ENCOUNTER — Ambulatory Visit (INDEPENDENT_AMBULATORY_CARE_PROVIDER_SITE_OTHER): Payer: Medicare Other | Admitting: Internal Medicine

## 2020-09-11 ENCOUNTER — Other Ambulatory Visit: Payer: Self-pay

## 2020-09-11 VITALS — BP 126/82 | HR 101 | Ht <= 58 in | Wt 154.2 lb

## 2020-09-11 DIAGNOSIS — E1165 Type 2 diabetes mellitus with hyperglycemia: Secondary | ICD-10-CM | POA: Diagnosis not present

## 2020-09-11 LAB — POCT GLYCOSYLATED HEMOGLOBIN (HGB A1C): Hemoglobin A1C: 6.8 % — AB (ref 4.0–5.6)

## 2020-09-11 NOTE — Progress Notes (Signed)
Name: Kelly Farmer  Age/ Sex: 79 y.o., female   MRN/ DOB: [id number], Feb 20, 1941     PCP: Antony Contras, MD   Reason for Endocrinology Evaluation: Type 2 Diabetes Mellitus  Initial Endocrine Consultative Visit: 01/10/2020    PATIENT IDENTIFIER: Kelly Farmer is a 79 y.o. female with a past medical history of HTN,T2DM and Dyslipidemia. The patient has followed with Endocrinology clinic since 01/10/2020 for consultative assistance with management of her diabetes.  DIABETIC HISTORY:  Kelly Farmer was diagnosed with DM in 1970's. Januvia was cost prohibitive. Jardiance caused rash . Her hemoglobin A1c has ranged from 7.2% in 2019, peaking at 8.6% in 2020  On her initial visit to our clinic she had an A1c of 8.0% She was on metformin only. Glipizide was added  SUBJECTIVE:   During the last visit (05/04/2020): A1c 7.8%, we continued metformin and increased Glipizide      Today (09/12/2020): Kelly Farmer  She checks her blood sugars 1 times daily, preprandial to breakfast . The patient has not had hypoglycemic episodes since the last clinic visit. Denies nausea or vomiting     HOME DIABETES REGIMEN:   Metformin 1000 mg, 1 tablet with Breakfast and 1 tablet with supper Glipizide 5 mg,  1 tablet before Breakfast and Half a tablet before Supper     METER DOWNLOAD SUMMARY: Date range evaluated: 10/10-11/06/2020 Fingerstick Blood Glucose Tests = 30 Average Number Tests/Day = 1 Overall Mean FS Glucose = 168   BG Ranges: Low = 116 High = 208   Hypoglycemic Events/30 Days: BG < 50 = 0 Episodes of symptomatic severe hypoglycemia = 0    DIABETIC COMPLICATIONS: Microvascular complications:    Denies: neuropathy, retinopathy and CKD  Last eye exam: Completed 03/2019  Macrovascular complications:    Denies: CAD, PVD, CVA   HISTORY:  Past Medical History:  Past Medical History:  Diagnosis Date  . Abdominal pain    HOSP AT [facility] 02/13/12  TO  02/21/12  -DISCHARGE DX RUQ ABSCESS-MOST LIKELY GANGRENOUS CHLOLECYSTITIS WITH FISTULA TO THE COLON WITH ABSCESS;  DRAINAGE OF THE ABSCESS AND PLACEMENT OF DRAIN BY IR--PT STILL HAS THE DRAIN  TO DRAINAGE BAG AND IS ON ANTIBIOTIC  . Arthritis   . Diabetes mellitus    ORAL MED-NO INSULIN  . GERD (gastroesophageal reflux disease)    OCCAS  . Hypercholesteremia   . Hypertension    Past Surgical History:  Past Surgical History:  Procedure Laterality Date  . ABDOMINAL HYSTERECTOMY    . APPENDECTOMY    . CHOLECYSTECTOMY  04/10/2012   Procedure: CHOLECYSTECTOMY;  Surgeon: Earnstine Regal, MD;  [facility]: [facility];  Service: General;;  . COLOSTOMY REVISION  04/10/2012   Procedure: COLON RESECTION RIGHT;  Surgeon: Earnstine Regal, MD;  [facility];  Service: General;;  . ESOPHAGOGASTRODUODENOSCOPY  02/14/2012   Procedure: ESOPHAGOGASTRODUODENOSCOPY (EGD);  Surgeon: Wonda Horner, MD;  Location: [facility];  Service: Endoscopy;  Laterality: N/A;  . LAPAROTOMY  04/10/2012   Procedure: EXPLORATORY LAPAROTOMY;  Surgeon: Earnstine Regal, MD;  [facility];  Service: General;;    Social History:  reports that she has quit smoking. She has never used smokeless tobacco. She reports that she does not drink alcohol and does not use drugs. Family History:  Family History  Problem Relation Age of Onset  . Cancer Sister        breast  . Breast cancer Sister   . Breast cancer Sister   . Breast cancer Other   .  Breast cancer Other   . Breast cancer Other      HOME MEDICATIONS: Allergies as of 09/11/2020   No Known Allergies     Medication List       Accurate as of September 11, 2020 11:59 PM. If you have any questions, ask your nurse or doctor.        STOP taking these medications   rosuvastatin 20 MG tablet Commonly known as: CRESTOR Stopped by: Dorita Sciara, MD     TAKE these medications   aspirin 325 MG tablet Take 325 mg by mouth daily with breakfast.   CALCIUM 600-D PO Take by  mouth.   ezetimibe 10 MG tablet Commonly known as: ZETIA Take 10 mg by mouth daily. AT NIGHT   glipiZIDE 5 MG tablet Commonly known as: GLUCOTROL Take 1 tablet (5 mg total) by mouth daily before breakfast AND 0.5 tablets (2.5 mg total) daily before supper.   latanoprost 0.005 % ophthalmic solution Commonly known as: XALATAN INSTILL 1 DROP INTO BOTH EYES EVERY DAY AT NIGHT   metFORMIN 1000 MG tablet Commonly known as: GLUCOPHAGE Take 1,000 mg by mouth 2 (two) times daily with a meal.   multivitamin with minerals Tabs tablet Take 1 tablet by mouth daily.   ONE TOUCH ULTRA 2 w/Device Kit by Does not apply route. Once daily   pravastatin 40 MG tablet Commonly known as: PRAVACHOL Take 40 mg by mouth daily. What changed: Another medication with the same name was removed. Continue taking this medication, and follow the directions you see here. Changed by: Dorita Sciara, MD   telmisartan-hydrochlorothiazide 80-12.5 MG tablet Commonly known as: MICARDIS HCT Take 1 tablet by mouth daily with breakfast.   triamcinolone ointment 0.1 % Commonly known as: KENALOG APPLY ON THE SKIN NIGHTLY APPLY TO RED ITCHY AREAS, TAPER AS ABLE        OBJECTIVE:   Vital Signs: BP 126/82   Pulse (!) 101   Ht 4' 10" (1.473 m)   Wt 154 lb 4 oz (70 kg)   SpO2 98%   BMI 32.24 kg/m   Wt Readings from Last 3 Encounters:  09/11/20 154 lb 4 oz (70 kg)  05/04/20 156 lb 12.8 oz (71.1 kg)  01/10/20 154 lb 9.6 oz (70.1 kg)     Exam: General: Pt appears well and is in NAD Bilateral parotid enlargement R<L   Lungs: Clear with good BS bilat with no rales, rhonchi, or wheezes  Heart: RRR   Extremities: Trace pretibial edema.   Neuro: MS is good with appropriate affect, pt is alert and Ox3   DM foot exam: 01/10/2020  The skin of the feet is intact without sores or ulcerations. The pedal pulses are 2+ on right and 2+ on left. The sensation is intact to a screening 5.07, 10 gram monofilament  bilaterally    DATA REVIEWED:  Lab Results  Component Value Date   HGBA1C 6.8 (A) 09/11/2020   HGBA1C 8.0 (A) 01/10/2020   HGBA1C 6.9 (H) 04/11/2012   Lab Results  Component Value Date   CREATININE 0.52 04/17/2012    03/28/2020 A1c 7.8%    ASSESSMENT / PLAN / RECOMMENDATIONS:   1) Type 2 Diabetes Mellitus,OPtimally controlled, Without complications - Most recent A1c of 6.8 %. Goal A1c < 8.0 %.    -Praised the pt on optimizing glucose control, no evidence of hypoglycemia. - No changes will be made today   MEDICATIONS:  Continue Glipizide 5 mg, 1 tablet before Breakfast  and Half a tablet before Supper  Continue Metformin 1000 mg BID   EDUCATION / INSTRUCTIONS:  BG monitoring instructions: Patient is instructed to check her blood sugars 1 times a day, fasting .  Call [facility] if: BG persistently < 70 . Marland Kitchen I reviewed the Rule of 15 for the treatment of hypoglycemia in detail with the patient. Literature supplied.   2) Diabetic complications:   Eye: Does not have known diabetic retinopathy.   Neuro/ Feet: Does not have known diabetic peripheral neuropathy.  Renal: Patient does note have known baseline CKD. She is on an ACEI/ARB at present.   F/U in 6 months    Signed electronically by: Mack Guise, MD  [facility] Endocrinology  [facility] [address] [place], [place] [address] Phone: [phone] FAX: [phone]   CC: Antony Contras, MD [address] [place] [address] Phone: [phone]  Fax: [phone]  Return to Endocrinology clinic as below: Future Appointments  Date Time Provider [place]  10/09/2020 10:00 AM [facility] MM 2 [facility]  03/12/2021  2:40 PM , Melanie Crazier, MD [id number] None

## 2020-09-11 NOTE — Patient Instructions (Signed)
-   Metformin 1000 mg, 1 tablet with Breakfast and 1 tablet with supper - Glipizide 5 mg, 1 tablet before Breakfast , and HALF a  tablet before Supper      HOW TO TREAT LOW BLOOD SUGARS (Blood sugar LESS THAN 70 MG/DL) Please follow the RULE OF 15 for the treatment of hypoglycemia treatment (when your (blood sugars are less than 70 mg/dL)   STEP 1: Take 15 grams of carbohydrates when your blood sugar is low, which includes:  3-4 GLUCOSE TABS  OR 3-4 OZ OF JUICE OR REGULAR SODA OR ONE TUBE OF GLUCOSE GEL    STEP 2: RECHECK blood sugar in 15 MINUTES STEP 3: If your blood sugar is still low at the 15 minute recheck --> then, go back to STEP 1 and treat AGAIN with another 15 grams of carbohydrates. 

## 2020-09-12 ENCOUNTER — Ambulatory Visit: Payer: Medicare Other

## 2020-10-04 ENCOUNTER — Other Ambulatory Visit: Payer: Self-pay | Admitting: Family Medicine

## 2020-10-04 DIAGNOSIS — M85851 Other specified disorders of bone density and structure, right thigh: Secondary | ICD-10-CM

## 2020-10-09 ENCOUNTER — Other Ambulatory Visit: Payer: Self-pay

## 2020-10-09 ENCOUNTER — Ambulatory Visit
Admission: RE | Admit: 2020-10-09 | Discharge: 2020-10-09 | Disposition: A | Discharge status: Home / Self Care | Payer: Medicare Other | Source: Ambulatory Visit | Attending: Family Medicine | Admitting: Family Medicine

## 2020-10-09 DIAGNOSIS — Z1231 Encounter for screening mammogram for malignant neoplasm of breast: Secondary | ICD-10-CM

## 2020-11-09 DIAGNOSIS — I10 Essential (primary) hypertension: Secondary | ICD-10-CM | POA: Diagnosis not present

## 2020-11-09 DIAGNOSIS — E782 Mixed hyperlipidemia: Secondary | ICD-10-CM | POA: Diagnosis not present

## 2020-11-09 DIAGNOSIS — E1169 Type 2 diabetes mellitus with other specified complication: Secondary | ICD-10-CM | POA: Diagnosis not present

## 2020-11-09 DIAGNOSIS — E119 Type 2 diabetes mellitus without complications: Secondary | ICD-10-CM | POA: Diagnosis not present

## 2020-11-09 DIAGNOSIS — D649 Anemia, unspecified: Secondary | ICD-10-CM | POA: Diagnosis not present

## 2020-11-09 DIAGNOSIS — E1165 Type 2 diabetes mellitus with hyperglycemia: Secondary | ICD-10-CM | POA: Diagnosis not present

## 2020-11-09 DIAGNOSIS — K219 Gastro-esophageal reflux disease without esophagitis: Secondary | ICD-10-CM | POA: Diagnosis not present

## 2020-11-09 DIAGNOSIS — H409 Unspecified glaucoma: Secondary | ICD-10-CM | POA: Diagnosis not present

## 2020-12-04 DIAGNOSIS — H401131 Primary open-angle glaucoma, bilateral, mild stage: Secondary | ICD-10-CM | POA: Diagnosis not present

## 2021-01-01 DIAGNOSIS — E1165 Type 2 diabetes mellitus with hyperglycemia: Secondary | ICD-10-CM | POA: Diagnosis not present

## 2021-01-01 DIAGNOSIS — H409 Unspecified glaucoma: Secondary | ICD-10-CM | POA: Diagnosis not present

## 2021-01-01 DIAGNOSIS — I10 Essential (primary) hypertension: Secondary | ICD-10-CM | POA: Diagnosis not present

## 2021-01-01 DIAGNOSIS — E782 Mixed hyperlipidemia: Secondary | ICD-10-CM | POA: Diagnosis not present

## 2021-01-01 DIAGNOSIS — D649 Anemia, unspecified: Secondary | ICD-10-CM | POA: Diagnosis not present

## 2021-01-01 DIAGNOSIS — E1169 Type 2 diabetes mellitus with other specified complication: Secondary | ICD-10-CM | POA: Diagnosis not present

## 2021-01-01 DIAGNOSIS — K219 Gastro-esophageal reflux disease without esophagitis: Secondary | ICD-10-CM | POA: Diagnosis not present

## 2021-01-01 DIAGNOSIS — E119 Type 2 diabetes mellitus without complications: Secondary | ICD-10-CM | POA: Diagnosis not present

## 2021-01-10 ENCOUNTER — Other Ambulatory Visit: Payer: Medicare Other

## 2021-01-24 DIAGNOSIS — E119 Type 2 diabetes mellitus without complications: Secondary | ICD-10-CM | POA: Diagnosis not present

## 2021-01-30 DIAGNOSIS — E1169 Type 2 diabetes mellitus with other specified complication: Secondary | ICD-10-CM | POA: Diagnosis not present

## 2021-01-30 DIAGNOSIS — E1165 Type 2 diabetes mellitus with hyperglycemia: Secondary | ICD-10-CM | POA: Diagnosis not present

## 2021-01-30 DIAGNOSIS — H409 Unspecified glaucoma: Secondary | ICD-10-CM | POA: Diagnosis not present

## 2021-01-30 DIAGNOSIS — E119 Type 2 diabetes mellitus without complications: Secondary | ICD-10-CM | POA: Diagnosis not present

## 2021-01-30 DIAGNOSIS — D649 Anemia, unspecified: Secondary | ICD-10-CM | POA: Diagnosis not present

## 2021-01-30 DIAGNOSIS — E782 Mixed hyperlipidemia: Secondary | ICD-10-CM | POA: Diagnosis not present

## 2021-01-30 DIAGNOSIS — I10 Essential (primary) hypertension: Secondary | ICD-10-CM | POA: Diagnosis not present

## 2021-01-30 DIAGNOSIS — K219 Gastro-esophageal reflux disease without esophagitis: Secondary | ICD-10-CM | POA: Diagnosis not present

## 2021-03-05 DIAGNOSIS — E119 Type 2 diabetes mellitus without complications: Secondary | ICD-10-CM | POA: Diagnosis not present

## 2021-03-05 DIAGNOSIS — H401131 Primary open-angle glaucoma, bilateral, mild stage: Secondary | ICD-10-CM | POA: Diagnosis not present

## 2021-03-05 DIAGNOSIS — H2513 Age-related nuclear cataract, bilateral: Secondary | ICD-10-CM | POA: Diagnosis not present

## 2021-03-05 DIAGNOSIS — H25013 Cortical age-related cataract, bilateral: Secondary | ICD-10-CM | POA: Diagnosis not present

## 2021-03-12 ENCOUNTER — Encounter: Payer: Self-pay | Admitting: Internal Medicine

## 2021-03-12 ENCOUNTER — Other Ambulatory Visit: Payer: Self-pay

## 2021-03-12 ENCOUNTER — Ambulatory Visit (INDEPENDENT_AMBULATORY_CARE_PROVIDER_SITE_OTHER): Payer: Medicare Other | Admitting: Internal Medicine

## 2021-03-12 VITALS — BP 124/74 | HR 93 | Ht <= 58 in | Wt 153.1 lb

## 2021-03-12 DIAGNOSIS — E1165 Type 2 diabetes mellitus with hyperglycemia: Secondary | ICD-10-CM | POA: Diagnosis not present

## 2021-03-12 LAB — POCT GLYCOSYLATED HEMOGLOBIN (HGB A1C): Hemoglobin A1C: 7 % — AB (ref 4.0–5.6)

## 2021-03-12 NOTE — Progress Notes (Signed)
Name: Kelly Farmer  Age/ Sex: 80 y.o., female   MRN/ DOB: [id number], 09-20-1941     PCP: Antony Contras, MD   Reason for Endocrinology Evaluation: Type 2 Diabetes Mellitus  Initial Endocrine Consultative Visit: 01/10/2020    PATIENT IDENTIFIER: Kelly Farmer is a 80 y.o. female with a past medical history of HTN,T2DM and Dyslipidemia. The patient has followed with Endocrinology clinic since 01/10/2020 for consultative assistance with management of her diabetes.  DIABETIC HISTORY:  Kelly Farmer was diagnosed with DM in 1970's. Januvia was cost prohibitive. Jardiance caused rash . Her hemoglobin A1c has ranged from 7.2% in 2019, peaking at 8.6% in 2020  On her initial visit to our clinic she had an A1c of 8.0% She was on metformin only. Glipizide was added  SUBJECTIVE:   During the last visit (09/11/2020): A1c 6.8%, we continued metformin and  Glipizide      Today (09/12/2020): Kelly Farmer  She checks her blood sugars 1 times daily, preprandial to breakfast . The patient has not had hypoglycemic episodes since the last clinic visit.   Denies nausea or vomiting     HOME DIABETES REGIMEN:   Metformin 1000 mg, 1 tablet with Breakfast and 1 tablet with supper Glipizide 5 mg,  1 tablet before Breakfast and Half a tablet before Supper     METER DOWNLOAD SUMMARY: Date range evaluated: 4/25-03/12/2021  Average Number Tests/Day = 1.1 Overall Mean FS Glucose = 177   BG Ranges: Low = 146 High = 206   Hypoglycemic Events/30 Days: BG < 50 = 0 Episodes of symptomatic severe hypoglycemia = 0    DIABETIC COMPLICATIONS: Microvascular complications:    Denies: neuropathy, retinopathy and CKD  Last eye exam: Completed 03/2021  Macrovascular complications:    Denies: CAD, PVD, CVA   HISTORY:  Past Medical History:  Past Medical History:  Diagnosis Date  . Abdominal pain    HOSP AT [facility] 02/13/12  TO  02/21/12 -DISCHARGE DX RUQ ABSCESS-MOST LIKELY GANGRENOUS  CHLOLECYSTITIS WITH FISTULA TO THE COLON WITH ABSCESS;  DRAINAGE OF THE ABSCESS AND PLACEMENT OF DRAIN BY IR--PT STILL HAS THE DRAIN  TO DRAINAGE BAG AND IS ON ANTIBIOTIC  . Arthritis   . Diabetes mellitus    ORAL MED-NO INSULIN  . GERD (gastroesophageal reflux disease)    OCCAS  . Hypercholesteremia   . Hypertension    Past Surgical History:  Past Surgical History:  Procedure Laterality Date  . ABDOMINAL HYSTERECTOMY    . APPENDECTOMY    . CHOLECYSTECTOMY  04/10/2012   Procedure: CHOLECYSTECTOMY;  Surgeon: Earnstine Regal, MD;  [facility];  Service: General;;  . COLOSTOMY REVISION  04/10/2012   Procedure: COLON RESECTION RIGHT;  Surgeon: Earnstine Regal, MD;  [facility];  Service: General;;  . ESOPHAGOGASTRODUODENOSCOPY  02/14/2012   Procedure: ESOPHAGOGASTRODUODENOSCOPY (EGD);  Surgeon: Wonda Horner, MD;  Location: [facility];  Service: Endoscopy;  Laterality: N/A;  . LAPAROTOMY  04/10/2012   Procedure: EXPLORATORY LAPAROTOMY;  Surgeon: Earnstine Regal, MD;  [facility];  Service: General;;    Social History:  reports that she has quit smoking. She has never used smokeless tobacco. She reports that she does not drink alcohol and does not use drugs. Family History:  Family History  Problem Relation Age of Onset  . Cancer Sister        breast  . Breast cancer Sister   . Breast cancer Sister   . Breast cancer Other   .  Breast cancer Other   . Breast cancer Other      HOME MEDICATIONS: Allergies as of 03/12/2021   No Known Allergies     Medication List       Accurate as of Mar 12, 2021  2:53 PM. If you have any questions, ask your nurse or doctor.        aspirin 325 MG tablet Take 325 mg by mouth daily with breakfast.   CALCIUM 600-D PO Take by mouth.   ezetimibe 10 MG tablet Commonly known as: ZETIA Take 10 mg by mouth daily. AT NIGHT   glipiZIDE 5 MG tablet Commonly known as: GLUCOTROL Take 1 tablet (5 mg total) by mouth daily before breakfast AND 0.5  tablets (2.5 mg total) daily before supper.   latanoprost 0.005 % ophthalmic solution Commonly known as: XALATAN INSTILL 1 DROP INTO BOTH EYES EVERY DAY AT NIGHT   metFORMIN 1000 MG tablet Commonly known as: GLUCOPHAGE Take 1,000 mg by mouth 2 (two) times daily with a meal.   multivitamin with minerals Tabs tablet Take 1 tablet by mouth daily.   ONE TOUCH ULTRA 2 w/Device Kit by Does not apply route. Once daily   pravastatin 40 MG tablet Commonly known as: PRAVACHOL Take 40 mg by mouth daily.   telmisartan-hydrochlorothiazide 80-12.5 MG tablet Commonly known as: MICARDIS HCT Take 1 tablet by mouth daily with breakfast.   triamcinolone ointment 0.1 % Commonly known as: KENALOG APPLY ON THE SKIN NIGHTLY APPLY TO RED ITCHY AREAS, TAPER AS ABLE        OBJECTIVE:   Vital Signs: BP 124/74   Pulse 93   Ht _0  (1.473 m)   Wt 153 lb 2 oz (69.5 kg)   SpO2 99%   BMI 32.00 kg/m   Wt Readings from Last 3 Encounters:  03/12/21 153 lb 2 oz (69.5 kg)  09/11/20 154 lb 4 oz (70 kg)  05/04/20 156 lb 12.8 oz (71.1 kg)     Exam: General: Pt appears well and is in NAD  Lungs: Clear with good BS bilat with no rales, rhonchi, or wheezes  Heart: RRR   Extremities: No  pretibial edema.   Neuro: MS is good with appropriate affect, pt is alert and Ox3   DM foot exam: 03/12/2021  The skin of the feet is intact without sores or ulcerations. The pedal pulses are 2+ on right and 2+ on left. The sensation is intact to a screening 5.07, 10 gram monofilament bilaterally    DATA REVIEWED:  Lab Results  Component Value Date   HGBA1C 7.0 (A) 03/12/2021   HGBA1C 6.8 (A) 09/11/2020   HGBA1C 8.0 (A) 01/10/2020   10/06/2020 BUN/Cr 14/0.690 LDL 134 HDL 76 Tg 95    ASSESSMENT / PLAN / RECOMMENDATIONS:   1) Type 2 Diabetes Mellitus,OPtimally controlled, Without complications - Most recent A1c of 7.0 %. Goal A1c < 8.0 %.    -Praised the pt on optimizing glucose control, no  evidence of hypoglycemia. - No changes will be made today   MEDICATIONS:  Continue Glipizide 5 mg, 1 tablet before Breakfast and Half a tablet before Supper  Continue Metformin 1000 mg BID   EDUCATION / INSTRUCTIONS:  BG monitoring instructions: Patient is instructed to check her blood sugars 1 times a day, fasting .  Call [facility] if: BG persistently < 70 . Marland Kitchen I reviewed the Rule of 15 for the treatment of hypoglycemia in detail with the patient. Literature supplied.   2)  Diabetic complications:   Eye: Does not have known diabetic retinopathy.   Neuro/ Feet: Does not have known diabetic peripheral neuropathy.  Renal: Patient does note have known baseline CKD. She is on an ACEI/ARB at present.   F/U in 6 months    Signed electronically by: Mack Guise, MD  [facility] Endocrin[facility]  [facility] [address] [place], [place] [address] Phone: [phone] FAX: [phone]   CC: Antony Contras, MD [address] [place] [address] Phone: [phone]  Fax: [phone]  Return to Endocrinology clinic as below: Future Appointments  Date Time Provider [place]  06/19/2021  1:00 PM [facility] DX DEXA 1 [facility]

## 2021-03-12 NOTE — Patient Instructions (Addendum)
-   Metformin 1000 mg, 1 tablet with Breakfast and 1 tablet with supper - Glipizide 5 mg, 1 tablet before Breakfast , and HALF a  tablet before Supper      HOW TO TREAT LOW BLOOD SUGARS (Blood sugar LESS THAN 70 MG/DL) Please follow the RULE OF 15 for the treatment of hypoglycemia treatment (when your (blood sugars are less than 70 mg/dL)   STEP 1: Take 15 grams of carbohydrates when your blood sugar is low, which includes:  3-4 GLUCOSE TABS  OR 3-4 OZ OF JUICE OR REGULAR SODA OR ONE TUBE OF GLUCOSE GEL    STEP 2: RECHECK blood sugar in 15 MINUTES STEP 3: If your blood sugar is still low at the 15 minute recheck --> then, go back to STEP 1 and treat AGAIN with another 15 grams of carbohydrates. 

## 2021-04-16 DIAGNOSIS — E782 Mixed hyperlipidemia: Secondary | ICD-10-CM | POA: Diagnosis not present

## 2021-04-16 DIAGNOSIS — I10 Essential (primary) hypertension: Secondary | ICD-10-CM | POA: Diagnosis not present

## 2021-04-16 DIAGNOSIS — Z7984 Long term (current) use of oral hypoglycemic drugs: Secondary | ICD-10-CM | POA: Diagnosis not present

## 2021-04-16 DIAGNOSIS — E1169 Type 2 diabetes mellitus with other specified complication: Secondary | ICD-10-CM | POA: Diagnosis not present

## 2021-04-16 DIAGNOSIS — Z0000 Encounter for general adult medical examination without abnormal findings: Secondary | ICD-10-CM | POA: Diagnosis not present

## 2021-04-16 DIAGNOSIS — M85851 Other specified disorders of bone density and structure, right thigh: Secondary | ICD-10-CM | POA: Diagnosis not present

## 2021-04-16 DIAGNOSIS — M353 Polymyalgia rheumatica: Secondary | ICD-10-CM | POA: Diagnosis not present

## 2021-04-16 DIAGNOSIS — K219 Gastro-esophageal reflux disease without esophagitis: Secondary | ICD-10-CM | POA: Diagnosis not present

## 2021-04-16 DIAGNOSIS — Z1389 Encounter for screening for other disorder: Secondary | ICD-10-CM | POA: Diagnosis not present

## 2021-04-17 DIAGNOSIS — I10 Essential (primary) hypertension: Secondary | ICD-10-CM | POA: Diagnosis not present

## 2021-04-17 DIAGNOSIS — E119 Type 2 diabetes mellitus without complications: Secondary | ICD-10-CM | POA: Diagnosis not present

## 2021-04-17 DIAGNOSIS — E1165 Type 2 diabetes mellitus with hyperglycemia: Secondary | ICD-10-CM | POA: Diagnosis not present

## 2021-04-17 DIAGNOSIS — D649 Anemia, unspecified: Secondary | ICD-10-CM | POA: Diagnosis not present

## 2021-04-17 DIAGNOSIS — K219 Gastro-esophageal reflux disease without esophagitis: Secondary | ICD-10-CM | POA: Diagnosis not present

## 2021-04-17 DIAGNOSIS — E782 Mixed hyperlipidemia: Secondary | ICD-10-CM | POA: Diagnosis not present

## 2021-04-17 DIAGNOSIS — E1169 Type 2 diabetes mellitus with other specified complication: Secondary | ICD-10-CM | POA: Diagnosis not present

## 2021-04-17 DIAGNOSIS — H409 Unspecified glaucoma: Secondary | ICD-10-CM | POA: Diagnosis not present

## 2021-04-19 DIAGNOSIS — E119 Type 2 diabetes mellitus without complications: Secondary | ICD-10-CM | POA: Diagnosis not present

## 2021-05-31 DIAGNOSIS — I10 Essential (primary) hypertension: Secondary | ICD-10-CM | POA: Diagnosis not present

## 2021-06-01 DIAGNOSIS — E1169 Type 2 diabetes mellitus with other specified complication: Secondary | ICD-10-CM | POA: Diagnosis not present

## 2021-06-01 DIAGNOSIS — D649 Anemia, unspecified: Secondary | ICD-10-CM | POA: Diagnosis not present

## 2021-06-01 DIAGNOSIS — E119 Type 2 diabetes mellitus without complications: Secondary | ICD-10-CM | POA: Diagnosis not present

## 2021-06-01 DIAGNOSIS — E782 Mixed hyperlipidemia: Secondary | ICD-10-CM | POA: Diagnosis not present

## 2021-06-01 DIAGNOSIS — H409 Unspecified glaucoma: Secondary | ICD-10-CM | POA: Diagnosis not present

## 2021-06-01 DIAGNOSIS — E1165 Type 2 diabetes mellitus with hyperglycemia: Secondary | ICD-10-CM | POA: Diagnosis not present

## 2021-06-01 DIAGNOSIS — I10 Essential (primary) hypertension: Secondary | ICD-10-CM | POA: Diagnosis not present

## 2021-06-01 DIAGNOSIS — K219 Gastro-esophageal reflux disease without esophagitis: Secondary | ICD-10-CM | POA: Diagnosis not present

## 2021-06-08 DIAGNOSIS — H409 Unspecified glaucoma: Secondary | ICD-10-CM | POA: Diagnosis not present

## 2021-06-08 DIAGNOSIS — E1169 Type 2 diabetes mellitus with other specified complication: Secondary | ICD-10-CM | POA: Diagnosis not present

## 2021-06-08 DIAGNOSIS — D649 Anemia, unspecified: Secondary | ICD-10-CM | POA: Diagnosis not present

## 2021-06-08 DIAGNOSIS — E782 Mixed hyperlipidemia: Secondary | ICD-10-CM | POA: Diagnosis not present

## 2021-06-08 DIAGNOSIS — E119 Type 2 diabetes mellitus without complications: Secondary | ICD-10-CM | POA: Diagnosis not present

## 2021-06-08 DIAGNOSIS — K219 Gastro-esophageal reflux disease without esophagitis: Secondary | ICD-10-CM | POA: Diagnosis not present

## 2021-06-08 DIAGNOSIS — I10 Essential (primary) hypertension: Secondary | ICD-10-CM | POA: Diagnosis not present

## 2021-06-08 DIAGNOSIS — E1165 Type 2 diabetes mellitus with hyperglycemia: Secondary | ICD-10-CM | POA: Diagnosis not present

## 2021-06-11 DIAGNOSIS — H401131 Primary open-angle glaucoma, bilateral, mild stage: Secondary | ICD-10-CM | POA: Diagnosis not present

## 2021-06-19 ENCOUNTER — Ambulatory Visit
Admission: RE | Admit: 2021-06-19 | Discharge: 2021-06-19 | Disposition: A | Discharge status: Home / Self Care | Payer: Medicare Other | Source: Ambulatory Visit | Attending: Family Medicine | Admitting: Family Medicine

## 2021-06-19 ENCOUNTER — Other Ambulatory Visit: Payer: Self-pay

## 2021-06-19 DIAGNOSIS — M85832 Other specified disorders of bone density and structure, left forearm: Secondary | ICD-10-CM | POA: Diagnosis not present

## 2021-06-19 DIAGNOSIS — Z780 Asymptomatic menopausal state: Secondary | ICD-10-CM | POA: Diagnosis not present

## 2021-06-19 DIAGNOSIS — M85851 Other specified disorders of bone density and structure, right thigh: Secondary | ICD-10-CM

## 2021-07-04 DIAGNOSIS — I10 Essential (primary) hypertension: Secondary | ICD-10-CM | POA: Diagnosis not present

## 2021-07-10 DIAGNOSIS — D649 Anemia, unspecified: Secondary | ICD-10-CM | POA: Diagnosis not present

## 2021-07-10 DIAGNOSIS — H409 Unspecified glaucoma: Secondary | ICD-10-CM | POA: Diagnosis not present

## 2021-07-10 DIAGNOSIS — I10 Essential (primary) hypertension: Secondary | ICD-10-CM | POA: Diagnosis not present

## 2021-07-10 DIAGNOSIS — E1169 Type 2 diabetes mellitus with other specified complication: Secondary | ICD-10-CM | POA: Diagnosis not present

## 2021-07-10 DIAGNOSIS — E119 Type 2 diabetes mellitus without complications: Secondary | ICD-10-CM | POA: Diagnosis not present

## 2021-07-10 DIAGNOSIS — E1165 Type 2 diabetes mellitus with hyperglycemia: Secondary | ICD-10-CM | POA: Diagnosis not present

## 2021-07-10 DIAGNOSIS — K219 Gastro-esophageal reflux disease without esophagitis: Secondary | ICD-10-CM | POA: Diagnosis not present

## 2021-07-10 DIAGNOSIS — E782 Mixed hyperlipidemia: Secondary | ICD-10-CM | POA: Diagnosis not present

## 2021-07-11 DIAGNOSIS — E119 Type 2 diabetes mellitus without complications: Secondary | ICD-10-CM | POA: Diagnosis not present

## 2021-07-12 ENCOUNTER — Other Ambulatory Visit: Payer: Self-pay | Admitting: Internal Medicine

## 2021-07-27 DIAGNOSIS — E119 Type 2 diabetes mellitus without complications: Secondary | ICD-10-CM | POA: Diagnosis not present

## 2021-08-30 ENCOUNTER — Other Ambulatory Visit: Payer: Self-pay | Admitting: Family Medicine

## 2021-08-30 DIAGNOSIS — Z1231 Encounter for screening mammogram for malignant neoplasm of breast: Secondary | ICD-10-CM

## 2021-08-31 DIAGNOSIS — I10 Essential (primary) hypertension: Secondary | ICD-10-CM | POA: Diagnosis not present

## 2021-09-11 DIAGNOSIS — H401131 Primary open-angle glaucoma, bilateral, mild stage: Secondary | ICD-10-CM | POA: Diagnosis not present

## 2021-09-17 ENCOUNTER — Other Ambulatory Visit: Payer: Self-pay

## 2021-09-17 ENCOUNTER — Ambulatory Visit (INDEPENDENT_AMBULATORY_CARE_PROVIDER_SITE_OTHER): Payer: Medicare Other | Admitting: Internal Medicine

## 2021-09-17 VITALS — BP 120/70 | HR 74 | Ht <= 58 in | Wt 152.0 lb

## 2021-09-17 DIAGNOSIS — E1165 Type 2 diabetes mellitus with hyperglycemia: Secondary | ICD-10-CM | POA: Diagnosis not present

## 2021-09-17 LAB — POCT GLYCOSYLATED HEMOGLOBIN (HGB A1C): Hemoglobin A1C: 7.2 % — AB (ref 4.0–5.6)

## 2021-09-17 MED ORDER — METFORMIN HCL 1000 MG PO TABS: 1000.0000 mg | ORAL_TABLET | Freq: Two times a day (BID) | ORAL | 3 refills | Status: DC

## 2021-09-17 MED ORDER — GLIPIZIDE 5 MG PO TABS: ORAL_TABLET | ORAL | 3 refills | Status: DC

## 2021-09-17 NOTE — Patient Instructions (Signed)
-   Metformin 1000 mg, 1 tablet with Breakfast and 1 tablet with supper - Glipizide 5 mg, 1 tablet before Breakfast , and HALF a  tablet before Supper      HOW TO TREAT LOW BLOOD SUGARS (Blood sugar LESS THAN 70 MG/DL) Please follow the RULE OF 15 for the treatment of hypoglycemia treatment (when your (blood sugars are less than 70 mg/dL)   STEP 1: Take 15 grams of carbohydrates when your blood sugar is low, which includes:  3-4 GLUCOSE TABS  OR 3-4 OZ OF JUICE OR REGULAR SODA OR ONE TUBE OF GLUCOSE GEL    STEP 2: RECHECK blood sugar in 15 MINUTES STEP 3: If your blood sugar is still low at the 15 minute recheck --> then, go back to STEP 1 and treat AGAIN with another 15 grams of carbohydrates. 

## 2021-09-17 NOTE — Progress Notes (Signed)
Name: Kelly Farmer  Age/ Sex: 80 y.o., female   MRN/ DOB: [id number], 1941-06-03     PCP: Antony Contras, MD   Reason for Endocrinology Evaluation: Type 2 Diabetes Mellitus  Initial Endocrine Consultative Visit: 01/10/2020    PATIENT IDENTIFIER: [name] is a 80 y.o. female with a past medical history of HTN,T2DM and Dyslipidemia. The patient has followed with Endocrinology clinic since 01/10/2020 for consultative assistance with management of her diabetes.  DIABETIC HISTORY:  [name] was diagnosed with DM in 1970's. Januvia was cost prohibitive. Jardiance caused rash . Her hemoglobin A1c has ranged from 7.2% in 2019, peaking at 8.6% in 2020  On her initial visit to our clinic she had an A1c of 8.0% She was on metformin only. Glipizide was added  SUBJECTIVE:   During the last visit (09/11/2020): A1c 6.8%, we continued metformin and  Glipizide      Today (09/12/2020): [name]  She checks her blood sugars 1 times daily, fasting . The patient has not had hypoglycemic episodes since the last clinic visit.   Denies nausea or vomiting or diarrhea  She is having leg pains for the past , takes topical  voltaren which helps    HOME DIABETES REGIMEN:   Metformin 1000 mg, 1 tablet with Breakfast and 1 tablet with supper Glipizide 5 mg,  1 tablet before Breakfast and Half a tablet before Supper     METER DOWNLOAD SUMMARY: Date range evaluated: 11/1-11/14/2022  Average Number Tests/Day = 1.1 Overall Mean FS Glucose = 168   BG Ranges: Low = 146 High = 198   Hypoglycemic Events/30 Days: BG < 50 = 0 Episodes of symptomatic severe hypoglycemia = 0    DIABETIC COMPLICATIONS: Microvascular complications:    Denies: neuropathy, retinopathy and CKD Last eye exam: Completed 09/11/2021   Macrovascular complications:    Denies: CAD, PVD, CVA   HISTORY:  Past Medical History:  Past Medical History:  Diagnosis Date   Abdominal pain    HOSP AT [facility]  02/13/12  TO  02/21/12 -DISCHARGE DX RUQ ABSCESS-MOST LIKELY GANGRENOUS CHLOLECYSTITIS WITH FISTULA TO THE COLON WITH ABSCESS;  DRAINAGE OF THE ABSCESS AND PLACEMENT OF DRAIN BY IR--PT STILL HAS THE DRAIN  TO DRAINAGE BAG AND IS ON ANTIBIOTIC   Arthritis    Diabetes mellitus    ORAL MED-NO INSULIN   GERD (gastroesophageal reflux disease)    OCCAS   Hypercholesteremia    Hypertension    Past Surgical History:  Past Surgical History:  Procedure Laterality Date   ABDOMINAL HYSTERECTOMY     APPENDECTOMY     CHOLECYSTECTOMY  04/10/2012   Procedure: CHOLECYSTECTOMY;  Surgeon: Earnstine Regal, MD;  [facility];  Service: General;;   COLOSTOMY REVISION  04/10/2012   Procedure: COLON RESECTION RIGHT;  Surgeon: Earnstine Regal, MD;  [facility];  Service: General;;   ESOPHAGOGASTRODUODENOSCOPY  02/14/2012   Procedure: ESOPHAGOGASTRODUODENOSCOPY (EGD);  Surgeon: Wonda Horner, MD;  Location: [facility];  Service: Endoscopy;  Laterality: N/A;   LAPAROTOMY  04/10/2012   Procedure: EXPLORATORY LAPAROTOMY;  Surgeon: Earnstine Regal, MD;  [facility];  Service: General;;   Social History:  reports that she has quit smoking. She has never used smokeless tobacco. She reports that she does not drink alcohol and does not use drugs. Family History:  Family History  Problem Relation Age of Onset   Cancer Sister        breast   Breast cancer Sister  Breast cancer Sister    Breast cancer Other    Breast cancer Other    Breast cancer Other      HOME MEDICATIONS: Allergies as of 09/17/2021       Reactions   Pioglitazone Other (See Comments)        Medication List        Accurate as of September 17, 2021  3:07 PM. If you have any questions, ask your nurse or doctor.          aspirin 325 MG tablet Take 325 mg by mouth daily with breakfast.   CALCIUM 600-D PO Take by mouth.   ezetimibe 10 MG tablet Commonly known as: ZETIA Take 10 mg by mouth daily. AT NIGHT   glipiZIDE 5 MG  tablet Commonly known as: GLUCOTROL TAKE ONE TABLET BY MOUTH BEFORE BREAKFAST and TAKE 1/2 TABLET BY MOUTH EVERY EVENING   latanoprost 0.005 % ophthalmic solution Commonly known as: XALATAN INSTILL 1 DROP INTO BOTH EYES EVERY DAY AT NIGHT   metFORMIN 1000 MG tablet Commonly known as: GLUCOPHAGE Take 1,000 mg by mouth 2 (two) times daily with a meal.   multivitamin with minerals Tabs tablet Take 1 tablet by mouth daily.   ONE TOUCH ULTRA 2 w/Device Kit by Does not apply route. Once daily   pravastatin 40 MG tablet Commonly known as: PRAVACHOL Take 40 mg by mouth daily.   telmisartan-hydrochlorothiazide 80-12.5 MG tablet Commonly known as: MICARDIS HCT Take 1 tablet by mouth daily with breakfast.   triamcinolone ointment 0.1 % Commonly known as: KENALOG APPLY ON THE SKIN NIGHTLY APPLY TO RED ITCHY AREAS, TAPER AS ABLE         OBJECTIVE:   Vital Signs: BP 120/70 (BP Location: Left Arm, Patient Position: Sitting, Cuff Size: Small)   Pulse 74   Ht '4\' 10"'  (1.473 m)   Wt 152 lb (68.9 kg)   SpO2 98%   BMI 31.77 kg/m   Wt Readings from Last 3 Encounters:  09/17/21 152 lb (68.9 kg)  03/12/21 153 lb 2 oz (69.5 kg)  09/11/20 154 lb 4 oz (70 kg)     Exam: General: Pt appears well and is in NAD  Lungs: Clear with good BS bilat with no rales, rhonchi, or wheezes  Heart: RRR   Extremities: No  pretibial edema.   Neuro: MS is good with appropriate affect, pt is alert and Ox3   DM foot exam: 03/12/2021   The skin of the feet is intact without sores or ulcerations. The pedal pulses are 2+ on right and 2+ on left. The sensation is intact to a screening 5.07, 10 gram monofilament bilaterally    DATA REVIEWED:  Lab Results  Component Value Date   HGBA1C 7.2 (A) 09/17/2021   HGBA1C 7.0 (A) 03/12/2021   HGBA1C 6.8 (A) 09/11/2020   10/06/2020 BUN/Cr 14/0.690 LDL 134 HDL 76 Tg 95    ASSESSMENT / PLAN / RECOMMENDATIONS:   1) Type 2 Diabetes Mellitus,OPtimally  controlled, Without complications - Most recent A1c of 7.2 %. Goal A1c < 8.0 %.    -A1c acceptable  - No changes will be made today   MEDICATIONS: Continue Glipizide 5 mg, 1 tablet before Breakfast and Half a tablet before Supper Continue Metformin 1000 mg BID   EDUCATION / INSTRUCTIONS: BG monitoring instructions: Patient is instructed to check her blood sugars 1 times a day, fasting . Call [facility] if: BG persistently < 70 . I reviewed the Rule of  15 for the treatment of hypoglycemia in detail with the patient. Literature supplied.   2) Diabetic complications:  Eye: Does not have known diabetic retinopathy.  Neuro/ Feet: Does not have known diabetic peripheral neuropathy. Renal: Patient does note have known baseline CKD. She is on an ACEI/ARB at present.   F/U in 6 months    Signed electronically by: Mack Guise, MD  [facility] Endocrinology  [facility] [address] [place], [place] [address] Phone: [phone] FAX: [phone]   CC: Antony Contras, MD [address] [place] [address] Phone: [phone]  Fax: [phone]  Return to Endocrinology clinic as below: Future Appointments  Date Time Provider [place]  10/10/2021  9:10 AM [facility] MM 2 [facility]

## 2021-10-03 DIAGNOSIS — I10 Essential (primary) hypertension: Secondary | ICD-10-CM | POA: Diagnosis not present

## 2021-10-10 ENCOUNTER — Ambulatory Visit
Admission: RE | Admit: 2021-10-10 | Discharge: 2021-10-10 | Disposition: A | Discharge status: Home / Self Care | Payer: Medicare Other | Source: Ambulatory Visit | Attending: Family Medicine | Admitting: Family Medicine

## 2021-10-10 DIAGNOSIS — Z1231 Encounter for screening mammogram for malignant neoplasm of breast: Secondary | ICD-10-CM

## 2021-10-11 ENCOUNTER — Other Ambulatory Visit: Payer: Self-pay | Admitting: Family Medicine

## 2021-10-11 DIAGNOSIS — R928 Other abnormal and inconclusive findings on diagnostic imaging of breast: Secondary | ICD-10-CM

## 2021-10-12 DIAGNOSIS — E1165 Type 2 diabetes mellitus with hyperglycemia: Secondary | ICD-10-CM | POA: Diagnosis not present

## 2021-10-12 DIAGNOSIS — E119 Type 2 diabetes mellitus without complications: Secondary | ICD-10-CM | POA: Diagnosis not present

## 2021-10-12 DIAGNOSIS — E782 Mixed hyperlipidemia: Secondary | ICD-10-CM | POA: Diagnosis not present

## 2021-10-12 DIAGNOSIS — K219 Gastro-esophageal reflux disease without esophagitis: Secondary | ICD-10-CM | POA: Diagnosis not present

## 2021-10-12 DIAGNOSIS — I10 Essential (primary) hypertension: Secondary | ICD-10-CM | POA: Diagnosis not present

## 2021-10-12 DIAGNOSIS — H409 Unspecified glaucoma: Secondary | ICD-10-CM | POA: Diagnosis not present

## 2021-10-12 DIAGNOSIS — E1169 Type 2 diabetes mellitus with other specified complication: Secondary | ICD-10-CM | POA: Diagnosis not present

## 2021-10-12 DIAGNOSIS — D649 Anemia, unspecified: Secondary | ICD-10-CM | POA: Diagnosis not present

## 2021-10-16 DIAGNOSIS — E1169 Type 2 diabetes mellitus with other specified complication: Secondary | ICD-10-CM | POA: Diagnosis not present

## 2021-10-16 DIAGNOSIS — E782 Mixed hyperlipidemia: Secondary | ICD-10-CM | POA: Diagnosis not present

## 2021-10-16 DIAGNOSIS — D649 Anemia, unspecified: Secondary | ICD-10-CM | POA: Diagnosis not present

## 2021-10-16 DIAGNOSIS — M85851 Other specified disorders of bone density and structure, right thigh: Secondary | ICD-10-CM | POA: Diagnosis not present

## 2021-10-16 DIAGNOSIS — I10 Essential (primary) hypertension: Secondary | ICD-10-CM | POA: Diagnosis not present

## 2021-10-16 DIAGNOSIS — Z7984 Long term (current) use of oral hypoglycemic drugs: Secondary | ICD-10-CM | POA: Diagnosis not present

## 2021-10-16 DIAGNOSIS — M353 Polymyalgia rheumatica: Secondary | ICD-10-CM | POA: Diagnosis not present

## 2021-10-16 DIAGNOSIS — K219 Gastro-esophageal reflux disease without esophagitis: Secondary | ICD-10-CM | POA: Diagnosis not present

## 2021-11-02 DIAGNOSIS — I10 Essential (primary) hypertension: Secondary | ICD-10-CM | POA: Diagnosis not present

## 2021-11-21 ENCOUNTER — Ambulatory Visit
Admission: RE | Admit: 2021-11-21 | Discharge: 2021-11-21 | Disposition: A | Discharge status: Home / Self Care | Payer: Medicare Other | Source: Ambulatory Visit | Attending: Family Medicine | Admitting: Family Medicine

## 2021-11-21 ENCOUNTER — Other Ambulatory Visit: Payer: Self-pay

## 2021-11-21 DIAGNOSIS — R928 Other abnormal and inconclusive findings on diagnostic imaging of breast: Secondary | ICD-10-CM

## 2021-11-21 DIAGNOSIS — R922 Inconclusive mammogram: Secondary | ICD-10-CM | POA: Diagnosis not present

## 2021-12-04 DIAGNOSIS — I10 Essential (primary) hypertension: Secondary | ICD-10-CM | POA: Diagnosis not present

## 2021-12-12 DIAGNOSIS — H401131 Primary open-angle glaucoma, bilateral, mild stage: Secondary | ICD-10-CM | POA: Diagnosis not present

## 2021-12-19 DIAGNOSIS — E119 Type 2 diabetes mellitus without complications: Secondary | ICD-10-CM | POA: Diagnosis not present

## 2021-12-28 ENCOUNTER — Other Ambulatory Visit: Payer: Self-pay | Admitting: Internal Medicine

## 2022-01-01 DIAGNOSIS — E782 Mixed hyperlipidemia: Secondary | ICD-10-CM | POA: Diagnosis not present

## 2022-01-01 DIAGNOSIS — I10 Essential (primary) hypertension: Secondary | ICD-10-CM | POA: Diagnosis not present

## 2022-01-01 DIAGNOSIS — E119 Type 2 diabetes mellitus without complications: Secondary | ICD-10-CM | POA: Diagnosis not present

## 2022-01-07 DIAGNOSIS — I10 Essential (primary) hypertension: Secondary | ICD-10-CM | POA: Diagnosis not present

## 2022-01-07 DIAGNOSIS — E1165 Type 2 diabetes mellitus with hyperglycemia: Secondary | ICD-10-CM | POA: Diagnosis not present

## 2022-01-07 DIAGNOSIS — E782 Mixed hyperlipidemia: Secondary | ICD-10-CM | POA: Diagnosis not present

## 2022-02-01 DIAGNOSIS — I10 Essential (primary) hypertension: Secondary | ICD-10-CM | POA: Diagnosis not present

## 2022-03-03 DIAGNOSIS — I10 Essential (primary) hypertension: Secondary | ICD-10-CM | POA: Diagnosis not present

## 2022-03-12 DIAGNOSIS — H25813 Combined forms of age-related cataract, bilateral: Secondary | ICD-10-CM | POA: Diagnosis not present

## 2022-03-12 DIAGNOSIS — H401131 Primary open-angle glaucoma, bilateral, mild stage: Secondary | ICD-10-CM | POA: Diagnosis not present

## 2022-03-12 DIAGNOSIS — E1136 Type 2 diabetes mellitus with diabetic cataract: Secondary | ICD-10-CM | POA: Diagnosis not present

## 2022-03-15 DIAGNOSIS — E119 Type 2 diabetes mellitus without complications: Secondary | ICD-10-CM | POA: Diagnosis not present

## 2022-03-20 ENCOUNTER — Encounter: Payer: Self-pay | Admitting: Internal Medicine

## 2022-03-20 ENCOUNTER — Ambulatory Visit (INDEPENDENT_AMBULATORY_CARE_PROVIDER_SITE_OTHER): Payer: Medicare Other | Admitting: Internal Medicine

## 2022-03-20 VITALS — BP 120/80 | HR 98 | Ht <= 58 in | Wt 154.2 lb

## 2022-03-20 DIAGNOSIS — L509 Urticaria, unspecified: Secondary | ICD-10-CM

## 2022-03-20 DIAGNOSIS — E1165 Type 2 diabetes mellitus with hyperglycemia: Secondary | ICD-10-CM

## 2022-03-20 DIAGNOSIS — E119 Type 2 diabetes mellitus without complications: Secondary | ICD-10-CM

## 2022-03-20 DIAGNOSIS — R739 Hyperglycemia, unspecified: Secondary | ICD-10-CM

## 2022-03-20 LAB — POCT GLYCOSYLATED HEMOGLOBIN (HGB A1C): Hemoglobin A1C: 7.3 % — AB (ref 4.0–5.6)

## 2022-03-20 MED ORDER — GLIPIZIDE 5 MG PO TABS: ORAL_TABLET | ORAL | 3 refills | Status: DC

## 2022-03-20 MED ORDER — ONETOUCH ULTRA VI STRP: 1.0000 | ORAL_STRIP | Freq: Every day | 3 refills | Status: AC

## 2022-03-20 MED ORDER — METFORMIN HCL 1000 MG PO TABS: 1000.0000 mg | ORAL_TABLET | Freq: Two times a day (BID) | ORAL | 3 refills | Status: DC

## 2022-03-20 MED ORDER — HYDROXYZINE PAMOATE 25 MG PO CAPS: 25.0000 mg | ORAL_CAPSULE | Freq: Three times a day (TID) | ORAL | 0 refills | Status: DC | PRN

## 2022-03-20 NOTE — Progress Notes (Signed)
?Name: Kelly Farmer  ?Age/ Sex: 81 y.o., female   ?MRN/ DOB: [id number], October 27, 1941    ? ?PCP: Antony Contras, MD   ?Reason for Endocrinology Evaluation: Type 2 Diabetes Mellitus  ?Initial Endocrine Consultative Visit: 01/10/2020  ? ? ?PATIENT IDENTIFIER: Kelly Farmer is a 81 y.o. female with a past medical history of HTN,T2DM and Dyslipidemia. The patient has followed with Endocrinology clinic since 01/10/2020 for consultative assistance with management of her diabetes. ? ?DIABETIC HISTORY:  ?Kelly Farmer was diagnosed with DM in 1970's. Januvia was cost prohibitive. Jardiance caused rash . Her hemoglobin A1c has ranged from 7.2% in 2019, peaking at 8.6% in 2020 ? ?On her initial visit to our clinic she had an A1c of 8.0% She was on metformin only. Glipizide was added ? ?SUBJECTIVE:  ? ?During the last visit (09/17/2021): A1c 7.2%, we continued metformin and  Glipizide  ? ? ? ? ?Today (09/12/2020): Ms. Maahs  She checks her blood sugars 1 times daily, fasting . The patient has not had hypoglycemic episodes since the last clinic visit. ? ? ?Denies nausea or vomiting or diarrhea  ?She developed a pruritic rash over the past week , she denies using new detergent , she saw [facility] dermatology in 2021  ? ? ?HOME DIABETES REGIMEN:  ? Metformin 1000 mg, 1 tablet with Breakfast and 1 tablet with supper ?Glipizide 5 mg,  1 tablet before Breakfast and Half a tablet before Supper ? ? ? ? ?METER DOWNLOAD SUMMARY: Date range evaluated: 5/4-5/17/2023 ? ?Average Number Tests/Day = 1 ?Overall Mean FS Glucose = 171 ? ? ?BG Ranges: ?Low = 139 ?High = 225 ? ? ?Hypoglycemic Events/30 Days: ?BG < 50 = 0 ?Episodes of symptomatic severe hypoglycemia = 0 ? ? ? ?DIABETIC COMPLICATIONS: ?Microvascular complications:  ?  ?Denies: neuropathy, retinopathy and CKD ?Last eye exam: Completed 09/11/2021 ?  ?Macrovascular complications:  ?  ?Denies: CAD, PVD, CVA ? ? ?HISTORY:  ?Past Medical History:  ?Past Medical History:   ?Diagnosis Date  ? Abdominal pain   ? HOSP AT [facility] 02/13/12  TO  02/21/12 -DISCHARGE DX RUQ ABSCESS-MOST LIKELY GANGRENOUS CHLOLECYSTITIS WITH FISTULA TO THE COLON WITH ABSCESS;  DRAINAGE OF THE ABSCESS AND PLACEMENT OF DRAIN BY IR--PT STILL HAS THE DRAIN  TO DRAINAGE BAG AND IS ON ANTIBIOTIC  ? Arthritis   ? Diabetes mellitus   ? ORAL MED-NO INSULIN  ? GERD (gastroesophageal reflux disease)   ? OCCAS  ? Hypercholesteremia   ? Hypertension   ? ?Past Surgical History:  ?Past Surgical History:  ?Procedure Laterality Date  ? ABDOMINAL HYSTERECTOMY    ? APPENDECTOMY    ? CHOLECYSTECTOMY  04/10/2012  ? Procedure: CHOLECYSTECTOMY;  Surgeon: Earnstine Regal, MD;  Location: [facility];  Service: General;;  ? COLOSTOMY REVISION  04/10/2012  ? Procedure: COLON RESECTION RIGHT;  Surgeon: Earnstine Regal, MD;  Location: [facility];  Service: General;;  ? ESOPHAGOGASTRODUODENOSCOPY  02/14/2012  ? Procedure: ESOPHAGOGASTRODUODENOSCOPY (EGD);  Surgeon: Wonda Horner, MD;  Location: [facility];  Service: Endoscopy;  Laterality: N/A;  ? LAPAROTOMY  04/10/2012  ? Procedure: EXPLORATORY LAPAROTOMY;  Surgeon: Earnstine Regal, MD;  Location: [facility];  Service: General;;  ? ?Social History:  reports that she has quit smoking. She has never used smokeless tobacco. She reports that she does not drink alcohol and does not use drugs. ?Family History:  ?Family History  ?Problem Relation Age of Onset  ? Cancer Sister   ?  breast  ? Breast cancer Sister   ? Breast cancer Sister   ? Breast cancer Other   ? Breast cancer Other   ? Breast cancer Other   ? ? ? ?HOME MEDICATIONS: ?Allergies as of 03/20/2022   ? ?   Reactions  ? Pioglitazone Other (See Comments)  ? ?  ? ?  ?Medication List  ?  ? ?  ? Accurate as of Mar 20, 2022 10:25 AM. If you have any questions, ask your nurse or doctor.  ?  ?  ? ?  ? ?aspirin 325 MG tablet ?Take 325 mg by mouth daily with breakfast. ?  ?CALCIUM 600-D PO ?Take by mouth. ?  ?ezetimibe 10 MG tablet ?Commonly known as: ZETIA ?Take 10 mg  by mouth daily. AT NIGHT ?  ?glipiZIDE 5 MG tablet ?Commonly known as: GLUCOTROL ?TAKE ONE TABLET BY MOUTH BEFORE BREAKFAST and TAKE 1/2 TABLET BY MOUTH EVERY EVENING ?  ?latanoprost 0.005 % ophthalmic solution ?Commonly known as: XALATAN ?INSTILL 1 DROP INTO BOTH EYES EVERY DAY AT NIGHT ?  ?losartan-hydrochlorothiazide 100-12.5 MG tablet ?Commonly known as: HYZAAR ?Take 1 tablet by mouth daily. ?  ?metFORMIN 1000 MG tablet ?Commonly known as: GLUCOPHAGE ?Take 1 tablet (1,000 mg total) by mouth 2 (two) times daily with a meal. ?  ?multivitamin with minerals Tabs tablet ?Take 1 tablet by mouth daily. ?  ?ONE TOUCH ULTRA 2 w/Device Kit ?by Does not apply route. Once daily ?  ?pravastatin 40 MG tablet ?Commonly known as: PRAVACHOL ?Take 40 mg by mouth daily. ?  ?telmisartan-hydrochlorothiazide 80-12.5 MG tablet ?Commonly known as: MICARDIS HCT ?Take 1 tablet by mouth daily with breakfast. ?  ?triamcinolone ointment 0.1 % ?Commonly known as: KENALOG ?APPLY ON THE SKIN NIGHTLY APPLY TO RED ITCHY AREAS, TAPER AS ABLE ?  ? ?  ? ? ? ?OBJECTIVE:  ? ?Vital Signs: BP 120/80 (BP Location: Left Arm, Patient Position: Sitting, Cuff Size: Small)   Pulse 98   Ht '4\' 10"'  (1.473 m)   Wt 154 lb 3.2 oz (69.9 kg)   SpO2 99%   BMI 32.23 kg/m?   ?Wt Readings from Last 3 Encounters:  ?03/20/22 154 lb 3.2 oz (69.9 kg)  ?09/17/21 152 lb (68.9 kg)  ?03/12/21 153 lb 2 oz (69.5 kg)  ? ? ? ?Exam: ?General: Pt appears well and is in NAD ?Pt with scattered hives over the arms and chest area, target lesion on the right foot   ?Lungs: Clear with good BS bilat with no rales, rhonchi, or wheezes  ?Heart: RRR   ?Extremities: No  pretibial edema.   ?Neuro: MS is good with appropriate affect, pt is alert and Ox3  ? ?DM foot exam: 03/20/2022 ?  ?The skin of the feet is intact without sores or ulcerations. ?The pedal pulses are 2+ on right and 2+ on left. ?The sensation is intact to a screening 5.07, 10 gram monofilament bilaterally ? ? ? ?DATA  REVIEWED: ? ?Lab Results  ?Component Value Date  ? HGBA1C 7.2 (A) 09/17/2021  ? HGBA1C 7.0 (A) 03/12/2021  ? HGBA1C 6.8 (A) 09/11/2020  ? ?10/06/2020 ?BUN/Cr 14/0.690 ?LDL 134 ?HDL 76 ?Tg 95  ? ? ?ASSESSMENT / PLAN / RECOMMENDATIONS:  ? ?1) Type 2 Diabetes Mellitus,OPtimally controlled, Without complications - Most recent A1c of 7.3 %. Goal A1c < 8.0 %.   ? ?- A1c acceptable  ?- No changes will be made today  ? ?MEDICATIONS: ?Continue Glipizide 5 mg, 1 tablet before Breakfast and  Half a tablet before Supper ?Continue Metformin 1000 mg BID  ? ?EDUCATION / INSTRUCTIONS: ?BG monitoring instructions: Patient is instructed to check her blood sugars 1 times a day, fasting . ?Call [facility] if: BG persistently < 70 . ?I reviewed the Rule of 15 for the treatment of hypoglycemia in detail with the patient. Literature supplied. ? ? ?2) Diabetic complications:  ?Eye: Does not have known diabetic retinopathy.  ?Neuro/ Feet: Does not have known diabetic peripheral neuropathy. ?Renal: Patient does note have known baseline CKD. She is on an ACEI/ARB at present. ? ? ?3)Urticaria  ? ?- Will prescribe hydroxyzine , cautioned against drowsiness  ?- She was advised to re-contact [place] [facility]  ?- She  is on OTC camphor cream  ? ?F/U in 6 months  ? ? ?Signed electronically by: ?Abby Nena Jordan, MD ? ?[facility]  ?[facility] ?[address] ?[place],  [address] ?Phone: [phone] ?FAX: [phone] ? ? ?CC: ?Antony Contras, MD ?[address] ?[place] [address] ?Phone: [phone]  ?Fax: [phone] ? ?Return to Endocrinology clinic as below: ?Future Appointments  ?Date Time Provider Nettie  ?03/20/2022 10:30 AM Jemina Scahill, Melanie Crazier, MD [facility] None  ?  ? ? ?

## 2022-03-20 NOTE — Patient Instructions (Signed)
-   Metformin 1000 mg, 1 tablet with Breakfast and 1 tablet with supper - Glipizide 5 mg, 1 tablet before Breakfast , and HALF a  tablet before Supper      HOW TO TREAT LOW BLOOD SUGARS (Blood sugar LESS THAN 70 MG/DL) Please follow the RULE OF 15 for the treatment of hypoglycemia treatment (when your (blood sugars are less than 70 mg/dL)   STEP 1: Take 15 grams of carbohydrates when your blood sugar is low, which includes:  3-4 GLUCOSE TABS  OR 3-4 OZ OF JUICE OR REGULAR SODA OR ONE TUBE OF GLUCOSE GEL    STEP 2: RECHECK blood sugar in 15 MINUTES STEP 3: If your blood sugar is still low at the 15 minute recheck --> then, go back to STEP 1 and treat AGAIN with another 15 grams of carbohydrates. 

## 2022-03-21 ENCOUNTER — Encounter: Payer: Self-pay | Admitting: Internal Medicine

## 2022-03-21 ENCOUNTER — Ambulatory Visit: Payer: Medicare Other | Admitting: Internal Medicine

## 2022-04-02 DIAGNOSIS — I10 Essential (primary) hypertension: Secondary | ICD-10-CM | POA: Diagnosis not present

## 2022-04-02 DIAGNOSIS — E782 Mixed hyperlipidemia: Secondary | ICD-10-CM | POA: Diagnosis not present

## 2022-04-02 DIAGNOSIS — K219 Gastro-esophageal reflux disease without esophagitis: Secondary | ICD-10-CM | POA: Diagnosis not present

## 2022-04-02 DIAGNOSIS — E119 Type 2 diabetes mellitus without complications: Secondary | ICD-10-CM | POA: Diagnosis not present

## 2022-04-10 ENCOUNTER — Other Ambulatory Visit: Payer: Self-pay | Admitting: Internal Medicine

## 2022-04-22 DIAGNOSIS — L2089 Other atopic dermatitis: Secondary | ICD-10-CM | POA: Diagnosis not present

## 2022-06-04 DIAGNOSIS — E782 Mixed hyperlipidemia: Secondary | ICD-10-CM | POA: Diagnosis not present

## 2022-06-04 DIAGNOSIS — M353 Polymyalgia rheumatica: Secondary | ICD-10-CM | POA: Diagnosis not present

## 2022-06-04 DIAGNOSIS — K219 Gastro-esophageal reflux disease without esophagitis: Secondary | ICD-10-CM | POA: Diagnosis not present

## 2022-06-04 DIAGNOSIS — I10 Essential (primary) hypertension: Secondary | ICD-10-CM | POA: Diagnosis not present

## 2022-06-04 DIAGNOSIS — D649 Anemia, unspecified: Secondary | ICD-10-CM | POA: Diagnosis not present

## 2022-06-04 DIAGNOSIS — Z1211 Encounter for screening for malignant neoplasm of colon: Secondary | ICD-10-CM | POA: Diagnosis not present

## 2022-06-04 DIAGNOSIS — L309 Dermatitis, unspecified: Secondary | ICD-10-CM | POA: Diagnosis not present

## 2022-06-04 DIAGNOSIS — M85851 Other specified disorders of bone density and structure, right thigh: Secondary | ICD-10-CM | POA: Diagnosis not present

## 2022-06-04 DIAGNOSIS — Z0000 Encounter for general adult medical examination without abnormal findings: Secondary | ICD-10-CM | POA: Diagnosis not present

## 2022-06-04 DIAGNOSIS — Z7984 Long term (current) use of oral hypoglycemic drugs: Secondary | ICD-10-CM | POA: Diagnosis not present

## 2022-06-04 DIAGNOSIS — E1169 Type 2 diabetes mellitus with other specified complication: Secondary | ICD-10-CM | POA: Diagnosis not present

## 2022-06-07 DIAGNOSIS — E119 Type 2 diabetes mellitus without complications: Secondary | ICD-10-CM | POA: Diagnosis not present

## 2022-06-12 ENCOUNTER — Other Ambulatory Visit: Payer: Self-pay | Admitting: Internal Medicine

## 2022-06-19 DIAGNOSIS — H401131 Primary open-angle glaucoma, bilateral, mild stage: Secondary | ICD-10-CM | POA: Diagnosis not present

## 2022-06-29 ENCOUNTER — Other Ambulatory Visit: Payer: Self-pay | Admitting: Internal Medicine

## 2022-07-01 ENCOUNTER — Other Ambulatory Visit: Payer: Self-pay | Admitting: Internal Medicine

## 2022-07-10 ENCOUNTER — Other Ambulatory Visit: Payer: Self-pay | Admitting: Internal Medicine

## 2022-08-30 ENCOUNTER — Ambulatory Visit
Admission: RE | Admit: 2022-08-30 | Discharge: 2022-08-30 | Disposition: A | Discharge status: Home / Self Care | Payer: Medicare Other | Source: Ambulatory Visit | Attending: Family Medicine | Admitting: Family Medicine

## 2022-08-30 ENCOUNTER — Other Ambulatory Visit: Payer: Self-pay | Admitting: Family Medicine

## 2022-08-30 DIAGNOSIS — R296 Repeated falls: Secondary | ICD-10-CM | POA: Diagnosis not present

## 2022-08-30 DIAGNOSIS — M79652 Pain in left thigh: Secondary | ICD-10-CM

## 2022-08-30 DIAGNOSIS — Z23 Encounter for immunization: Secondary | ICD-10-CM | POA: Diagnosis not present

## 2022-09-03 DIAGNOSIS — E119 Type 2 diabetes mellitus without complications: Secondary | ICD-10-CM | POA: Diagnosis not present

## 2022-09-09 ENCOUNTER — Other Ambulatory Visit: Payer: Self-pay | Admitting: Family Medicine

## 2022-09-09 DIAGNOSIS — Z1231 Encounter for screening mammogram for malignant neoplasm of breast: Secondary | ICD-10-CM

## 2022-09-17 DIAGNOSIS — H401131 Primary open-angle glaucoma, bilateral, mild stage: Secondary | ICD-10-CM | POA: Diagnosis not present

## 2022-09-24 DIAGNOSIS — E1169 Type 2 diabetes mellitus with other specified complication: Secondary | ICD-10-CM | POA: Diagnosis not present

## 2022-09-25 ENCOUNTER — Encounter: Payer: Self-pay | Admitting: Internal Medicine

## 2022-09-25 ENCOUNTER — Ambulatory Visit (INDEPENDENT_AMBULATORY_CARE_PROVIDER_SITE_OTHER): Payer: Medicare Other | Admitting: Internal Medicine

## 2022-09-25 VITALS — BP 126/82 | HR 94 | Ht <= 58 in | Wt 150.6 lb

## 2022-09-25 DIAGNOSIS — E1165 Type 2 diabetes mellitus with hyperglycemia: Secondary | ICD-10-CM

## 2022-09-25 LAB — POCT GLYCOSYLATED HEMOGLOBIN (HGB A1C): Hemoglobin A1C: 7 % — AB (ref 4.0–5.6)

## 2022-09-25 MED ORDER — METFORMIN HCL 1000 MG PO TABS
1000.0000 mg | ORAL_TABLET | Freq: Two times a day (BID) | ORAL | 3 refills | Status: DC
Start: 2022-09-25 — End: 2024-03-30

## 2022-09-25 MED ORDER — GLIPIZIDE 5 MG PO TABS: ORAL_TABLET | ORAL | 3 refills | Status: DC

## 2022-09-25 NOTE — Patient Instructions (Signed)
-   Metformin 1000 mg, 1 tablet with Breakfast and 1 tablet with supper - Glipizide 5 mg, 1 tablet before Breakfast , and HALF a  tablet before Supper      HOW TO TREAT LOW BLOOD SUGARS (Blood sugar LESS THAN 70 MG/DL) Please follow the RULE OF 15 for the treatment of hypoglycemia treatment (when your (blood sugars are less than 70 mg/dL)   STEP 1: Take 15 grams of carbohydrates when your blood sugar is low, which includes:  3-4 GLUCOSE TABS  OR 3-4 OZ OF JUICE OR REGULAR SODA OR ONE TUBE OF GLUCOSE GEL    STEP 2: RECHECK blood sugar in 15 MINUTES STEP 3: If your blood sugar is still low at the 15 minute recheck --> then, go back to STEP 1 and treat AGAIN with another 15 grams of carbohydrates.

## 2022-09-25 NOTE — Progress Notes (Signed)
Name: Kelly Farmer  Age/ Sex: 81 y.o., female   MRN/ DOB: [id number], 1941-06-12     PCP: Tally Joe, MD   Reason for Endocrinology Evaluation: Type 2 Diabetes Mellitus  Initial Endocrine Consultative Visit: 01/10/2020    PATIENT IDENTIFIER: Kelly Farmer is a 81 y.o. female with a past medical history of HTN,T2DM and Dyslipidemia. The patient has followed with Endocrinology clinic since 01/10/2020 for consultative assistance with management of her diabetes.  DIABETIC HISTORY:  Kelly Farmer was diagnosed with DM in 1970's. Januvia was cost prohibitive. Jardiance caused rash . Her hemoglobin A1c has ranged from 7.2% in 2019, peaking at 8.6% in 2020  On her initial visit to our clinic she had an A1c of 8.0% She was on metformin only. Glipizide was added  SUBJECTIVE:   During the last visit (03/20/2022): A1c 7.3%   Today (09/12/2020): Kelly Farmer  She checks her blood sugars 1 times daily, fasting . The patient has not had hypoglycemic episodes since the last clinic visit.   Denies nausea, vomiting or diarrhea  Denies Blurry   HOME DIABETES REGIMEN:   Metformin 1000 mg, 1 tablet with Breakfast and 1 tablet with supper Glipizide 5 mg, 1 tablet before Breakfast and Half a tablet before Supper     METER DOWNLOAD SUMMARY: Date range evaluated: 11/9-11/22/2023  Average Number Tests/Day = 1 Overall Mean FS Glucose = 144   BG Ranges: Low = 97 High = 177   Hypoglycemic Events/30 Days: BG < 50 = 0 Episodes of symptomatic severe hypoglycemia = 0    DIABETIC COMPLICATIONS: Microvascular complications:    Denies: neuropathy, retinopathy and CKD Last eye exam: Completed 09/17/2022   Macrovascular complications:    Denies: CAD, PVD, CVA   HISTORY:  Past Medical History:  Past Medical History:  Diagnosis Date   Abdominal pain    HOSP AT [facility] 02/13/12  TO  02/21/12 -DISCHARGE DX RUQ ABSCESS-MOST LIKELY GANGRENOUS CHLOLECYSTITIS WITH FISTULA TO THE COLON  WITH ABSCESS;  DRAINAGE OF THE ABSCESS AND PLACEMENT OF DRAIN BY IR--PT STILL HAS THE DRAIN  TO DRAINAGE BAG AND IS ON ANTIBIOTIC   Arthritis    Diabetes mellitus    ORAL MED-NO INSULIN   GERD (gastroesophageal reflux disease)    OCCAS   Hypercholesteremia    Hypertension    Past Surgical History:  Past Surgical History:  Procedure Laterality Date   ABDOMINAL HYSTERECTOMY     APPENDECTOMY     CHOLECYSTECTOMY  04/10/2012   Procedure: CHOLECYSTECTOMY;  Surgeon: Velora Heckler, MD;  Location: [facility];  Service: General;;   COLOSTOMY REVISION  04/10/2012   Procedure: COLON RESECTION RIGHT;  Surgeon: Velora Heckler, MD;  Location: [facility];  Service: General;;   ESOPHAGOGASTRODUODENOSCOPY  02/14/2012   Procedure: ESOPHAGOGASTRODUODENOSCOPY (EGD);  Surgeon: Graylin Shiver, MD;  Location: [facility];  Service: Endoscopy;  Laterality: N/A;   LAPAROTOMY  04/10/2012   Procedure: EXPLORATORY LAPAROTOMY;  Surgeon: Velora Heckler, MD;  Location: [facility];  Service: General;;   Social History:  reports that she has quit smoking. She has never used smokeless tobacco. She reports that she does not drink alcohol and does not use drugs. Family History:  Family History  Problem Relation Age of Onset   Cancer Sister        breast   Breast cancer Sister    Breast cancer Sister    Breast cancer Other    Breast cancer Other    Breast cancer Other  HOME MEDICATIONS: Allergies as of 09/25/2022       Reactions   Pioglitazone Other (See Comments)        Medication List        Accurate as of September 25, 2022 10:56 AM. If you have any questions, ask your nurse or doctor.          aspirin 325 MG tablet Take 325 mg by mouth daily with breakfast.   CALCIUM 600-D PO Take by mouth.   ezetimibe 10 MG tablet Commonly known as: ZETIA Take 10 mg by mouth daily. AT NIGHT   glipiZIDE 5 MG tablet Commonly known as: GLUCOTROL TAKE ONE TABLET BY MOUTH BEFORE BREAKFAST and TAKE 1/2 TABLET BY MOUTH  EVERY EVENING   hydrOXYzine 25 MG capsule Commonly known as: VISTARIL TAKE ONE CAPSULE BY MOUTH EVERY 8 HOURS AS NEEDED   latanoprost 0.005 % ophthalmic solution Commonly known as: XALATAN INSTILL 1 DROP INTO BOTH EYES EVERY DAY AT NIGHT   losartan-hydrochlorothiazide 100-12.5 MG tablet Commonly known as: HYZAAR Take 1 tablet by mouth daily.   metFORMIN 1000 MG tablet Commonly known as: GLUCOPHAGE Take 1 tablet (1,000 mg total) by mouth 2 (two) times daily with a meal.   multivitamin with minerals Tabs tablet Take 1 tablet by mouth daily.   OneTouch Ultra test strip Generic drug: glucose blood 1 each by Other route daily in the afternoon. Use as instructed   pravastatin 40 MG tablet Commonly known as: PRAVACHOL Take 40 mg by mouth daily.   telmisartan-hydrochlorothiazide 80-12.5 MG tablet Commonly known as: MICARDIS HCT Take 1 tablet by mouth daily with breakfast.   triamcinolone ointment 0.1 % Commonly known as: KENALOG APPLY ON THE SKIN NIGHTLY APPLY TO RED ITCHY AREAS, TAPER AS ABLE         OBJECTIVE:   Vital Signs: BP 126/82 (BP Location: Left Arm, Patient Position: Sitting, Cuff Size: Large)   Pulse 94   Ht 4\' 10"  (1.473 m)   Wt 150 lb 9.6 oz (68.3 kg)   SpO2 99%   BMI 31.48 kg/m   Wt Readings from Last 3 Encounters:  09/25/22 150 lb 9.6 oz (68.3 kg)  03/20/22 154 lb 3.2 oz (69.9 kg)  09/17/21 152 lb (68.9 kg)     Exam: General: Pt appears well and is in NAD Pt with scattered hives over the arms and chest area, target lesion on the right foot   Lungs: Clear with good BS bilat with no rales, rhonchi, or wheezes  Heart: RRR   Extremities: No  pretibial edema.   Neuro: MS is good with appropriate affect, pt is alert and Ox3   DM foot exam: 03/20/2022   The skin of the feet is intact without sores or ulcerations. The pedal pulses are 2+ on right and 2+ on left. The sensation is intact to a screening 5.07, 10 gram monofilament  bilaterally    DATA REVIEWED:  Lab Results  Component Value Date   HGBA1C 7.0 (A) 09/25/2022   HGBA1C 7.3 (A) 03/20/2022   HGBA1C 7.2 (A) 09/17/2021   10/06/2020 BUN/Cr 14/0.690 LDL 134 HDL 76 Tg 95    ASSESSMENT / PLAN / RECOMMENDATIONS:   1) Type 2 Diabetes Mellitus,OPtimally controlled, Without complications - Most recent A1c of 7.0 %. Goal A1c < 8.0 %.    - A1c optimal without hypoglycemia  - No changes will be made today   MEDICATIONS: Continue Glipizide 5 mg, 1 tablet before Breakfast and Half a tablet before Supper Continue  Metformin 1000 mg BID   EDUCATION / INSTRUCTIONS: BG monitoring instructions: Patient is instructed to check her blood sugars 1 times a day, fasting . Call [facility] if: BG persistently < 70 . I reviewed the Rule of 15 for the treatment of hypoglycemia in detail with the patient. Literature supplied.   2) Diabetic complications:  Eye: Does not have known diabetic retinopathy.  Neuro/ Feet: Does not have known diabetic peripheral neuropathy. Renal: Patient does note have known baseline CKD. She is on an ACEI/ARB at present.    F/U in 6 months    Signed electronically by: Lyndle Herrlich, MD  [facility]  [facility] [address] [place], [place] [address] Phone: [phone] FAX: [phone]   CC: Tally Joe, MD [address] [place] [address] Phone: [phone]  Fax: [phone]  Return to Endocrinology clinic as below: Future Appointments  Date Time Provider Department Center  11/27/2022  8:00 AM [facility]

## 2022-09-30 DIAGNOSIS — E782 Mixed hyperlipidemia: Secondary | ICD-10-CM | POA: Diagnosis not present

## 2022-09-30 DIAGNOSIS — E119 Type 2 diabetes mellitus without complications: Secondary | ICD-10-CM | POA: Diagnosis not present

## 2022-09-30 DIAGNOSIS — I10 Essential (primary) hypertension: Secondary | ICD-10-CM | POA: Diagnosis not present

## 2022-11-22 ENCOUNTER — Ambulatory Visit: Payer: Medicare Other

## 2022-11-27 ENCOUNTER — Ambulatory Visit
Admission: RE | Admit: 2022-11-27 | Discharge: 2022-11-27 | Disposition: A | Discharge status: Home / Self Care | Payer: 59 | Source: Ambulatory Visit | Attending: Family Medicine | Admitting: Family Medicine

## 2022-11-27 DIAGNOSIS — Z1231 Encounter for screening mammogram for malignant neoplasm of breast: Secondary | ICD-10-CM | POA: Diagnosis not present

## 2022-11-28 DIAGNOSIS — E119 Type 2 diabetes mellitus without complications: Secondary | ICD-10-CM | POA: Diagnosis not present

## 2022-12-09 DIAGNOSIS — E119 Type 2 diabetes mellitus without complications: Secondary | ICD-10-CM | POA: Diagnosis not present

## 2022-12-23 DIAGNOSIS — E782 Mixed hyperlipidemia: Secondary | ICD-10-CM | POA: Diagnosis not present

## 2022-12-23 DIAGNOSIS — K219 Gastro-esophageal reflux disease without esophagitis: Secondary | ICD-10-CM | POA: Diagnosis not present

## 2022-12-23 DIAGNOSIS — M353 Polymyalgia rheumatica: Secondary | ICD-10-CM | POA: Diagnosis not present

## 2022-12-23 DIAGNOSIS — D649 Anemia, unspecified: Secondary | ICD-10-CM | POA: Diagnosis not present

## 2022-12-23 DIAGNOSIS — M85851 Other specified disorders of bone density and structure, right thigh: Secondary | ICD-10-CM | POA: Diagnosis not present

## 2022-12-23 DIAGNOSIS — L309 Dermatitis, unspecified: Secondary | ICD-10-CM | POA: Diagnosis not present

## 2022-12-23 DIAGNOSIS — E119 Type 2 diabetes mellitus without complications: Secondary | ICD-10-CM | POA: Diagnosis not present

## 2022-12-23 DIAGNOSIS — I10 Essential (primary) hypertension: Secondary | ICD-10-CM | POA: Diagnosis not present

## 2023-01-15 DIAGNOSIS — L2089 Other atopic dermatitis: Secondary | ICD-10-CM | POA: Diagnosis not present

## 2023-02-18 DIAGNOSIS — H401131 Primary open-angle glaucoma, bilateral, mild stage: Secondary | ICD-10-CM | POA: Diagnosis not present

## 2023-02-27 DIAGNOSIS — E119 Type 2 diabetes mellitus without complications: Secondary | ICD-10-CM | POA: Diagnosis not present

## 2023-03-19 DIAGNOSIS — E119 Type 2 diabetes mellitus without complications: Secondary | ICD-10-CM | POA: Diagnosis not present

## 2023-03-19 DIAGNOSIS — L810 Postinflammatory hyperpigmentation: Secondary | ICD-10-CM | POA: Diagnosis not present

## 2023-03-19 DIAGNOSIS — L2089 Other atopic dermatitis: Secondary | ICD-10-CM | POA: Diagnosis not present

## 2023-03-26 ENCOUNTER — Encounter: Payer: Self-pay | Admitting: Internal Medicine

## 2023-03-26 ENCOUNTER — Ambulatory Visit (INDEPENDENT_AMBULATORY_CARE_PROVIDER_SITE_OTHER): Payer: 59 | Admitting: Internal Medicine

## 2023-03-26 VITALS — BP 122/76 | HR 93 | Ht <= 58 in | Wt 151.0 lb

## 2023-03-26 DIAGNOSIS — E1165 Type 2 diabetes mellitus with hyperglycemia: Secondary | ICD-10-CM

## 2023-03-26 DIAGNOSIS — E785 Hyperlipidemia, unspecified: Secondary | ICD-10-CM

## 2023-03-26 DIAGNOSIS — Z7984 Long term (current) use of oral hypoglycemic drugs: Secondary | ICD-10-CM

## 2023-03-26 LAB — POCT GLYCOSYLATED HEMOGLOBIN (HGB A1C): Hemoglobin A1C: 7.6 % — AB (ref 4.0–5.6)

## 2023-03-26 MED ORDER — GLIPIZIDE 5 MG PO TABS: 5.0000 mg | ORAL_TABLET | Freq: Two times a day (BID) | ORAL | 3 refills | Status: DC

## 2023-03-26 MED ORDER — EZETIMIBE 10 MG PO TABS: 10.0000 mg | ORAL_TABLET | Freq: Every day | ORAL | 3 refills | Status: DC

## 2023-03-26 MED ORDER — ATORVASTATIN CALCIUM 20 MG PO TABS: 20.0000 mg | ORAL_TABLET | Freq: Every day | ORAL | 3 refills | Status: DC

## 2023-03-26 NOTE — Patient Instructions (Signed)
-   Continue Metformin 1000 mg, 1 tablet with Breakfast and 1 tablet with supper - Increase Glipizide 5 mg, 1 tablet before Breakfast , 1 tablet  before Supper - Stop Pravastatin ( Cholesterol) - Start Atorvastatin 20 mg at night time - Continue Zetia 10 mg at night time       HOW TO TREAT LOW BLOOD SUGARS (Blood sugar LESS THAN 70 MG/DL) Please follow the RULE OF 15 for the treatment of hypoglycemia treatment (when your (blood sugars are less than 70 mg/dL)   STEP 1: Take 15 grams of carbohydrates when your blood sugar is low, which includes:  3-4 GLUCOSE TABS  OR 3-4 OZ OF JUICE OR REGULAR SODA OR ONE TUBE OF GLUCOSE GEL    STEP 2: RECHECK blood sugar in 15 MINUTES STEP 3: If your blood sugar is still low at the 15 minute recheck --> then, go back to STEP 1 and treat AGAIN with another 15 grams of carbohydrates.

## 2023-03-26 NOTE — Progress Notes (Signed)
Name: Kelly Farmer  Age/ Sex: 82 y.o., female   MRN/ DOB: [id number], 10/29/1941     PCP: Tally Joe, MD   Reason for Endocrinology Evaluation: Type 2 Diabetes Mellitus  Initial Endocrine Consultative Visit: 01/10/2020    PATIENT IDENTIFIER: [name] is a 82 y.o. female with a past medical history of HTN,T2DM and Dyslipidemia. The patient has followed with Endocrinology clinic since 01/10/2020 for consultative assistance with management of her diabetes.  DIABETIC HISTORY:  [name] was diagnosed with DM in 1970's. Januvia was cost prohibitive. Jardiance caused rash . Her hemoglobin A1c has ranged from 7.2% in 2019, peaking at 8.6% in 2020  On her initial visit to our clinic she had an A1c of 8.0% She was on metformin only. Glipizide was added  SUBJECTIVE:   During the last visit (09/25/2022): A1c 7.0%   Today (09/12/2020): [name]  She checks her blood sugars 1 times daily, fasting . The patient has not had hypoglycemic episodes since the last clinic visit.   Denies nausea, vomiting  Denies constipation or diarrhea   Has established with dermatology for Eczema Corin Pesci   HOME DIABETES REGIMEN:  Metformin 1000 mg, 1 tablet with Breakfast and 1 tablet with supper Glipizide 5 mg, 1 tablet before Breakfast and Half a tablet before Supper   METER DOWNLOAD SUMMARY: D 30 day average 161 mg/dL   782-956 mg/dL    DIABETIC COMPLICATIONS: Microvascular complications:    Denies: neuropathy, retinopathy and CKD Last eye exam: Completed 09/17/2022   Macrovascular complications:    Denies: CAD, PVD, CVA   HISTORY:  Past Medical History:  Past Medical History:  Diagnosis Date   Abdominal pain    HOSP AT [facility] 02/13/12  TO  02/21/12 -DISCHARGE DX RUQ ABSCESS-MOST LIKELY GANGRENOUS CHLOLECYSTITIS WITH FISTULA TO THE COLON WITH ABSCESS;  DRAINAGE OF THE ABSCESS AND PLACEMENT OF DRAIN BY IR--PT STILL HAS THE DRAIN  TO DRAINAGE BAG AND IS ON ANTIBIOTIC    Arthritis    Diabetes mellitus    ORAL MED-NO INSULIN   GERD (gastroesophageal reflux disease)    OCCAS   Hypercholesteremia    Hypertension    Past Surgical History:  Past Surgical History:  Procedure Laterality Date   ABDOMINAL HYSTERECTOMY     APPENDECTOMY     CHOLECYSTECTOMY  04/10/2012   Procedure: CHOLECYSTECTOMY;  Surgeon: Velora Heckler, MD;  [facility];  Service: General;;   COLOSTOMY REVISION  04/10/2012   Procedure: COLON RESECTION RIGHT;  Surgeon: Velora Heckler, MD;  [facility];  Service: General;;   ESOPHAGOGASTRODUODENOSCOPY  02/14/2012   Procedure: ESOPHAGOGASTRODUODENOSCOPY (EGD);  Surgeon: Graylin Shiver, MD;  Location: [facility];  Service: Endoscopy;  Laterality: N/A;   LAPAROTOMY  04/10/2012   Procedure: EXPLORATORY LAPAROTOMY;  Surgeon: Velora Heckler, MD;  [facility];  Service: General;;   Social History:  reports that she has quit smoking. She has never used smokeless tobacco. She reports that she does not drink alcohol and does not use drugs. Family History:  Family History  Problem Relation Age of Onset   Cancer Sister        breast   Breast cancer Sister    Breast cancer Sister    Breast cancer Other    Breast cancer Other    Breast cancer Other      HOME MEDICATIONS: Allergies as of 03/26/2023       Reactions   Pioglitazone Other (See Comments)  Medication List        Accurate as of Mar 26, 2023 12:28 PM. If you have any questions, ask your nurse or doctor.          STOP taking these medications    pravastatin 40 MG tablet Commonly known as: PRAVACHOL Stopped by: Scarlette Shorts, MD       TAKE these medications    aspirin 325 MG tablet Take 325 mg by mouth daily with breakfast.   atorvastatin 20 MG tablet Commonly known as: LIPITOR Take 1 tablet (20 mg total) by mouth daily. Started by: Scarlette Shorts, MD   CALCIUM 600-D PO Take by mouth.   ezetimibe 10 MG tablet Commonly known as:  ZETIA Take 1 tablet (10 mg total) by mouth daily. AT NIGHT   glipiZIDE 5 MG tablet Commonly known as: GLUCOTROL Take 1 tablet (5 mg total) by mouth 2 (two) times daily before a meal. What changed: See the new instructions. Changed by: Scarlette Shorts, MD   hydrOXYzine 25 MG capsule Commonly known as: VISTARIL TAKE ONE CAPSULE BY MOUTH EVERY 8 HOURS AS NEEDED   latanoprost 0.005 % ophthalmic solution Commonly known as: XALATAN INSTILL 1 DROP INTO BOTH EYES EVERY DAY AT NIGHT   losartan-hydrochlorothiazide 100-12.5 MG tablet Commonly known as: HYZAAR Take 1 tablet by mouth daily.   metFORMIN 1000 MG tablet Commonly known as: GLUCOPHAGE Take 1 tablet (1,000 mg total) by mouth 2 (two) times daily with a meal.   multivitamin with minerals Tabs tablet Take 1 tablet by mouth daily.   OneTouch Ultra test strip Generic drug: glucose blood 1 each by Other route daily in the afternoon. Use as instructed   telmisartan-hydrochlorothiazide 80-12.5 MG tablet Commonly known as: MICARDIS HCT Take 1 tablet by mouth daily with breakfast.   triamcinolone ointment 0.1 % Commonly known as: KENALOG APPLY ON THE SKIN NIGHTLY APPLY TO RED ITCHY AREAS, TAPER AS ABLE         OBJECTIVE:   Vital Signs: BP 122/76 (BP Location: Left Arm, Patient Position: Sitting, Cuff Size: Large)   Pulse 93   Ht 4\' 10"  (1.473 m)   Wt 151 lb (68.5 kg)   SpO2 99%   BMI 31.56 kg/m   Wt Readings from Last 3 Encounters:  03/26/23 151 lb (68.5 kg)  09/25/22 150 lb 9.6 oz (68.3 kg)  03/20/22 154 lb 3.2 oz (69.9 kg)     Exam: General: Pt appears well and is in NAD Pt with scattered hives over the arms and chest area, target lesion on the right foot   Lungs: Clear with good BS bilat   Heart: RRR   Extremities: Trace   pretibial edema.   Neuro: MS is good with appropriate affect, pt is alert and Ox3   DM foot exam: 03/26/2023   The skin of the feet is intact without sores or ulcerations. The  pedal pulses are 2+ on right and 2+ on left. The sensation is intact to a screening 5.07, 10 gram monofilament bilaterally    DATA REVIEWED:  Lab Results  Component Value Date   HGBA1C 7.6 (A) 03/26/2023   HGBA1C 7.0 (A) 09/25/2022   HGBA1C 7.3 (A) 03/20/2022   12/23/2022 BUN 17 Cr. 0.560 GFR 91 LDL 126  Tg 114  ASSESSMENT / PLAN / RECOMMENDATIONS:   1) Type 2 Diabetes Mellitus,Sub- OPtimally controlled, Without complications - Most recent A1c of 7.6 %. Goal A1c < 8.0 %.    -Her A1c has increased  from 7.0% to 7.6% -She assures me compliance with her medications -I suspect dietary indiscretions, because her LDL has increased as well -I will increase glipizide as below -Januvia has been cost prohibitive in the past -Jardiance caused genital rash   MEDICATIONS: Increase glipizide 5 mg, 1 tablet before Breakfast and 1 tablet before Supper Continue Metformin 1000 mg BID   EDUCATION / INSTRUCTIONS: BG monitoring instructions: Patient is instructed to check her blood sugars 1 times a day, fasting . Call [facility] if: BG persistently < 70 . I reviewed the Rule of 15 for the treatment of hypoglycemia in detail with the patient. Literature supplied.   2) Diabetic complications:  Eye: Does not have known diabetic retinopathy.  Neuro/ Feet: Does not have known diabetic peripheral neuropathy. Renal: Patient does note have known baseline CKD. She is on an ACEI/ARB at present.  3) Dyslipidemia :   - LDL above goal , have recommended switching pravastatin to atorvastatin, patient is in agreement -Discussed low-fat diet  Medication Stop pravastatin 40 mg Start atorvastatin 20 mg daily Continue Zetia 10 mg daily  F/U in 6 months    Signed electronically by: Lyndle Herrlich, MD  [facility]  [facility] [address] [place] [place], [place] [address] Phone: [phone] FAX: [phone]   CC: Tally Joe,  MD [address] [place] [address] Phone: [phone]  Fax: [phone]  Return to Endocrinology clinic as below: Future Appointments  Date Time Provider Department Center  09/29/2023 11:50 AM Quinlee Sciarra, Konrad Dolores, MD [facility] None

## 2023-04-22 DIAGNOSIS — E119 Type 2 diabetes mellitus without complications: Secondary | ICD-10-CM | POA: Diagnosis not present

## 2023-05-29 DIAGNOSIS — E119 Type 2 diabetes mellitus without complications: Secondary | ICD-10-CM | POA: Diagnosis not present

## 2023-06-28 DIAGNOSIS — E119 Type 2 diabetes mellitus without complications: Secondary | ICD-10-CM | POA: Diagnosis not present

## 2023-07-03 ENCOUNTER — Other Ambulatory Visit: Payer: Self-pay | Admitting: Family Medicine

## 2023-07-03 DIAGNOSIS — M85851 Other specified disorders of bone density and structure, right thigh: Secondary | ICD-10-CM

## 2023-07-14 DIAGNOSIS — D649 Anemia, unspecified: Secondary | ICD-10-CM | POA: Diagnosis not present

## 2023-07-14 DIAGNOSIS — E11620 Type 2 diabetes mellitus with diabetic dermatitis: Secondary | ICD-10-CM | POA: Diagnosis not present

## 2023-07-14 DIAGNOSIS — E782 Mixed hyperlipidemia: Secondary | ICD-10-CM | POA: Diagnosis not present

## 2023-07-14 DIAGNOSIS — M85851 Other specified disorders of bone density and structure, right thigh: Secondary | ICD-10-CM | POA: Diagnosis not present

## 2023-07-14 DIAGNOSIS — Z1211 Encounter for screening for malignant neoplasm of colon: Secondary | ICD-10-CM | POA: Diagnosis not present

## 2023-07-14 DIAGNOSIS — K219 Gastro-esophageal reflux disease without esophagitis: Secondary | ICD-10-CM | POA: Diagnosis not present

## 2023-07-14 DIAGNOSIS — L309 Dermatitis, unspecified: Secondary | ICD-10-CM | POA: Diagnosis not present

## 2023-07-14 DIAGNOSIS — M353 Polymyalgia rheumatica: Secondary | ICD-10-CM | POA: Diagnosis not present

## 2023-07-14 DIAGNOSIS — Z0000 Encounter for general adult medical examination without abnormal findings: Secondary | ICD-10-CM | POA: Diagnosis not present

## 2023-07-14 DIAGNOSIS — I10 Essential (primary) hypertension: Secondary | ICD-10-CM | POA: Diagnosis not present

## 2023-07-14 DIAGNOSIS — Z23 Encounter for immunization: Secondary | ICD-10-CM | POA: Diagnosis not present

## 2023-09-18 DIAGNOSIS — H401130 Primary open-angle glaucoma, bilateral, stage unspecified: Secondary | ICD-10-CM | POA: Diagnosis not present

## 2023-09-18 DIAGNOSIS — E119 Type 2 diabetes mellitus without complications: Secondary | ICD-10-CM | POA: Diagnosis not present

## 2023-09-24 DIAGNOSIS — E119 Type 2 diabetes mellitus without complications: Secondary | ICD-10-CM | POA: Diagnosis not present

## 2023-09-24 DIAGNOSIS — L810 Postinflammatory hyperpigmentation: Secondary | ICD-10-CM | POA: Diagnosis not present

## 2023-09-24 DIAGNOSIS — L2089 Other atopic dermatitis: Secondary | ICD-10-CM | POA: Diagnosis not present

## 2023-09-29 ENCOUNTER — Ambulatory Visit (INDEPENDENT_AMBULATORY_CARE_PROVIDER_SITE_OTHER): Payer: 59 | Admitting: Internal Medicine

## 2023-09-29 ENCOUNTER — Encounter: Payer: Self-pay | Admitting: Internal Medicine

## 2023-09-29 VITALS — BP 140/70 | HR 106 | Ht <= 58 in | Wt 145.2 lb

## 2023-09-29 DIAGNOSIS — E785 Hyperlipidemia, unspecified: Secondary | ICD-10-CM

## 2023-09-29 DIAGNOSIS — E119 Type 2 diabetes mellitus without complications: Secondary | ICD-10-CM | POA: Diagnosis not present

## 2023-09-29 DIAGNOSIS — Z7984 Long term (current) use of oral hypoglycemic drugs: Secondary | ICD-10-CM | POA: Diagnosis not present

## 2023-09-29 LAB — POCT GLYCOSYLATED HEMOGLOBIN (HGB A1C): Hemoglobin A1C: 6.9 % — AB (ref 4.0–5.6)

## 2023-09-29 NOTE — Patient Instructions (Signed)
-   Continue Metformin 1000 mg, 1 tablet with Breakfast and 1 tablet with supper - Continue Glipizide 5 mg, 1 tablet before Breakfast , 1 tablet  before Supper     HOW TO TREAT LOW BLOOD SUGARS (Blood sugar LESS THAN 70 MG/DL) Please follow the RULE OF 15 for the treatment of hypoglycemia treatment (when your (blood sugars are less than 70 mg/dL)   STEP 1: Take 15 grams of carbohydrates when your blood sugar is low, which includes:  3-4 GLUCOSE TABS  OR 3-4 OZ OF JUICE OR REGULAR SODA OR ONE TUBE OF GLUCOSE GEL    STEP 2: RECHECK blood sugar in 15 MINUTES STEP 3: If your blood sugar is still low at the 15 minute recheck --> then, go back to STEP 1 and treat AGAIN with another 15 grams of carbohydrates.

## 2023-09-29 NOTE — Progress Notes (Signed)
Name: Kelly Farmer  Age/ Sex: 82 y.o., female   MRN/ DOB: [id number], January 22, 1941     PCP: Tally Joe, MD   Reason for Endocrinology Evaluation: Type 2 Diabetes Mellitus  Initial Endocrine Consultative Visit: 01/10/2020    PATIENT IDENTIFIER: Kelly Farmer is a 82 y.o. female with a past medical history of HTN,T2DM and Dyslipidemia. The patient has followed with Endocrinology clinic since 01/10/2020 for consultative assistance with management of her diabetes.  DIABETIC HISTORY:  Kelly Farmer was diagnosed with DM in 1970's. Januvia was cost prohibitive. Jardiance caused rash . Her hemoglobin A1c has ranged from 7.2% in 2019, peaking at 8.6% in 2020  On her initial visit to our clinic she had an A1c of 8.0% She was on metformin only. Glipizide was added  SUBJECTIVE:   During the last visit (03/26/2023): A1c 7.6%   Today (09/12/2020): Kelly Farmer  She checks her blood sugars 1 times daily, fasting . The patient has not had hypoglycemic episodes since the last clinic visit.   Denies nausea, vomiting  Denies constipation or diarrhea   Has established with dermatology for Eczema Kelly Farmer   HOME DIABETES REGIMEN:  Metformin 1000 mg, 1 tablet with Breakfast and 1 tablet with supper Glipizide 5 mg, 1 tablet before Breakfast and 1 tablet before Supper Atorvastatin 20 mg daily Zetia 10 mg daily- not taking      METER DOWNLOAD SUMMARY: D 30 day average 131mg /dL   88- 409  - mg/dL    DIABETIC COMPLICATIONS: Microvascular complications:    Denies: neuropathy, retinopathy and CKD Last eye exam: Completed 02/18/2023   Macrovascular complications:    Denies: CAD, PVD, CVA   HISTORY:  Past Medical History:  Past Medical History:  Diagnosis Date   Abdominal pain    HOSP AT [facility] 02/13/12  TO  02/21/12 -DISCHARGE DX RUQ ABSCESS-MOST LIKELY GANGRENOUS CHLOLECYSTITIS WITH FISTULA TO THE COLON WITH ABSCESS;  DRAINAGE OF THE ABSCESS AND PLACEMENT OF DRAIN BY IR--PT  STILL HAS THE DRAIN  TO DRAINAGE BAG AND IS ON ANTIBIOTIC   Arthritis    Diabetes mellitus    ORAL MED-NO INSULIN   GERD (gastroesophageal reflux disease)    OCCAS   Hypercholesteremia    Hypertension    Past Surgical History:  Past Surgical History:  Procedure Laterality Date   ABDOMINAL HYSTERECTOMY     APPENDECTOMY     CHOLECYSTECTOMY  04/10/2012   Procedure: CHOLECYSTECTOMY;  Surgeon: Velora Heckler, MD;  [facility];  Service: General;;   COLOSTOMY REVISION  04/10/2012   Procedure: COLON RESECTION RIGHT;  Surgeon: Velora Heckler, MD;  [facility];  Service: General;;   ESOPHAGOGASTRODUODENOSCOPY  02/14/2012   Procedure: ESOPHAGOGASTRODUODENOSCOPY (EGD);  Surgeon: Graylin Shiver, MD;  Location: [facility];  Service: Endoscopy;  Laterality: N/A;   LAPAROTOMY  04/10/2012   Procedure: EXPLORATORY LAPAROTOMY;  Surgeon: Velora Heckler, MD;  [facility];  Service: General;;   Social History:  reports that she has quit smoking. She has never used smokeless tobacco. She reports that she does not drink alcohol and does not use drugs. Family History:  Family History  Problem Relation Age of Onset   Cancer Sister        breast   Breast cancer Sister    Breast cancer Sister    Breast cancer Other    Breast cancer Other    Breast cancer Other      HOME MEDICATIONS: Allergies as of 09/29/2023  Reactions   Pioglitazone Other (See Comments)        Medication List        Accurate as of September 29, 2023 12:07 PM. If you have any questions, ask your nurse or doctor.          aspirin 325 MG tablet Take 325 mg by mouth daily with breakfast.   atorvastatin 20 MG tablet Commonly known as: LIPITOR Take 1 tablet (20 mg total) by mouth daily.   CALCIUM 600-D PO Take by mouth.   ezetimibe 10 MG tablet Commonly known as: ZETIA Take 1 tablet (10 mg total) by mouth daily. AT NIGHT   glipiZIDE 5 MG tablet Commonly known as: GLUCOTROL Take 1 tablet (5 mg total)  by mouth 2 (two) times daily before a meal.   hydrOXYzine 25 MG capsule Commonly known as: VISTARIL TAKE ONE CAPSULE BY MOUTH EVERY 8 HOURS AS NEEDED   latanoprost 0.005 % ophthalmic solution Commonly known as: XALATAN INSTILL 1 DROP INTO BOTH EYES EVERY DAY AT NIGHT   losartan-hydrochlorothiazide 100-12.5 MG tablet Commonly known as: HYZAAR Take 1 tablet by mouth daily.   metFORMIN 1000 MG tablet Commonly known as: GLUCOPHAGE Take 1 tablet (1,000 mg total) by mouth 2 (two) times daily with a meal.   multivitamin with minerals Tabs tablet Take 1 tablet by mouth daily.   OneTouch Ultra test strip Generic drug: glucose blood 1 each by Other route daily in the afternoon. Use as instructed   telmisartan-hydrochlorothiazide 80-12.5 MG tablet Commonly known as: MICARDIS HCT Take 1 tablet by mouth daily with breakfast.   triamcinolone ointment 0.1 % Commonly known as: KENALOG APPLY ON THE SKIN NIGHTLY APPLY TO RED ITCHY AREAS, TAPER AS ABLE         OBJECTIVE:   Vital Signs: BP (!) 140/70   Pulse (!) 106   Ht 4\' 10"  (1.473 m)   Wt 145 lb 3.2 oz (65.9 kg)   SpO2 96%   BMI 30.35 kg/m   Wt Readings from Last 3 Encounters:  09/29/23 145 lb 3.2 oz (65.9 kg)  03/26/23 151 lb (68.5 kg)  09/25/22 150 lb 9.6 oz (68.3 kg)     Exam: General: Pt appears well and is in NAD  Lungs: Clear with good BS bilat   Heart: RRR   Extremities: Trace   pretibial edema.   Neuro: MS is good with appropriate affect, pt is alert and Ox3   DM foot exam: 03/26/2023   The skin of the feet is intact without sores or ulcerations. The pedal pulses are 2+ on right and 2+ on left. The sensation is intact to a screening 5.07, 10 gram monofilament bilaterally    DATA REVIEWED:  Lab Results  Component Value Date   HGBA1C 6.9 (A) 09/29/2023   HGBA1C 7.6 (A) 03/26/2023   HGBA1C 7.0 (A) 09/25/2022   07/14/2023 BUN 13 Cr. 0.560 GFR 90 LDL 76 Tg 73 HDL 74  ASSESSMENT / PLAN /  RECOMMENDATIONS:   1) Type 2 Diabetes Mellitus,OPtimally controlled, Without complications - Most recent A1c of 7.6 %. Goal A1c < 7.5 %.    -A1c optimal without hypoglycemia -Januvia has been cost prohibitive in the past -Jardiance caused genital rash -Labs through PCPs office were reviewed with normal GFR   MEDICATIONS: Continue Glipizide 5 mg, 1 tablet before Breakfast and 1 tablet before Supper Continue Metformin 1000 mg BID   EDUCATION / INSTRUCTIONS: BG monitoring instructions: Patient is instructed to check her blood sugars 1 times  a day, fasting . Call [facility] if: BG persistently < 70 . I reviewed the Rule of 15 for the treatment of hypoglycemia in detail with the patient. Literature supplied.   2) Diabetic complications:  Eye: Does not have known diabetic retinopathy.  Neuro/ Feet: Does not have known diabetic peripheral neuropathy. Renal: Patient does note have known baseline CKD. She is on an ACEI/ARB at present.  3) Dyslipidemia :  -I had switched pravastatin to atorvastatin 03/2023 due to an elevated LDL -LDL and triglycerides have been optimized, she has not been taking Zetia, we will continue atorvastatin -Lipid panel reviewed through PCPs office  Medication  Continue atorvastatin 20 mg daily Stop Zetia 10 mg daily  F/U in 6 months    Signed electronically by: Lyndle Herrlich, MD  [facility]  [facility] [address] [place], [place] [address] Phone: [phone] FAX: [phone]   CC: Tally Joe, MD [address] [place] [address] Phone: [phone]  Fax: [phone]  Return to Endocrinology clinic as below: Future Appointments  Date Time Provider Department Center  03/22/2024  8:30 AM [facility] DX DEXA 1 [facility]

## 2023-11-25 DIAGNOSIS — L2089 Other atopic dermatitis: Secondary | ICD-10-CM | POA: Diagnosis not present

## 2023-11-25 DIAGNOSIS — L810 Postinflammatory hyperpigmentation: Secondary | ICD-10-CM | POA: Diagnosis not present

## 2023-11-29 DIAGNOSIS — E119 Type 2 diabetes mellitus without complications: Secondary | ICD-10-CM | POA: Diagnosis not present

## 2023-12-26 DIAGNOSIS — E119 Type 2 diabetes mellitus without complications: Secondary | ICD-10-CM | POA: Diagnosis not present

## 2024-01-02 ENCOUNTER — Other Ambulatory Visit: Payer: Self-pay | Admitting: Family Medicine

## 2024-01-02 DIAGNOSIS — Z1231 Encounter for screening mammogram for malignant neoplasm of breast: Secondary | ICD-10-CM

## 2024-01-19 DIAGNOSIS — M353 Polymyalgia rheumatica: Secondary | ICD-10-CM | POA: Diagnosis not present

## 2024-01-19 DIAGNOSIS — I10 Essential (primary) hypertension: Secondary | ICD-10-CM | POA: Diagnosis not present

## 2024-01-19 DIAGNOSIS — K219 Gastro-esophageal reflux disease without esophagitis: Secondary | ICD-10-CM | POA: Diagnosis not present

## 2024-01-19 DIAGNOSIS — E1169 Type 2 diabetes mellitus with other specified complication: Secondary | ICD-10-CM | POA: Diagnosis not present

## 2024-01-19 DIAGNOSIS — D649 Anemia, unspecified: Secondary | ICD-10-CM | POA: Diagnosis not present

## 2024-01-19 DIAGNOSIS — E782 Mixed hyperlipidemia: Secondary | ICD-10-CM | POA: Diagnosis not present

## 2024-01-19 DIAGNOSIS — M85851 Other specified disorders of bone density and structure, right thigh: Secondary | ICD-10-CM | POA: Diagnosis not present

## 2024-01-27 ENCOUNTER — Other Ambulatory Visit: Payer: Self-pay | Admitting: Internal Medicine

## 2024-01-27 ENCOUNTER — Ambulatory Visit
Admission: RE | Admit: 2024-01-27 | Discharge: 2024-01-27 | Disposition: A | Discharge status: Home / Self Care | Payer: 59 | Source: Ambulatory Visit | Attending: Family Medicine | Admitting: Family Medicine

## 2024-01-27 DIAGNOSIS — Z1231 Encounter for screening mammogram for malignant neoplasm of breast: Secondary | ICD-10-CM

## 2024-02-26 DIAGNOSIS — E119 Type 2 diabetes mellitus without complications: Secondary | ICD-10-CM | POA: Diagnosis not present

## 2024-03-16 DIAGNOSIS — H401112 Primary open-angle glaucoma, right eye, moderate stage: Secondary | ICD-10-CM | POA: Diagnosis not present

## 2024-03-22 ENCOUNTER — Other Ambulatory Visit: Payer: 59

## 2024-03-30 ENCOUNTER — Ambulatory Visit (INDEPENDENT_AMBULATORY_CARE_PROVIDER_SITE_OTHER): Payer: 59 | Admitting: Internal Medicine

## 2024-03-30 ENCOUNTER — Encounter: Payer: Self-pay | Admitting: Internal Medicine

## 2024-03-30 VITALS — BP 122/68 | HR 97 | Ht <= 58 in | Wt 148.0 lb

## 2024-03-30 DIAGNOSIS — E119 Type 2 diabetes mellitus without complications: Secondary | ICD-10-CM | POA: Diagnosis not present

## 2024-03-30 DIAGNOSIS — Z7984 Long term (current) use of oral hypoglycemic drugs: Secondary | ICD-10-CM | POA: Diagnosis not present

## 2024-03-30 LAB — POCT GLUCOSE (DEVICE FOR HOME USE): Glucose Fasting, POC: 119 mg/dL — AB (ref 70–99)

## 2024-03-30 LAB — POCT GLYCOSYLATED HEMOGLOBIN (HGB A1C): Hemoglobin A1C: 7 % — AB (ref 4.0–5.6)

## 2024-03-30 MED ORDER — METFORMIN HCL 1000 MG PO TABS
1000.0000 mg | ORAL_TABLET | Freq: Two times a day (BID) | ORAL | 3 refills | Status: DC
Start: 2024-03-30 — End: 2024-05-24

## 2024-03-30 MED ORDER — METFORMIN HCL 1000 MG PO TABS: 1000.0000 mg | ORAL_TABLET | Freq: Two times a day (BID) | ORAL | 3 refills | Status: DC

## 2024-03-30 MED ORDER — GLIPIZIDE 5 MG PO TABS: 5.0000 mg | ORAL_TABLET | Freq: Two times a day (BID) | ORAL | 3 refills | Status: DC

## 2024-03-30 NOTE — Patient Instructions (Signed)
-   Continue Metformin 1000 mg, 1 tablet with Breakfast and 1 tablet with supper - Continue Glipizide 5 mg, 1 tablet before Breakfast , 1 tablet  before Supper     HOW TO TREAT LOW BLOOD SUGARS (Blood sugar LESS THAN 70 MG/DL) Please follow the RULE OF 15 for the treatment of hypoglycemia treatment (when your (blood sugars are less than 70 mg/dL)   STEP 1: Take 15 grams of carbohydrates when your blood sugar is low, which includes:  3-4 GLUCOSE TABS  OR 3-4 OZ OF JUICE OR REGULAR SODA OR ONE TUBE OF GLUCOSE GEL    STEP 2: RECHECK blood sugar in 15 MINUTES STEP 3: If your blood sugar is still low at the 15 minute recheck --> then, go back to STEP 1 and treat AGAIN with another 15 grams of carbohydrates.

## 2024-03-30 NOTE — Progress Notes (Signed)
 Name: Kelly Farmer  Age/ Sex: 83 y.o., female   MRN/ DOB: [id number], [birth year]     PCP: Rae Bugler, MD   Reason for Endocrinology Evaluation: Type 2 Diabetes Mellitus  Initial Endocrine Consultative Visit: 01/10/2020    PATIENT IDENTIFIER: Kelly Farmer is a 83 y.o. female with a past medical history of HTN,T2DM and Dyslipidemia. The patient has followed with Endocrinology clinic since 01/10/2020 for consultative assistance with management of her diabetes.  DIABETIC HISTORY:  Ms. Mahrt was diagnosed with DM in 1970's. Januvia was cost prohibitive. Jardiance caused rash . Her hemoglobin A1c has ranged from 7.2% in 2019, peaking at 8.6% in 2020  On her initial visit to our clinic she had an A1c of 8.0% She was on metformin  only. Glipizide  was added  SUBJECTIVE:   During the last visit (09/29/2023): A1c 7.6%   Today (09/12/2020): Kelly Farmer  She checks her blood sugars 1 times daily, fasting . The patient has not had hypoglycemic episodes since the last clinic visit.   Denies nausea, vomiting  Denies constipation or diarrhea    HOME DIABETES REGIMEN:  Metformin  1000 mg, 1 tablet with Breakfast and 1 tablet with supper Glipizide  5 mg, 1 tablet before Breakfast and 1 tablet before Supper Atorvastatin  20 mg daily     METER DOWNLOAD SUMMARY:  n/a   DIABETIC COMPLICATIONS: Microvascular complications:    Denies: neuropathy, retinopathy and CKD Last eye exam: Completed 03/2024   Macrovascular complications:    Denies: CAD, PVD, CVA   HISTORY:  Past Medical History:  Past Medical History:  Diagnosis Date   Abdominal pain    HOSP AT [facility] 02/13/12  TO  02/21/12 -DISCHARGE DX RUQ ABSCESS-MOST LIKELY GANGRENOUS CHLOLECYSTITIS WITH FISTULA TO THE COLON WITH ABSCESS;  DRAINAGE OF THE ABSCESS AND PLACEMENT OF DRAIN BY IR--PT STILL HAS THE DRAIN  TO DRAINAGE BAG AND IS ON ANTIBIOTIC   Arthritis    Diabetes mellitus    ORAL MED-NO INSULIN    GERD  (gastroesophageal reflux disease)    OCCAS   Hypercholesteremia    Hypertension    Past Surgical History:  Past Surgical History:  Procedure Laterality Date   ABDOMINAL HYSTERECTOMY     APPENDECTOMY     CHOLECYSTECTOMY  04/10/2012   Procedure: CHOLECYSTECTOMY;  Surgeon: Keitha Pata, MD;  [facility];  Service: General;;   COLOSTOMY REVISION  04/10/2012   Procedure: COLON RESECTION RIGHT;  Surgeon: Keitha Pata, MD;  [facility];  Service: General;;   ESOPHAGOGASTRODUODENOSCOPY  02/14/2012   Procedure: ESOPHAGOGASTRODUODENOSCOPY (EGD);  Surgeon: Celedonio Coil, MD;  Location: [facility];  Service: Endoscopy;  Laterality: N/A;   LAPAROTOMY  04/10/2012   Procedure: EXPLORATORY LAPAROTOMY;  Surgeon: Keitha Pata, MD;  [facility];  Service: General;;   Social History:  reports that she has quit smoking. She has never used smokeless tobacco. She reports that she does not drink alcohol and does not use drugs. Family History:  Family History  Problem Relation Age of Onset   Breast cancer Sister    Breast cancer Sister    Breast cancer Other    Breast cancer Other    Breast cancer Other      HOME MEDICATIONS: Allergies as of 03/30/2024       Reactions   Pioglitazone Other (See Comments)        Medication List        Accurate as of Mar 30, 2024  9:55 AM. If you have any  questions, ask your nurse or doctor.          acetaminophen  650 MG CR tablet Commonly known as: TYLENOL  1 tablets as needed Orally every 8 hrs   aspirin 325 MG tablet Take 325 mg by mouth daily with breakfast.   atorvastatin  20 MG tablet Commonly known as: LIPITOR Take 1 tablet (20 mg total) by mouth daily.   CALCIUM  600-D PO Take by mouth.   ezetimibe  10 MG tablet Commonly known as: ZETIA  Take 10 mg by mouth daily.   glipiZIDE  5 MG tablet Commonly known as: GLUCOTROL  TAKE 1 TABLET BY MOUTH TWICE DAILY BEFORE MEALS   hydrOXYzine  25 MG capsule Commonly known as: VISTARIL  TAKE  ONE CAPSULE BY MOUTH EVERY 8 HOURS AS NEEDED   latanoprost 0.005 % ophthalmic solution Commonly known as: XALATAN INSTILL 1 DROP INTO BOTH EYES EVERY DAY AT NIGHT   levocetirizine 5 MG tablet Commonly known as: XYZAL Take 5 mg by mouth at bedtime.   losartan-hydrochlorothiazide  100-12.5 MG tablet Commonly known as: HYZAAR Take 1 tablet by mouth daily.   metFORMIN  1000 MG tablet Commonly known as: GLUCOPHAGE  Take 1 tablet (1,000 mg total) by mouth 2 (two) times daily with a meal.   multivitamin with minerals Tabs tablet Take 1 tablet by mouth daily.   OneTouch Ultra test strip Generic drug: glucose blood 1 each by Other route daily in the afternoon. Use as instructed   pimecrolimus 1 % cream Commonly known as: ELIDEL Apply topically.   telmisartan -hydrochlorothiazide  80-12.5 MG tablet Commonly known as: MICARDIS  HCT Take 1 tablet by mouth daily with breakfast.   triamcinolone ointment 0.1 % Commonly known as: KENALOG APPLY ON THE SKIN NIGHTLY APPLY TO RED ITCHY AREAS, TAPER AS ABLE         OBJECTIVE:   Vital Signs: BP 122/68 (BP Location: Left Arm, Patient Position: Sitting, Cuff Size: Normal)   Pulse 97   Ht 4\' 10"  (1.473 m)   Wt 148 lb (67.1 kg)   SpO2 99%   BMI 30.93 kg/m   Wt Readings from Last 3 Encounters:  03/30/24 148 lb (67.1 kg)  09/29/23 145 lb 3.2 oz (65.9 kg)  03/26/23 151 lb (68.5 kg)     Exam: General: Pt appears well and is in NAD  Lungs: Clear with good BS bilat   Heart: RRR   Extremities: No pretibial edema.   Neuro: MS is good with appropriate affect, pt is alert and Ox3   DM foot exam: 03/30/2024   The skin of the feet is intact without sores or ulcerations. The pedal pulses are 2+ on right and 2+ on left. The sensation is intact to a screening 5.07, 10 gram monofilament bilaterally    DATA REVIEWED:  Lab Results  Component Value Date   HGBA1C 7.0 (A) 03/30/2024   HGBA1C 6.9 (A) 09/29/2023   HGBA1C 7.6 (A) 03/26/2023    01/19/2024 BUN 18 GFR 88 LDL 67 Tg 84 HDL 77   In office BG 119 mg/dL   ASSESSMENT / PLAN / RECOMMENDATIONS:   1) Type 2 Diabetes Mellitus,OPtimally controlled, Without complications - Most recent A1c of 7.0 %. Goal A1c < 7.5 %.    -A1c optimal without hypoglycemia -Januvia has been cost prohibitive in the past -Jardiance caused genital rash -Labs through PCPs office were reviewed with normal GFR - Pt was unable to submit a urine sample today    MEDICATIONS: Continue Glipizide  5 mg, 1 tablet before Breakfast and 1 tablet before Supper Continue Metformin   1000 mg BID   EDUCATION / INSTRUCTIONS: BG monitoring instructions: Patient is instructed to check her blood sugars 1 times a day, fasting . Call [facility] if: BG persistently < 70 . I reviewed the Rule of 15 for the treatment of hypoglycemia in detail with the patient. Literature supplied.   2) Diabetic complications:  Eye: Does not have known diabetic retinopathy.  Neuro/ Feet: Does not have known diabetic peripheral neuropathy. Renal: Patient does note have known baseline CKD. She is on an ACEI/ARB at present.  3) Dyslipidemia :  -I had switched pravastatin to atorvastatin  03/2023 due to an elevated LDL -LDL and triglycerides have been optimized, she has not been taking Zetia , we will continue atorvastatin  -Lipid panel reviewed through PCPs office  Medication  Continue atorvastatin  20 mg daily  F/U in 6 months    Signed electronically by: Natale Bail, MD  [facility]  [facility] [address] [place], [place] [address] Phone: [phone] FAX: [phone]   CC: Rae Bugler, MD [address] [place] [address] Phone: [phone]  Fax: [phone]  Return to Endocrinology clinic as below: Future Appointments  Date Time Provider Department Center  03/30/2024 10:10 AM Daryan Buell, Julian Obey, MD [facility]  None

## 2024-04-01 DIAGNOSIS — E119 Type 2 diabetes mellitus without complications: Secondary | ICD-10-CM | POA: Diagnosis not present

## 2024-04-02 ENCOUNTER — Ambulatory Visit: Payer: Self-pay | Admitting: Internal Medicine

## 2024-04-02 LAB — MICROALBUMIN / CREATININE URINE RATIO
Creatinine, Urine: 33 mg/dL (ref 20–275)
Microalb, Ur: <0.2 mg/dL

## 2024-05-24 ENCOUNTER — Ambulatory Visit: Admitting: Internal Medicine

## 2024-05-24 ENCOUNTER — Encounter: Payer: Self-pay | Admitting: Internal Medicine

## 2024-05-24 VITALS — BP 124/70 | HR 88 | Ht <= 58 in | Wt 147.0 lb

## 2024-05-24 DIAGNOSIS — Z7984 Long term (current) use of oral hypoglycemic drugs: Secondary | ICD-10-CM | POA: Diagnosis not present

## 2024-05-24 DIAGNOSIS — E119 Type 2 diabetes mellitus without complications: Secondary | ICD-10-CM | POA: Diagnosis not present

## 2024-05-24 MED ORDER — GLIPIZIDE 5 MG PO TABS: 5.0000 mg | ORAL_TABLET | Freq: Two times a day (BID) | ORAL | 3 refills | Status: AC

## 2024-05-24 MED ORDER — ATORVASTATIN CALCIUM 20 MG PO TABS: 20.0000 mg | ORAL_TABLET | Freq: Every day | ORAL | 3 refills | Status: AC

## 2024-05-24 MED ORDER — METFORMIN HCL 1000 MG PO TABS: 1000.0000 mg | ORAL_TABLET | Freq: Two times a day (BID) | ORAL | 3 refills | Status: AC

## 2024-05-24 NOTE — Patient Instructions (Signed)
-   Continue Metformin 1000 mg, 1 tablet with Breakfast and 1 tablet with supper - Continue Glipizide 5 mg, 1 tablet before Breakfast , 1 tablet  before Supper     HOW TO TREAT LOW BLOOD SUGARS (Blood sugar LESS THAN 70 MG/DL) Please follow the RULE OF 15 for the treatment of hypoglycemia treatment (when your (blood sugars are less than 70 mg/dL)   STEP 1: Take 15 grams of carbohydrates when your blood sugar is low, which includes:  3-4 GLUCOSE TABS  OR 3-4 OZ OF JUICE OR REGULAR SODA OR ONE TUBE OF GLUCOSE GEL    STEP 2: RECHECK blood sugar in 15 MINUTES STEP 3: If your blood sugar is still low at the 15 minute recheck --> then, go back to STEP 1 and treat AGAIN with another 15 grams of carbohydrates.

## 2024-05-24 NOTE — Progress Notes (Signed)
 Name: Kelly Farmer  Age/ Sex: 83 y.o., female   MRN/ DOB: [id number], 1941-02-20     PCP: Kelly Lenis, MD   Reason for Endocrinology Evaluation: Type 2 Diabetes Mellitus  Initial Endocrine Consultative Visit: 01/10/2020    PATIENT IDENTIFIER: [name] is a 83 y.o. female with a past medical history of HTN,T2DM and Dyslipidemia. The patient has followed with Endocrinology clinic since 01/10/2020 for consultative assistance with management of her diabetes.  DIABETIC HISTORY:  Kelly Farmer was diagnosed with DM in 1970's. Januvia was cost prohibitive. Jardiance caused rash . Her hemoglobin A1c has ranged from 7.2% in 2019, peaking at 8.6% in 2020  On her initial visit to our clinic she had an A1c of 8.0% She was on metformin  only. Glipizide  was added  SUBJECTIVE:   During the last visit (03/30/2024): A1c 7.0%   Today (09/12/2020): Kelly Farmer  She checks her blood sugars 1 times daily, fasting .No hypoglycemia    Denies nausea, vomiting  Denies constipation or diarrhea  She has had sinus issues    HOME DIABETES REGIMEN:  Metformin  1000 mg, 1 tablet with Breakfast and 1 tablet with supper Glipizide  5 mg, 1 tablet before Breakfast and 1 tablet before Supper Atorvastatin  20 mg daily   METER DOWNLOAD SUMMARY:  BG 139 mg/dL    DIABETIC COMPLICATIONS: Microvascular complications:    Denies: neuropathy, retinopathy and CKD Last eye exam: Completed 03/2024   Macrovascular complications:    Denies: CAD, PVD, CVA   HISTORY:  Past Medical History:  Past Medical History:  Diagnosis Date   Abdominal pain    HOSP AT [facility] 02/13/12  TO  02/21/12 -DISCHARGE DX RUQ ABSCESS-MOST LIKELY GANGRENOUS CHLOLECYSTITIS WITH FISTULA TO THE COLON WITH ABSCESS;  DRAINAGE OF THE ABSCESS AND PLACEMENT OF DRAIN BY IR--PT STILL HAS THE DRAIN  TO DRAINAGE BAG AND IS ON ANTIBIOTIC   Arthritis    Diabetes mellitus    ORAL MED-NO INSULIN    GERD (gastroesophageal reflux disease)     OCCAS   Hypercholesteremia    Hypertension    Past Surgical History:  Past Surgical History:  Procedure Laterality Date   ABDOMINAL HYSTERECTOMY     APPENDECTOMY     CHOLECYSTECTOMY  04/10/2012   Procedure: CHOLECYSTECTOMY;  Surgeon: Kelly CHRISTELLA Spinner, MD;  Location: [facility];  Service: General;;   COLOSTOMY REVISION  04/10/2012   Procedure: COLON RESECTION RIGHT;  Surgeon: Kelly CHRISTELLA Spinner, MD;  Location: [facility];  Service: General;;   ESOPHAGOGASTRODUODENOSCOPY  02/14/2012   Procedure: ESOPHAGOGASTRODUODENOSCOPY (EGD);  Surgeon: Kelly JULIANNA Fitz, MD;  Location: [facility];  Service: Endoscopy;  Laterality: N/A;   LAPAROTOMY  04/10/2012   Procedure: EXPLORATORY LAPAROTOMY;  Surgeon: Kelly CHRISTELLA Spinner, MD;  Location: [facility];  Service: General;;   Social History:  reports that she has quit smoking. She has never used smokeless tobacco. She reports that she does not drink alcohol and does not use drugs. Family History:  Family History  Problem Relation Age of Onset   Breast cancer Sister    Breast cancer Sister    Breast cancer Other    Breast cancer Other    Breast cancer Other      HOME MEDICATIONS: Allergies as of 05/24/2024       Reactions   Pioglitazone Other (See Comments)        Medication List        Accurate as of May 24, 2024  8:20 AM. If you have any questions, ask your  nurse or doctor.          acetaminophen  650 MG CR tablet Commonly known as: TYLENOL  1 tablets as needed Orally every 8 hrs   aspirin 325 MG tablet Take 325 mg by mouth daily with breakfast.   atorvastatin  20 MG tablet Commonly known as: LIPITOR Take 1 tablet (20 mg total) by mouth daily.   CALCIUM  600-D PO Take by mouth.   ezetimibe  10 MG tablet Commonly known as: ZETIA  Take 10 mg by mouth daily.   glipiZIDE  5 MG tablet Commonly known as: GLUCOTROL  Take 1 tablet (5 mg total) by mouth 2 (two) times daily before a meal.   hydrOXYzine  25 MG capsule Commonly known as: VISTARIL  TAKE ONE CAPSULE  BY MOUTH EVERY 8 HOURS AS NEEDED   latanoprost 0.005 % ophthalmic solution Commonly known as: XALATAN INSTILL 1 DROP INTO BOTH EYES EVERY DAY AT NIGHT   levocetirizine 5 MG tablet Commonly known as: XYZAL Take 5 mg by mouth at bedtime.   losartan-hydrochlorothiazide  100-12.5 MG tablet Commonly known as: HYZAAR Take 1 tablet by mouth daily.   metFORMIN  1000 MG tablet Commonly known as: GLUCOPHAGE  Take 1 tablet (1,000 mg total) by mouth 2 (two) times daily with a meal.   multivitamin with minerals Tabs tablet Take 1 tablet by mouth daily.   OneTouch Ultra test strip Generic drug: glucose blood 1 each by Other route daily in the afternoon. Use as instructed   pimecrolimus 1 % cream Commonly known as: ELIDEL Apply topically.   telmisartan -hydrochlorothiazide  80-12.5 MG tablet Commonly known as: MICARDIS  HCT Take 1 tablet by mouth daily with breakfast.   triamcinolone ointment 0.1 % Commonly known as: KENALOG APPLY ON THE SKIN NIGHTLY APPLY TO RED ITCHY AREAS, TAPER AS ABLE         OBJECTIVE:   Vital Signs: BP 124/70 (BP Location: Left Arm, Patient Position: Sitting, Cuff Size: Normal)   Pulse 88   Ht 4' 10 (1.473 m)   Wt 147 lb (66.7 kg)   SpO2 96%   BMI 30.72 kg/m   Wt Readings from Last 3 Encounters:  05/24/24 147 lb (66.7 kg)  03/30/24 148 lb (67.1 kg)  09/29/23 145 lb 3.2 oz (65.9 kg)     Exam: General: Pt appears well and is in NAD  Lungs: Clear with good BS bilat   Heart: RRR   Extremities: No pretibial edema.   Neuro: MS is good with appropriate affect, pt is alert and Ox3   DM foot exam: 03/30/2024   The skin of the feet is intact without sores or ulcerations. The pedal pulses are 2+ on right and 2+ on left. The sensation is intact to a screening 5.07, 10 gram monofilament bilaterally    DATA REVIEWED:  Lab Results  Component Value Date   HGBA1C 7.0 (A) 03/30/2024   HGBA1C 6.9 (A) 09/29/2023   HGBA1C 7.6 (A) 03/26/2023    01/19/2024 BUN 18 GFR 88 LDL 67 Tg 84 HDL 77    Latest Reference Range & Units 04/01/24 14:56  Microalb, Ur mg/dL <9.7  MICROALB/CREAT RATIO <30 mg/g creat NOTE  Creatinine, Urine 20 - 275 mg/dL 33     ASSESSMENT / PLAN / RECOMMENDATIONS:   1) Type 2 Diabetes Mellitus,OPtimally controlled, Without complications - Most recent A1c of 7.0 %. Goal A1c < 7.5 %.    -A1c optimal without hypoglycemia -Januvia has been cost prohibitive in the past -Jardiance caused genital rash -Labs through PCPs office were reviewed with normal GFR  MEDICATIONS: Continue Glipizide  5 mg, 1 tablet before Breakfast and 1 tablet before Supper Continue Metformin  1000 mg BID   EDUCATION / INSTRUCTIONS: BG monitoring instructions: Patient is instructed to check her blood sugars 1 times a day, fasting . Call [facility] if: BG persistently < 70 . I reviewed the Rule of 15 for the treatment of hypoglycemia in detail with the patient. Literature supplied.   2) Diabetic complications:  Eye: Does not have known diabetic retinopathy.  Neuro/ Feet: Does not have known diabetic peripheral neuropathy. Renal: Patient does note have known baseline CKD. She is on an ACEI/ARB at present.  3) Dyslipidemia :  -I had switched pravastatin to atorvastatin  03/2023 due to an elevated LDL -LDL and triglycerides have been optimized -Lipid panel reviewed through PCPs office  Medication  Continue atorvastatin  20 mg daily  F/U in 6 months    Signed electronically by: Stefano Redgie Butts, MD  [facility]  [facility] [address] [place], [place] [address] Phone: [phone] FAX: [phone]   CC: Kelly Lenis, MD [address] [place] [address] Phone: [phone]  Fax: [phone]  Return to Endocrinology clinic as below: No future appointments.

## 2024-06-04 DIAGNOSIS — E119 Type 2 diabetes mellitus without complications: Secondary | ICD-10-CM | POA: Diagnosis not present

## 2024-08-24 DIAGNOSIS — M25511 Pain in right shoulder: Secondary | ICD-10-CM | POA: Diagnosis not present

## 2024-08-24 DIAGNOSIS — M85851 Other specified disorders of bone density and structure, right thigh: Secondary | ICD-10-CM | POA: Diagnosis not present

## 2024-08-24 DIAGNOSIS — I10 Essential (primary) hypertension: Secondary | ICD-10-CM | POA: Diagnosis not present

## 2024-08-24 DIAGNOSIS — Z23 Encounter for immunization: Secondary | ICD-10-CM | POA: Diagnosis not present

## 2024-08-24 DIAGNOSIS — E782 Mixed hyperlipidemia: Secondary | ICD-10-CM | POA: Diagnosis not present

## 2024-08-24 DIAGNOSIS — E1169 Type 2 diabetes mellitus with other specified complication: Secondary | ICD-10-CM | POA: Diagnosis not present

## 2024-08-24 DIAGNOSIS — D649 Anemia, unspecified: Secondary | ICD-10-CM | POA: Diagnosis not present

## 2024-08-24 DIAGNOSIS — Z0000 Encounter for general adult medical examination without abnormal findings: Secondary | ICD-10-CM | POA: Diagnosis not present

## 2024-08-24 DIAGNOSIS — M353 Polymyalgia rheumatica: Secondary | ICD-10-CM | POA: Diagnosis not present

## 2024-08-24 DIAGNOSIS — Z9181 History of falling: Secondary | ICD-10-CM | POA: Diagnosis not present

## 2024-08-24 DIAGNOSIS — K219 Gastro-esophageal reflux disease without esophagitis: Secondary | ICD-10-CM | POA: Diagnosis not present

## 2024-11-23 ENCOUNTER — Ambulatory Visit: Admitting: Internal Medicine

## 2024-11-23 NOTE — Progress Notes (Unsigned)
 "   Name: Kelly Farmer  Age/ Sex: 84 y.o., female   MRN/ DOB: [id number], [birth year]     PCP: Seabron Lenis, MD   Reason for Endocrinology Evaluation: Type 2 Diabetes Mellitus  Initial Endocrine Consultative Visit: 01/10/2020    PATIENT IDENTIFIER: [name] is a 84 y.o. female with a past medical history of HTN,T2DM and Dyslipidemia. The patient has followed with Endocrinology clinic since 01/10/2020 for consultative assistance with management of her diabetes.  DIABETIC HISTORY:  [name] was diagnosed with DM in 1970's. Januvia was cost prohibitive. Jardiance caused rash . Her hemoglobin A1c has ranged from 7.2% in 2019, peaking at 8.6% in 2020  On her initial visit to our clinic she had an A1c of 8.0% She was on metformin  only. Glipizide  was added  SUBJECTIVE:   During the last visit (05/24/2024): A1c 7.0%   Today (09/12/2020): [name]  She checks her blood sugars 1 times daily, fasting .No hypoglycemia   The patient has been following up with orthopedics for right shoulder pain Denies nausea, vomiting  Denies constipation or diarrhea  She has had sinus issues    HOME DIABETES REGIMEN:  Metformin  1000 mg, 1 tablet with Breakfast and 1 tablet with supper Glipizide  5 mg, 1 tablet before Breakfast and 1 tablet before Supper Atorvastatin  20 mg daily   METER DOWNLOAD SUMMARY:  BG 139 mg/dL    DIABETIC COMPLICATIONS: Microvascular complications:    Denies: neuropathy, retinopathy and CKD Last eye exam: Completed 03/2024   Macrovascular complications:    Denies: CAD, PVD, CVA   HISTORY:  Past Medical History:  Past Medical History:  Diagnosis Date   Abdominal pain    HOSP AT [facility] 02/13/12  TO  02/21/12 -DISCHARGE DX RUQ ABSCESS-MOST LIKELY GANGRENOUS CHLOLECYSTITIS WITH FISTULA TO THE COLON WITH ABSCESS;  DRAINAGE OF THE ABSCESS AND PLACEMENT OF DRAIN BY IR--PT STILL HAS THE DRAIN  TO DRAINAGE BAG AND IS ON ANTIBIOTIC   Arthritis    Diabetes  mellitus    ORAL MED-NO INSULIN    GERD (gastroesophageal reflux disease)    OCCAS   Hypercholesteremia    Hypertension    Past Surgical History:  Past Surgical History:  Procedure Laterality Date   ABDOMINAL HYSTERECTOMY     APPENDECTOMY     CHOLECYSTECTOMY  04/10/2012   Procedure: CHOLECYSTECTOMY;  Surgeon: Krystal CHRISTELLA Spinner, MD;  Location: [facility];  Service: General;;   COLOSTOMY REVISION  04/10/2012   Procedure: COLON RESECTION RIGHT;  Surgeon: Krystal CHRISTELLA Spinner, MD;  Location: [facility];  Service: General;;   ESOPHAGOGASTRODUODENOSCOPY  02/14/2012   Procedure: ESOPHAGOGASTRODUODENOSCOPY (EGD);  Surgeon: Lesta JULIANNA Fitz, MD;  Location: [facility];  Service: Endoscopy;  Laterality: N/A;   LAPAROTOMY  04/10/2012   Procedure: EXPLORATORY LAPAROTOMY;  Surgeon: Krystal CHRISTELLA Spinner, MD;  Location[facility];  Service: General;;   Social History:  reports that she has quit smoking. She has never used smokeless tobacco. She reports that she does not drink alcohol and does not use drugs. Family History:  Family History  Problem Relation Age of Onset   Breast cancer Sister    Breast cancer Sister    Breast cancer Other    Breast cancer Other    Breast cancer Other      HOME MEDICATIONS: Allergies as of 11/23/2024       Reactions   Pioglitazone Other (See Comments)        Medication List        Accurate as  of November 23, 2024  7:00 AM. If you have any questions, ask your nurse or doctor.          acetaminophen  650 MG CR tablet Commonly known as: TYLENOL  1 tablets as needed Orally every 8 hrs   aspirin 325 MG tablet Take 325 mg by mouth daily with breakfast.   atorvastatin  20 MG tablet Commonly known as: LIPITOR Take 1 tablet (20 mg total) by mouth daily.   CALCIUM  600-D PO Take by mouth.   ezetimibe  10 MG tablet Commonly known as: ZETIA  Take 10 mg by mouth daily.   glipiZIDE  5 MG tablet Commonly known as: GLUCOTROL  Take 1 tablet (5 mg total) by mouth 2 (two) times daily before a  meal.   hydrOXYzine  25 MG capsule Commonly known as: VISTARIL  TAKE ONE CAPSULE BY MOUTH EVERY 8 HOURS AS NEEDED   latanoprost 0.005 % ophthalmic solution Commonly known as: XALATAN INSTILL 1 DROP INTO BOTH EYES EVERY DAY AT NIGHT   levocetirizine 5 MG tablet Commonly known as: XYZAL Take 5 mg by mouth at bedtime.   losartan-hydrochlorothiazide  100-12.5 MG tablet Commonly known as: HYZAAR Take 1 tablet by mouth daily.   metFORMIN  1000 MG tablet Commonly known as: GLUCOPHAGE  Take 1 tablet (1,000 mg total) by mouth 2 (two) times daily with a meal.   multivitamin with minerals Tabs tablet Take 1 tablet by mouth daily.   OneTouch Ultra test strip Generic drug: glucose blood 1 each by Other route daily in the afternoon. Use as instructed   pimecrolimus 1 % cream Commonly known as: ELIDEL Apply topically.   telmisartan -hydrochlorothiazide  80-12.5 MG tablet Commonly known as: MICARDIS  HCT Take 1 tablet by mouth daily with breakfast.   triamcinolone ointment 0.1 % Commonly known as: KENALOG APPLY ON THE SKIN NIGHTLY APPLY TO RED ITCHY AREAS, TAPER AS ABLE         OBJECTIVE:   Vital Signs: There were no vitals taken for this visit.  Wt Readings from Last 3 Encounters:  05/24/24 147 lb (66.7 kg)  03/30/24 148 lb (67.1 kg)  09/29/23 145 lb 3.2 oz (65.9 kg)     Exam: General: Pt appears well and is in NAD  Lungs: Clear with good BS bilat   Heart: RRR   Extremities: No pretibial edema.   Neuro: MS is good with appropriate affect, pt is alert and Ox3   DM foot exam: 03/30/2024   The skin of the feet is intact without sores or ulcerations. The pedal pulses are 2+ on right and 2+ on left. The sensation is intact to a screening 5.07, 10 gram monofilament bilaterally    DATA REVIEWED:  Lab Results  Component Value Date   HGBA1C 7.0 (A) 03/30/2024   HGBA1C 6.9 (A) 09/29/2023   HGBA1C 7.6 (A) 03/26/2023   01/19/2024 BUN 18 GFR 88 LDL 67 Tg 84 HDL 77     Latest Reference Range & Units 04/01/24 14:56  Microalb, Ur mg/dL <9.7  MICROALB/CREAT RATIO <30 mg/g creat NOTE  Creatinine, Urine 20 - 275 mg/dL 33     ASSESSMENT / PLAN / RECOMMENDATIONS:   1) Type 2 Diabetes Mellitus,OPtimally controlled, Without complications - Most recent A1c of 7.0 %. Goal A1c < 7.5 %.    -A1c optimal without hypoglycemia -Januvia has been cost prohibitive in the past -Jardiance caused genital rash -Labs through PCPs office were reviewed with normal GFR   MEDICATIONS: Continue Glipizide  5 mg, 1 tablet before Breakfast and 1 tablet before Supper Continue Metformin  1000  mg BID   EDUCATION / INSTRUCTIONS: BG monitoring instructions: Patient is instructed to check her blood sugars 1 times a day, fasting . Call [place] [facility] if: BG persistently < 70 . I reviewed the Rule of 15 for the treatment of hypoglycemia in detail with the patient. Literature supplied.   2) Diabetic complications:  Eye: Does not have known diabetic retinopathy.  Neuro/ Feet: Does not have known diabetic peripheral neuropathy. Renal: Patient does note have known baseline CKD. She is on an ACEI/ARB at present.  3) Dyslipidemia :  -I had switched pravastatin to atorvastatin  03/2023 due to an elevated LDL -LDL and triglycerides have been optimized -Lipid panel reviewed through PCPs office  Medication  Continue atorvastatin  20 mg daily  F/U in 6 months    Signed electronically by: Stefano Redgie Butts, MD  [facility]  [facility] [address] [place], [place] [address] Phone: [phone] FAX: [phone]   CC: Seabron Lenis, MD [address] [place] [address] Phone: [phone]  Fax: [phone]  Return to Endocrinology clinic as below: Future Appointments  Date Time Provider Department Center  11/23/2024  8:30 AM Brigid Vandekamp, Donell Redgie, MD [id number] None     "
